# Patient Record
Sex: Female | Born: 1945 | Race: White | Hispanic: No | Marital: Married | Smoking: Never smoker
Health system: Southern US, Community
[De-identification: ages and names within clinical notes are randomized; demographics above are authoritative.]

## PROBLEM LIST (undated history)

## (undated) DIAGNOSIS — M1990 Unspecified osteoarthritis, unspecified site: Secondary | ICD-10-CM

## (undated) DIAGNOSIS — Z923 Personal history of irradiation: Secondary | ICD-10-CM

## (undated) DIAGNOSIS — R0600 Dyspnea, unspecified: Secondary | ICD-10-CM

## (undated) DIAGNOSIS — K529 Noninfective gastroenteritis and colitis, unspecified: Secondary | ICD-10-CM

## (undated) DIAGNOSIS — C50919 Malignant neoplasm of unspecified site of unspecified female breast: Secondary | ICD-10-CM

## (undated) DIAGNOSIS — K219 Gastro-esophageal reflux disease without esophagitis: Secondary | ICD-10-CM

## (undated) DIAGNOSIS — M8580 Other specified disorders of bone density and structure, unspecified site: Secondary | ICD-10-CM

## (undated) DIAGNOSIS — K5190 Ulcerative colitis, unspecified, without complications: Secondary | ICD-10-CM

## (undated) DIAGNOSIS — R1310 Dysphagia, unspecified: Secondary | ICD-10-CM

## (undated) DIAGNOSIS — L719 Rosacea, unspecified: Secondary | ICD-10-CM

## (undated) DIAGNOSIS — K222 Esophageal obstruction: Secondary | ICD-10-CM

## (undated) DIAGNOSIS — J189 Pneumonia, unspecified organism: Secondary | ICD-10-CM

## (undated) DIAGNOSIS — Z8742 Personal history of other diseases of the female genital tract: Secondary | ICD-10-CM

## (undated) HISTORY — PX: ESOPHAGEAL DILATION: SHX303

## (undated) HISTORY — DX: Personal history of other diseases of the female genital tract: Z87.42

## (undated) HISTORY — PX: JOINT REPLACEMENT: SHX530

## (undated) HISTORY — DX: Malignant neoplasm of unspecified site of unspecified female breast: C50.919

## (undated) HISTORY — PX: BREAST SURGERY: SHX581

## (undated) HISTORY — PX: EYE SURGERY: SHX253

---

## 1985-10-29 HISTORY — PX: TUBAL LIGATION: SHX77

## 1998-03-21 ENCOUNTER — Other Ambulatory Visit: Admission: RE | Admit: 1998-03-21 | Discharge: 1998-03-21 | Payer: Self-pay | Admitting: Obstetrics and Gynecology

## 1999-06-23 ENCOUNTER — Other Ambulatory Visit: Admission: RE | Admit: 1999-06-23 | Discharge: 1999-06-23 | Payer: Self-pay | Admitting: Obstetrics and Gynecology

## 2000-07-22 ENCOUNTER — Other Ambulatory Visit: Admission: RE | Admit: 2000-07-22 | Discharge: 2000-07-22 | Payer: Self-pay | Admitting: Obstetrics and Gynecology

## 2001-07-24 ENCOUNTER — Other Ambulatory Visit: Admission: RE | Admit: 2001-07-24 | Discharge: 2001-07-24 | Payer: Self-pay | Admitting: Obstetrics and Gynecology

## 2001-09-17 ENCOUNTER — Ambulatory Visit: Admission: RE | Admit: 2001-09-17 | Discharge: 2001-09-17 | Payer: Self-pay | Admitting: Internal Medicine

## 2002-09-07 ENCOUNTER — Other Ambulatory Visit: Admission: RE | Admit: 2002-09-07 | Discharge: 2002-09-07 | Payer: Self-pay | Admitting: Obstetrics and Gynecology

## 2003-03-16 ENCOUNTER — Ambulatory Visit (HOSPITAL_BASED_OUTPATIENT_CLINIC_OR_DEPARTMENT_OTHER): Admission: RE | Admit: 2003-03-16 | Discharge: 2003-03-16 | Payer: Self-pay | Admitting: Otolaryngology

## 2003-09-13 ENCOUNTER — Other Ambulatory Visit: Admission: RE | Admit: 2003-09-13 | Discharge: 2003-09-13 | Payer: Self-pay | Admitting: Obstetrics and Gynecology

## 2005-02-26 ENCOUNTER — Ambulatory Visit: Payer: Self-pay | Admitting: Internal Medicine

## 2005-04-19 ENCOUNTER — Ambulatory Visit: Payer: Self-pay | Admitting: Internal Medicine

## 2012-01-01 ENCOUNTER — Other Ambulatory Visit: Payer: Self-pay | Admitting: Allergy and Immunology

## 2012-01-01 DIAGNOSIS — R1310 Dysphagia, unspecified: Secondary | ICD-10-CM

## 2012-01-03 ENCOUNTER — Other Ambulatory Visit (HOSPITAL_COMMUNITY): Payer: Self-pay

## 2012-01-04 ENCOUNTER — Ambulatory Visit
Admission: RE | Admit: 2012-01-04 | Discharge: 2012-01-04 | Disposition: A | Discharge status: Home / Self Care | Payer: 59 | Source: Ambulatory Visit | Attending: Allergy and Immunology | Admitting: Allergy and Immunology

## 2012-01-07 ENCOUNTER — Other Ambulatory Visit: Payer: Self-pay | Admitting: Allergy and Immunology

## 2012-01-07 DIAGNOSIS — R531 Weakness: Secondary | ICD-10-CM

## 2012-01-08 ENCOUNTER — Other Ambulatory Visit (HOSPITAL_COMMUNITY): Payer: Self-pay | Admitting: Allergy and Immunology

## 2012-01-08 DIAGNOSIS — J189 Pneumonia, unspecified organism: Secondary | ICD-10-CM

## 2012-01-09 ENCOUNTER — Ambulatory Visit (HOSPITAL_COMMUNITY)
Admission: RE | Admit: 2012-01-09 | Discharge: 2012-01-09 | Disposition: A | Discharge status: Home / Self Care | Payer: 59 | Source: Ambulatory Visit | Attending: Allergy and Immunology | Admitting: Allergy and Immunology

## 2012-01-09 DIAGNOSIS — Z79899 Other long term (current) drug therapy: Secondary | ICD-10-CM | POA: Insufficient documentation

## 2012-01-09 DIAGNOSIS — J189 Pneumonia, unspecified organism: Secondary | ICD-10-CM

## 2012-01-09 DIAGNOSIS — R1313 Dysphagia, pharyngeal phase: Secondary | ICD-10-CM | POA: Insufficient documentation

## 2012-01-09 NOTE — Procedures (Signed)
Modified Barium Swallow Procedure Note Patient Details  Name: Nashea Chumney MRN: [id number] Date of Birth: 06-Mar-1946  Today's Date: 01/09/2012 Time:0950  - 1110    Past Medical History: No past medical history on file. Past Surgical History: No past surgical history on file. HPI:  66 yo female referred by Dr Neldon Mc for MBS due to pt aspirating on esophagram and pt complaint of dysphagia.  Pt reports problems swallowing with insidious progression since Dec 2012.  Pt complains of po intake coming back up and reported that she has been told she has persistent pna per CXR last week.  Weight loss of 11 pounds reported since January 2013.  PMH + for asthma (2nd hand smoke exposure and dust from work in [employer] as a child) , colitis, osteopenia, recent pna s/p ABX tx .  Pt also reports being seen by Dr Redmond Baseman with no significant findings from visualization with mirror.  Pt acknowledges taking Omeprazole  (changed from Nexium d/t cost).  Pt complains of excessive mucus production and acknowledges liquids will come back up with mucus when trying to drink.  Pt reportedly tolerates thicker drinks (ie sweet tea) and foods better than thin liquids, ie water.  Pt take small bites/avoids particulate foods.  She has required the heimlich manuever twice in the last two years.  Pt also complained of voice changes, increase hoarseness as day progresses.     Pt denies neurological history nor unilateral weakness.   Pt ambulated fine and had no focal cranial nerve deficits from oral motor exam.      Recommendation/Prognosis  Clinical Impression Dysphagia Diagnosis: Severe pharyngeal phase dysphagia;Mild cervical esophageal phase dysphagia Clinical impression: Pt presents with severe pharyngeal phase dysphagia primarily sensory based (delay) with OVERT SILENT aspiration of all liquids tested (mixed with secretions).  Various postures (chin tuck, head extended) and liquid consistencies tested did not consistently  prevent aspiration or penetration, but chin tuck appeared to decr amount.  Pt was not able to fully clear aspirates due to depth of aspiration.  Pt's pharyngeal swalllow actually appears strong without significant residuals.  Pt did not aspirate or penetrate pudding or cracker, and this appears c/w her report of better tolerance of food.  Her dysphagia appears to be consistent with neurobased issues.   Recommend pt not consume liquids with meals and masticate food thoroughly due to severity of dysphagia.  Consuming several small meals throughout the day may be beneficial as well.  Rec pt consume thin water between meals (*without other drink or food) after oral care to stay hydrated, as lung clearance of plain water will be better than thickened drinks or other liquids.  Unfortuantely pt's silent aspiration makes it difficult to assess her tolerance of po intake.  Pt is also taking only 1/2 tsp size bites due to her dysphagia, which raises continued concern for her to maintain her weight.   Pt stated it takes her an hour to eat a meal.   Pt stated she is scheduled for an endo on Monday and brain MRI on Tuesday of next week.  Of note, as testing progressed pt appeared with increased vocal hoarseness, presumably from aspiration and cued coughing.  SLP advised pt that family should know Heimlich manuever for emergent use.  Depending on source of dysphagia, weight loss, recurrent pnas, SLP questions if pt may benefit from consideration for alternative means of nutrition with supplemental po.  Thanks for this referral.    Swallow Evaluation Recommendations General Recommendation: Free water protocol after oral  care Solid Consistency: Dysphagia 3 (Mechanical soft) (no liquids with meals) Supervision: Patient able to self feed Compensations: Small sips/bites;Slow rate Postural Changes and/or Swallow Maneuvers: Upright 30-60 min after meal;Chin tuck Oral Care Recommendations: Oral care QID (pt aspirating secretions  currently)   Individuals Consulted Consulted and Agree with Results and Recommendations: Patient Report Sent to : Referring physician;Other (comment) (pt requested Dr Marigene Ehlers Lyndel Safe receive a copy)   General:  Date of Onset: 10/11/11 HPI: 66 yo female referred by Dr Neldon Mc for MBS due to pt aspirating on esophagram and pt complaint of dysphagia.  Pt reports problems swallowing with insidious progression since Dec 2012.  Pt complains of po intake coming back up and reported that she has been told she has persistent pna per CXR last week.  Weight loss of 11 pounds reported since January 2013.  PMH + for asthma (2nd hand smoke exposure and dust from work in [employer] as a child) , colitis, osteopenia, recent pna s/p ABX tx .  Pt also reports being seen by Dr Redmond Baseman with no significant findings from visualization with mirror.  Pt acknowledges taking Omeprazole  (changed from Nexium d/t cost).  Pt complains of excessive mucus production and acknowledges liquids will come back up with mucus when trying to drink.  Pt reportedly tolerates thicker drinks (ie sweet tea) and foods better than thin liquids, ie water.  Pt take small bites/avoids particulate foods.  She has required the heimlich manuever twice in the last two years.  Pt also complained of voice changes, increase hoarseness as day progresses.     Type of Study: Initial MBS Diet Prior to this Study: Other (Comment);Regular (avoiding particulate foods) Respiratory Status: Room air Behavior/Cognition: Alert;Cooperative Oral Cavity - Dentition: Adequate natural dentition Oral Motor / Sensory Function: Within functional limits Vision: Functional for self-feeding Baseline Vocal Quality: Hoarse Volitional Cough: Strong Volitional Swallow: Unable to elicit (xerostomic)  Reason for Referral:  Aspiration on barium swallow  Oral Phase Oral Preparation/Oral Phase Oral Phase: WFL Pharyngeal Phase  Pharyngeal Phase Pharyngeal Phase:  Impaired Pharyngeal - Honey Pharyngeal - Honey Cup: Delayed swallow initiation;Penetration/Aspiration during swallow;Penetration/Aspiration before swallow Penetration/Aspiration details (honey cup): Material enters airway, passes BELOW cords without attempt by patient to eject out (silent aspiration) Pharyngeal - Nectar Pharyngeal - Nectar Cup: Delayed swallow initiation;Penetration/Aspiration during swallow;Penetration/Aspiration before swallow Penetration/Aspiration details (nectar cup): Material enters airway, passes BELOW cords without attempt by patient to eject out (silent aspiration) Pharyngeal - Thin Pharyngeal - Thin Teaspoon: Delayed swallow initiation;Penetration/Aspiration before swallow;Penetration/Aspiration during swallow Penetration/Aspiration details (thin teaspoon): Material enters airway, passes BELOW cords without attempt by patient to eject out (silent aspiration) Pharyngeal - Thin Cup: Delayed swallow initiation;Penetration/Aspiration before swallow;Penetration/Aspiration during swallow Penetration/Aspiration details (thin cup): Material enters airway, passes BELOW cords without attempt by patient to eject out (silent aspiration) Pharyngeal - Thin Straw: Delayed swallow initiation;Penetration/Aspiration before swallow;Penetration/Aspiration during swallow Penetration/Aspiration details (thin straw): Material enters airway, passes BELOW cords without attempt by patient to eject out (silent aspiration) Pharyngeal - Solids Pharyngeal - Puree: Delayed swallow initiation;Premature spillage to valleculae;Compensatory strategies attempted (Comment) (pt took 1/2 tsp bolus size only) Pharyngeal - Regular: Delayed swallow initiation;Premature spillage to valleculae;Compensatory strategies attempted (Comment) (hesitation initiation with bolus spilling over epiglottis) Pharyngeal Phase - Comment Pharyngeal Comment: chin tuck posture did not consistently prevent aspiration or penetration,  inconsistently appeared to decr amount of aspiration, head elevation allowed posterior tracheal aspiration,  supraglottic swallow not attempted d/t pt's asthma Cervical Esophageal Phase  Cervical Esophageal Phase Cervical  Esophageal Phase: Impaired Cervical Esophageal Phase - Honey Honey Teaspoon: Prominent cricopharyngeal segment Cervical Esophageal Phase - Nectar Nectar Cup: Prominent cricopharyngeal segment Cervical Esophageal Phase - Thin Thin Teaspoon: Prominent cricopharyngeal segment Thin Cup: Prominent cricopharyngeal segment Thin Straw: Prominent cricopharyngeal segment Cervical Esophageal Phase - Solids Puree: Prominent cricopharyngeal segment Regular: Prominent cricopharyngeal segment Cervical Esophageal Phase - Comment Cervical Esophageal Comment: appearance of prominent cricophayrngeus with minimal amount of barium below UES (with pudding/cracker) WITHOUT pt awareness, liquid bolus consumption appeared to aid clearance but with overt aspiration of liquids   Luanna Salk, [facility] SLP [phone]

## 2012-01-15 ENCOUNTER — Ambulatory Visit
Admission: RE | Admit: 2012-01-15 | Discharge: 2012-01-15 | Disposition: A | Discharge status: Home / Self Care | Payer: 59 | Source: Ambulatory Visit | Attending: Allergy and Immunology | Admitting: Allergy and Immunology

## 2012-01-15 DIAGNOSIS — R531 Weakness: Secondary | ICD-10-CM

## 2012-01-15 MED ORDER — GADOBENATE DIMEGLUMINE 529 MG/ML IV SOLN
10.0000 mL | Freq: Once | INTRAVENOUS | Status: AC | PRN
  Administered 2012-01-15: 10 mL via INTRAVENOUS

## 2012-01-24 ENCOUNTER — Other Ambulatory Visit: Payer: Self-pay | Admitting: Neurosurgery

## 2012-01-31 ENCOUNTER — Encounter (HOSPITAL_COMMUNITY): Payer: Self-pay | Admitting: Pharmacy Technician

## 2012-02-04 MED ORDER — DEXAMETHASONE SODIUM PHOSPHATE 10 MG/ML IJ SOLN
INTRAMUSCULAR | Status: AC
  Filled 2012-02-04: qty 1

## 2012-02-06 ENCOUNTER — Encounter (HOSPITAL_COMMUNITY)
Admission: RE | Admit: 2012-02-06 | Discharge: 2012-02-06 | Disposition: A | Discharge status: Home / Self Care | Payer: 59 | Source: Ambulatory Visit | Attending: Neurosurgery | Admitting: Neurosurgery

## 2012-02-06 ENCOUNTER — Encounter (HOSPITAL_COMMUNITY)
Admission: RE | Admit: 2012-02-06 | Discharge: 2012-02-06 | Disposition: A | Discharge status: Home / Self Care | Payer: 59 | Source: Ambulatory Visit | Attending: Anesthesiology | Admitting: Anesthesiology

## 2012-02-06 ENCOUNTER — Encounter (HOSPITAL_COMMUNITY): Payer: Self-pay

## 2012-02-06 HISTORY — DX: Esophageal obstruction: K22.2

## 2012-02-06 HISTORY — DX: Pneumonia, unspecified organism: J18.9

## 2012-02-06 HISTORY — DX: Gastro-esophageal reflux disease without esophagitis: K21.9

## 2012-02-06 HISTORY — DX: Other specified disorders of bone density and structure, unspecified site: M85.80

## 2012-02-06 HISTORY — DX: Noninfective gastroenteritis and colitis, unspecified: K52.9

## 2012-02-06 LAB — CBC
Platelets: 256 10*3/uL (ref 150–400)
RBC: 4.73 MIL/uL (ref 3.87–5.11)
RDW: 13.8 % (ref 11.5–15.5)
WBC: 7.2 10*3/uL (ref 4.0–10.5)

## 2012-02-06 LAB — BASIC METABOLIC PANEL
Calcium: 9.4 mg/dL (ref 8.4–10.5)
Creatinine, Ser: 0.66 mg/dL (ref 0.50–1.10)
GFR calc Af Amer: >90 mL/min (ref >90)
GFR calc non Af Amer: >90 mL/min (ref >90)
Sodium: 137 mEq/L (ref 135–145)

## 2012-02-06 LAB — TYPE AND SCREEN
ABO/RH(D): O POS
Antibody Screen: NEGATIVE

## 2012-02-06 LAB — SURGICAL PCR SCREEN: MRSA, PCR: NEGATIVE

## 2012-02-06 LAB — ABO/RH: ABO/RH(D): O POS

## 2012-02-06 NOTE — Pre-Procedure Instructions (Signed)
[place]  02/06/2012   Your procedure is scheduled on:  April 17  Report to [place] at 0830 AM.  Call this number if you have problems the morning of surgery: [phone]   Remember:   Do not eat food:After Midnight.  May have clear liquids: up to 4 Hours before arrival.  Clear liquids include soda, tea, black coffee, apple or grape juice, broth.  Take these medicines the morning of surgery with A SIP OF WATER: Xopenex (if needed), omeprazole, Dulera   Stop Calcium with Vitamin D, Vitamin D, Multiple Vitamins, and Vitamin E after today  Do not wear jewelry, make-up or nail polish.  Do not wear lotions, powders, or perfumes. You may wear deodorant.  Do not shave 48 hours prior to surgery.  Do not bring valuables to the hospital.  Contacts, dentures or bridgework may not be worn into surgery.  Leave suitcase in the car. After surgery it may be brought to your room.  For patients admitted to the hospital, checkout time is 11:00 AM the day of discharge.   Special Instructions: CHG Shower Use Special Wash: 1/2 bottle night before surgery and 1/2 bottle morning of surgery.   Please read over the following fact sheets that you were given: Pain Booklet, Coughing and Deep Breathing, Blood Transfusion Information and Surgical Site Infection Prevention

## 2012-02-12 MED ORDER — VANCOMYCIN HCL IN DEXTROSE 1-5 GM/200ML-% IV SOLN
1000.0000 mg | INTRAVENOUS | Status: AC
  Administered 2012-02-13: 1000 mg via INTRAVENOUS
  Filled 2012-02-12: qty 200

## 2012-02-12 NOTE — Progress Notes (Signed)
Spoke with Dr. Bruna Potter office regarding obtaining records from office visit on 02/12/12 they report that it will not be back in the office for two weeks

## 2012-02-12 NOTE — Progress Notes (Addendum)
1628pm..Marland KitchenTuesday..  Saw note on front of chart to call Dr. Bruna Potter office for office note.  His transcription will not be ready for 3 days... He will send a written note to 'clearance'  To  [phone].... PLEASE CHECK THE ABOVE FAX..  I TRIED THIS AFTERNOON 1640 AND NOTHING HAS COME YET

## 2012-02-13 ENCOUNTER — Encounter (HOSPITAL_COMMUNITY): Payer: Self-pay | Admitting: Certified Registered Nurse Anesthetist

## 2012-02-13 ENCOUNTER — Encounter (HOSPITAL_COMMUNITY): Payer: Self-pay | Admitting: Anesthesiology

## 2012-02-13 ENCOUNTER — Encounter (HOSPITAL_COMMUNITY)
Admission: RE | Disposition: A | Discharge status: Home / Self Care | Payer: Self-pay | Source: Ambulatory Visit | Attending: Neurosurgery

## 2012-02-13 ENCOUNTER — Ambulatory Visit (HOSPITAL_COMMUNITY): Payer: 59 | Admitting: Anesthesiology

## 2012-02-13 ENCOUNTER — Inpatient Hospital Stay (HOSPITAL_COMMUNITY)
Admission: RE | Admit: 2012-02-13 | Discharge: 2012-02-16 | DRG: 030 | Disposition: A | Discharge status: Home / Self Care | Payer: 59 | Source: Ambulatory Visit | Attending: Neurosurgery | Admitting: Neurosurgery

## 2012-02-13 ENCOUNTER — Encounter (HOSPITAL_COMMUNITY): Payer: Self-pay | Admitting: *Deleted

## 2012-02-13 DIAGNOSIS — M899 Disorder of bone, unspecified: Secondary | ICD-10-CM | POA: Diagnosis present

## 2012-02-13 DIAGNOSIS — Z01812 Encounter for preprocedural laboratory examination: Secondary | ICD-10-CM

## 2012-02-13 DIAGNOSIS — J45909 Unspecified asthma, uncomplicated: Secondary | ICD-10-CM | POA: Diagnosis present

## 2012-02-13 DIAGNOSIS — K219 Gastro-esophageal reflux disease without esophagitis: Secondary | ICD-10-CM | POA: Diagnosis present

## 2012-02-13 DIAGNOSIS — G935 Compression of brain: Principal | ICD-10-CM | POA: Diagnosis present

## 2012-02-13 DIAGNOSIS — R1310 Dysphagia, unspecified: Secondary | ICD-10-CM | POA: Diagnosis present

## 2012-02-13 DIAGNOSIS — Z01818 Encounter for other preprocedural examination: Secondary | ICD-10-CM

## 2012-02-13 HISTORY — PX: SUBOCCIPITAL CRANIECTOMY CERVICAL LAMINECTOMY: SHX5404

## 2012-02-13 SURGERY — SUBOCCIPITAL CRANIECTOMY CERVICAL LAMINECTOMY/DURAPLASTY
Anesthesia: General | Wound class: Clean

## 2012-02-13 MED ORDER — MORPHINE SULFATE 2 MG/ML IJ SOLN: 0.0500 mg/kg | INTRAMUSCULAR | Status: DC | PRN

## 2012-02-13 MED ORDER — PANTOPRAZOLE SODIUM 40 MG IV SOLR
40.0000 mg | Freq: Every day | INTRAVENOUS | Status: DC
  Filled 2012-02-13 (×2): qty 40

## 2012-02-13 MED ORDER — MORPHINE SULFATE (PF) 1 MG/ML IV SOLN
INTRAVENOUS | Status: DC
  Administered 2012-02-13: 25 mg via INTRAVENOUS
  Administered 2012-02-13: 5 mg via INTRAVENOUS
  Administered 2012-02-13: 3 mg via INTRAVENOUS
  Administered 2012-02-14: 25 mg via INTRAVENOUS
  Administered 2012-02-14: 4.5 mg via INTRAVENOUS

## 2012-02-13 MED ORDER — PANTOPRAZOLE SODIUM 40 MG PO TBEC: 80.0000 mg | DELAYED_RELEASE_TABLET | Freq: Every day | ORAL | Status: DC

## 2012-02-13 MED ORDER — LIDOCAINE HCL (CARDIAC) 20 MG/ML IV SOLN
INTRAVENOUS | Status: DC | PRN
  Administered 2012-02-13: 80 mg via INTRAVENOUS

## 2012-02-13 MED ORDER — MORPHINE SULFATE (PF) 1 MG/ML IV SOLN
INTRAVENOUS | Status: AC
  Administered 2012-02-13: 15:00:00
  Filled 2012-02-13: qty 25

## 2012-02-13 MED ORDER — 0.9 % SODIUM CHLORIDE (POUR BTL) OPTIME
TOPICAL | Status: DC | PRN
  Administered 2012-02-13: 1000 mL

## 2012-02-13 MED ORDER — PHENOL 1.4 % MT LIQD: 1.0000 | OROMUCOSAL | Status: DC | PRN

## 2012-02-13 MED ORDER — PROPOFOL 10 MG/ML IV EMUL
INTRAVENOUS | Status: DC | PRN
  Administered 2012-02-13: 170 mg via INTRAVENOUS
  Administered 2012-02-13: 30 mg via INTRAVENOUS

## 2012-02-13 MED ORDER — FAMOTIDINE 20 MG PO TABS: 20.0000 mg | ORAL_TABLET | Freq: Every day | ORAL | Status: DC

## 2012-02-13 MED ORDER — DEXAMETHASONE SODIUM PHOSPHATE 10 MG/ML IJ SOLN
INTRAMUSCULAR | Status: AC
  Administered 2012-02-13: 10 mg via INTRAVENOUS
  Filled 2012-02-13: qty 1

## 2012-02-13 MED ORDER — ACETAMINOPHEN 325 MG PO TABS: 650.0000 mg | ORAL_TABLET | ORAL | Status: DC | PRN

## 2012-02-13 MED ORDER — ONDANSETRON HCL 4 MG/2ML IJ SOLN
4.0000 mg | Freq: Once | INTRAMUSCULAR | Status: DC | PRN
Start: 2012-02-13 — End: 2012-02-13

## 2012-02-13 MED ORDER — ONDANSETRON HCL 4 MG/2ML IJ SOLN: 4.0000 mg | Freq: Four times a day (QID) | INTRAMUSCULAR | Status: DC | PRN

## 2012-02-13 MED ORDER — MOMETASONE FURO-FORMOTEROL FUM 100-5 MCG/ACT IN AERO
2.0000 | INHALATION_SPRAY | Freq: Two times a day (BID) | RESPIRATORY_TRACT | Status: DC
  Administered 2012-02-13 – 2012-02-15 (×4): 2 via RESPIRATORY_TRACT
  Filled 2012-02-13: qty 13

## 2012-02-13 MED ORDER — ONDANSETRON HCL 4 MG/2ML IJ SOLN
4.0000 mg | INTRAMUSCULAR | Status: DC | PRN
  Administered 2012-02-13 – 2012-02-15 (×3): 4 mg via INTRAVENOUS
  Filled 2012-02-13 (×3): qty 2

## 2012-02-13 MED ORDER — DOCUSATE SODIUM 100 MG PO CAPS
100.0000 mg | ORAL_CAPSULE | Freq: Two times a day (BID) | ORAL | Status: DC
  Administered 2012-02-13 – 2012-02-16 (×7): 100 mg via ORAL
  Filled 2012-02-13 (×7): qty 1

## 2012-02-13 MED ORDER — BUPIVACAINE-EPINEPHRINE 0.5% -1:200000 IJ SOLN
INTRAMUSCULAR | Status: DC | PRN
  Administered 2012-02-13: 10 mL

## 2012-02-13 MED ORDER — MORPHINE SULFATE (PF) 1 MG/ML IV SOLN
INTRAVENOUS | Status: AC
  Administered 2012-02-13: 25 mg via INTRAVENOUS
  Filled 2012-02-13: qty 25

## 2012-02-13 MED ORDER — TAB-A-VITE/IRON PO TABS
1.0000 | ORAL_TABLET | Freq: Every day | ORAL | Status: DC
  Administered 2012-02-13 – 2012-02-16 (×4): 1 via ORAL
  Filled 2012-02-13 (×4): qty 1

## 2012-02-13 MED ORDER — OXYCODONE-ACETAMINOPHEN 5-325 MG PO TABS
1.0000 | ORAL_TABLET | ORAL | Status: DC | PRN
  Administered 2012-02-16: 2 via ORAL
  Filled 2012-02-13: qty 2

## 2012-02-13 MED ORDER — NEOSTIGMINE METHYLSULFATE 1 MG/ML IJ SOLN
INTRAMUSCULAR | Status: DC | PRN
  Administered 2012-02-13: 3 mg via INTRAVENOUS

## 2012-02-13 MED ORDER — VANCOMYCIN HCL IN DEXTROSE 1-5 GM/200ML-% IV SOLN
1000.0000 mg | Freq: Once | INTRAVENOUS | Status: AC
  Administered 2012-02-14: 1000 mg via INTRAVENOUS
  Filled 2012-02-13: qty 200

## 2012-02-13 MED ORDER — NALOXONE HCL 0.4 MG/ML IJ SOLN: 0.4000 mg | INTRAMUSCULAR | Status: DC | PRN

## 2012-02-13 MED ORDER — ROCURONIUM BROMIDE 100 MG/10ML IV SOLN
INTRAVENOUS | Status: DC | PRN
  Administered 2012-02-13: 20 mg via INTRAVENOUS
  Administered 2012-02-13: 50 mg via INTRAVENOUS

## 2012-02-13 MED ORDER — SODIUM CHLORIDE 0.9 % IV SOLN
INTRAVENOUS | Status: AC
  Filled 2012-02-13: qty 500

## 2012-02-13 MED ORDER — MONTELUKAST SODIUM 10 MG PO TABS
10.0000 mg | ORAL_TABLET | Freq: Every day | ORAL | Status: DC
  Administered 2012-02-13 – 2012-02-15 (×3): 10 mg via ORAL
  Filled 2012-02-13 (×4): qty 1

## 2012-02-13 MED ORDER — HYDROMORPHONE HCL PF 1 MG/ML IJ SOLN: 0.2500 mg | INTRAMUSCULAR | Status: DC | PRN

## 2012-02-13 MED ORDER — HYDROCODONE-ACETAMINOPHEN 5-325 MG PO TABS
1.0000 | ORAL_TABLET | ORAL | Status: DC | PRN
  Administered 2012-02-14 – 2012-02-15 (×3): 1 via ORAL
  Administered 2012-02-15: 2 via ORAL
  Administered 2012-02-15 (×2): 1 via ORAL
  Administered 2012-02-16: 2 via ORAL
  Filled 2012-02-13 (×3): qty 1
  Filled 2012-02-13: qty 2
  Filled 2012-02-13 (×2): qty 1
  Filled 2012-02-13: qty 2

## 2012-02-13 MED ORDER — BACITRACIN ZINC 500 UNIT/GM EX OINT
TOPICAL_OINTMENT | CUTANEOUS | Status: DC | PRN
  Administered 2012-02-13: 1 via TOPICAL

## 2012-02-13 MED ORDER — LACTATED RINGERS IV SOLN
INTRAVENOUS | Status: DC
  Administered 2012-02-14: 04:00:00 via INTRAVENOUS

## 2012-02-13 MED ORDER — FENTANYL CITRATE 0.05 MG/ML IJ SOLN
INTRAMUSCULAR | Status: DC | PRN
  Administered 2012-02-13: 100 ug via INTRAVENOUS
  Administered 2012-02-13 (×2): 50 ug via INTRAVENOUS

## 2012-02-13 MED ORDER — CEFAZOLIN SODIUM-DEXTROSE 2-3 GM-% IV SOLR
2.0000 g | Freq: Three times a day (TID) | INTRAVENOUS | Status: DC
  Filled 2012-02-13 (×2): qty 50

## 2012-02-13 MED ORDER — PHENYLEPHRINE HCL 10 MG/ML IJ SOLN
INTRAMUSCULAR | Status: DC | PRN
  Administered 2012-02-13 (×2): 80 ug via INTRAVENOUS

## 2012-02-13 MED ORDER — PHENYLEPHRINE HCL 10 MG/ML IJ SOLN
10.0000 mg | INTRAVENOUS | Status: DC | PRN
  Administered 2012-02-13: 25 ug/min via INTRAVENOUS

## 2012-02-13 MED ORDER — DIPHENHYDRAMINE HCL 12.5 MG/5ML PO ELIX
12.5000 mg | ORAL_SOLUTION | Freq: Four times a day (QID) | ORAL | Status: DC | PRN
  Filled 2012-02-13: qty 5

## 2012-02-13 MED ORDER — DIPHENHYDRAMINE HCL 50 MG/ML IJ SOLN: 12.5000 mg | Freq: Four times a day (QID) | INTRAMUSCULAR | Status: DC | PRN

## 2012-02-13 MED ORDER — SODIUM CHLORIDE 0.9 % IR SOLN
Status: DC | PRN
  Administered 2012-02-13: 12:00:00

## 2012-02-13 MED ORDER — THROMBIN 20000 UNITS EX KIT
PACK | CUTANEOUS | Status: DC | PRN
  Administered 2012-02-13: 12:00:00 via TOPICAL

## 2012-02-13 MED ORDER — BACITRACIN 50000 UNITS IM SOLR
INTRAMUSCULAR | Status: AC
  Filled 2012-02-13: qty 1

## 2012-02-13 MED ORDER — SODIUM CHLORIDE 0.9 % IJ SOLN: 9.0000 mL | INTRAMUSCULAR | Status: DC | PRN

## 2012-02-13 MED ORDER — SODIUM CHLORIDE 0.9 % IV SOLN
INTRAVENOUS | Status: DC | PRN
  Administered 2012-02-13 (×2): via INTRAVENOUS

## 2012-02-13 MED ORDER — ACETAMINOPHEN 650 MG RE SUPP: 650.0000 mg | RECTAL | Status: DC | PRN

## 2012-02-13 MED ORDER — ALUM & MAG HYDROXIDE-SIMETH 200-200-20 MG/5ML PO SUSP: 30.0000 mL | Freq: Four times a day (QID) | ORAL | Status: DC | PRN

## 2012-02-13 MED ORDER — ONDANSETRON HCL 4 MG/2ML IJ SOLN
INTRAMUSCULAR | Status: DC | PRN
  Administered 2012-02-13: 4 mg via INTRAVENOUS

## 2012-02-13 MED ORDER — SODIUM CHLORIDE 0.9 % IV SOLN
INTRAVENOUS | Status: DC | PRN
  Administered 2012-02-13 (×2): via INTRAVENOUS

## 2012-02-13 MED ORDER — ZOLPIDEM TARTRATE 10 MG PO TABS
10.0000 mg | ORAL_TABLET | Freq: Every evening | ORAL | Status: DC | PRN
  Administered 2012-02-13: 10 mg via ORAL
  Filled 2012-02-13 (×2): qty 2

## 2012-02-13 MED ORDER — MENTHOL 3 MG MT LOZG: 1.0000 | LOZENGE | OROMUCOSAL | Status: DC | PRN

## 2012-02-13 MED ORDER — LEVALBUTEROL TARTRATE 45 MCG/ACT IN AERO
1.0000 | INHALATION_SPRAY | RESPIRATORY_TRACT | Status: DC | PRN
  Filled 2012-02-13: qty 15

## 2012-02-13 MED ORDER — MESALAMINE 1.2 G PO TBEC
1200.0000 mg | DELAYED_RELEASE_TABLET | Freq: Every day | ORAL | Status: DC
  Filled 2012-02-13 (×2): qty 1

## 2012-02-13 MED ORDER — GLYCOPYRROLATE 0.2 MG/ML IJ SOLN
INTRAMUSCULAR | Status: DC | PRN
  Administered 2012-02-13: 0.4 mg via INTRAVENOUS

## 2012-02-13 SURGICAL SUPPLY — 80 items
APL SKNCLS STERI-STRIP NONHPOA (GAUZE/BANDAGES/DRESSINGS) ×2
BAG DECANTER FOR FLEXI CONT (MISCELLANEOUS) ×2 IMPLANT
BENZOIN TINCTURE PRP APPL 2/3 (GAUZE/BANDAGES/DRESSINGS) ×4 IMPLANT
BIT DRILL NEURO 2X3.1 SFT TUCH (MISCELLANEOUS) IMPLANT
BLADE CLIPPER SURG NEURO (BLADE) ×3 IMPLANT
BLADE SURG 15 STRL LF DISP TIS (BLADE) ×1 IMPLANT
BLADE SURG 15 STRL SS (BLADE) ×2
BLADE ULTRA TIP 2M (BLADE) IMPLANT
BRUSH SCRUB EZ 1% IODOPHOR (MISCELLANEOUS) ×1 IMPLANT
BRUSH SCRUB EZ PLAIN DRY (MISCELLANEOUS) ×2 IMPLANT
BUR ACORN 6.0 PRECISION (BURR) ×2 IMPLANT
CANISTER SUCTION 2500CC (MISCELLANEOUS) ×2 IMPLANT
CLIP TI MEDIUM 6 (CLIP) IMPLANT
CLOTH BEACON ORANGE TIMEOUT ST (SAFETY) ×2 IMPLANT
CONT SPEC 4OZ CLIKSEAL STRL BL (MISCELLANEOUS) ×2 IMPLANT
CORDS BIPOLAR (ELECTRODE) ×2 IMPLANT
COVER MAYO STAND STRL (DRAPES) ×2 IMPLANT
DRAIN SNY WOU 7FLT (WOUND CARE) IMPLANT
DRAPE LAPAROTOMY 100X72 PEDS (DRAPES) ×2 IMPLANT
DRAPE MICROSCOPE ZEISS OPMI (DRAPES) IMPLANT
DRAPE SURG 17X23 STRL (DRAPES) ×4 IMPLANT
DRAPE WARM FLUID 44X44 (DRAPE) ×1 IMPLANT
DRESSING TELFA 8X3 (GAUZE/BANDAGES/DRESSINGS) ×1 IMPLANT
DRILL NEURO 2X3.1 SOFT TOUCH (MISCELLANEOUS) ×2
DURAGUARD 04CMX04CM ×1 IMPLANT
DURASEAL SPINE SEALANT 3ML (MISCELLANEOUS) ×1 IMPLANT
ELECT CAUTERY BLADE 6.4 (BLADE) ×3 IMPLANT
ELECT REM PT RETURN 9FT ADLT (ELECTROSURGICAL) ×2
ELECTRODE REM PT RTRN 9FT ADLT (ELECTROSURGICAL) ×1 IMPLANT
EVACUATOR 1/8 PVC DRAIN (DRAIN) IMPLANT
EVACUATOR SILICONE 100CC (DRAIN) IMPLANT
GAUZE SPONGE 4X4 16PLY XRAY LF (GAUZE/BANDAGES/DRESSINGS) IMPLANT
GLOVE BIO SURGEON STRL SZ8.5 (GLOVE) ×2 IMPLANT
GLOVE BIOGEL PI IND STRL 7.5 (GLOVE) IMPLANT
GLOVE BIOGEL PI IND STRL 8 (GLOVE) IMPLANT
GLOVE BIOGEL PI IND STRL 8.5 (GLOVE) IMPLANT
GLOVE BIOGEL PI INDICATOR 7.5 (GLOVE) ×1
GLOVE BIOGEL PI INDICATOR 8 (GLOVE) ×1
GLOVE BIOGEL PI INDICATOR 8.5 (GLOVE) ×2
GLOVE ECLIPSE 7.5 STRL STRAW (GLOVE) ×4 IMPLANT
GLOVE EXAM NITRILE LRG STRL (GLOVE) IMPLANT
GLOVE EXAM NITRILE MD LF STRL (GLOVE) ×4 IMPLANT
GLOVE EXAM NITRILE XL STR (GLOVE) IMPLANT
GLOVE EXAM NITRILE XS STR PU (GLOVE) IMPLANT
GLOVE SS BIOGEL STRL SZ 8 (GLOVE) ×1 IMPLANT
GLOVE SUPERSENSE BIOGEL SZ 8 (GLOVE) ×1
GLOVE SURG SS PI 8.0 STRL IVOR (GLOVE) ×3 IMPLANT
GOWN BRE IMP SLV AUR LG STRL (GOWN DISPOSABLE) IMPLANT
GOWN BRE IMP SLV AUR XL STRL (GOWN DISPOSABLE) ×2 IMPLANT
GOWN STRL REIN 2XL LVL4 (GOWN DISPOSABLE) ×4 IMPLANT
KIT BASIN OR (CUSTOM PROCEDURE TRAY) ×2 IMPLANT
KIT ROOM TURNOVER OR (KITS) ×2 IMPLANT
MARKER SKIN DUAL TIP RULER LAB (MISCELLANEOUS) ×2 IMPLANT
NEEDLE HYPO 22GX1.5 SAFETY (NEEDLE) ×2 IMPLANT
NS IRRIG 1000ML POUR BTL (IV SOLUTION) ×2 IMPLANT
PACK CRANIOTOMY (CUSTOM PROCEDURE TRAY) ×2 IMPLANT
PAD ARMBOARD 7.5X6 YLW CONV (MISCELLANEOUS) ×6 IMPLANT
PATTIES SURGICAL 1/4 X 3 (GAUZE/BANDAGES/DRESSINGS) IMPLANT
PENCIL BUTTON HOLSTER BLD 10FT (ELECTRODE) ×1 IMPLANT
RUBBERBAND STERILE (MISCELLANEOUS) IMPLANT
SPONGE GAUZE 4X4 12PLY (GAUZE/BANDAGES/DRESSINGS) ×2 IMPLANT
SPONGE LAP 4X18 X RAY DECT (DISPOSABLE) ×1 IMPLANT
STAPLER SKIN PROX WIDE 3.9 (STAPLE) ×2 IMPLANT
STRIP CLOSURE SKIN 1/2X4 (GAUZE/BANDAGES/DRESSINGS) ×2 IMPLANT
SUT ETHILON 2 0 FS 18 (SUTURE) IMPLANT
SUT ETHILON 3 0 FSL (SUTURE) IMPLANT
SUT NURALON 4 0 TR CR/8 (SUTURE) ×4 IMPLANT
SUT PROLENE 6 0 BV (SUTURE) ×7 IMPLANT
SUT VIC AB 1 CT1 18XBRD ANBCTR (SUTURE) ×1 IMPLANT
SUT VIC AB 1 CT1 8-18 (SUTURE) ×2
SUT VIC AB 2-0 CP2 18 (SUTURE) ×3 IMPLANT
SUT VIC AB 3-0 SH 8-18 (SUTURE) ×2 IMPLANT
SYR 20ML ECCENTRIC (SYRINGE) ×2 IMPLANT
SYR CONTROL 10ML LL (SYRINGE) ×2 IMPLANT
TAPE CLOTH SURG 4X10 WHT LF (GAUZE/BANDAGES/DRESSINGS) ×1 IMPLANT
TOWEL OR 17X24 6PK STRL BLUE (TOWEL DISPOSABLE) ×2 IMPLANT
TOWEL OR 17X26 10 PK STRL BLUE (TOWEL DISPOSABLE) ×2 IMPLANT
TRAY FOLEY CATH 14FRSI W/METER (CATHETERS) ×1 IMPLANT
UNDERPAD 30X30 INCONTINENT (UNDERPADS AND DIAPERS) IMPLANT
WATER STERILE IRR 1000ML POUR (IV SOLUTION) ×2 IMPLANT

## 2012-02-13 NOTE — Anesthesia Postprocedure Evaluation (Signed)
  Anesthesia Post-op Note  Patient: Cindy Arias  Procedure(s) Performed: Procedure(s) (LRB): SUBOCCIPITAL CRANIECTOMY CERVICAL LAMINECTOMY/DURAPLASTY (N/A)  Patient Location: PACU  Anesthesia Type: General  Level of Consciousness: awake, alert  and oriented  Airway and Oxygen Therapy: Patient Spontanous Breathing and Patient connected to nasal cannula oxygen  Post-op Pain: mild  Post-op Assessment: Post-op Vital signs reviewed, Patient's Cardiovascular Status Stable, Respiratory Function Stable, Patent Airway, No signs of Nausea or vomiting and Pain level controlled  Post-op Vital Signs: Reviewed and stable  Complications: No apparent anesthesia complications

## 2012-02-13 NOTE — Op Note (Signed)
Brief history: The patient is a 66 year old white female who presents with a history of dysphagia. 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 I discussed the various treatment options with the patient and her family including surgery. The patient has weighed the risks, benefits, and alternatives surgery decided proceed with the Chiari decompression.  Preop diagnosis: Chiari 1 malformation, dysphagia  Postop diagnosis: The same  Procedure: Suboccipital craniectomy with C1 and C2 laminectomy and duraplasty  Surgeon: Dr. Earle Gell  Assistant: Dr. Jovita Gamma  Anesthesia: Gen. endotracheal  Estimated blood loss: 75 cc  Specimens: None  Drains: None  Description of procedure: The patient was brought to the operating room by the anesthesia team. General endotracheal anesthesia was induced. The [place] 3 point headrest was applied to the patient's calvarium. The patient was then turned to the prone position on the chest rolls. Her suboccipital region was then shaved with clippers. Her suboccipital posterior cervical and upper thoracic region was then prepared with Betadine scrub and Betadine solution. Sterile drapes were applied. I then injected the area to be incised with Marcaine with epinephrine solution. I used a scalpel to make a linear midline incision at the occipital cervical junction. I used the cautery perform a bilateral subperiosteal dissection exposing the suboccipital region the C1 and C2 lamina. We inserted the cerebellar retractors for exposure. I used Leksell nodule were to remove the C2 spinous process. I then used a high-speed drill to perform a suboccipital craniectomy and drill away the lamina of C1 and C2. I completed the C1 and C2 laminectomy in the suboccipital craniectomy using the Kerrison punches exposing the underlying dura. We did encounter some fibrous tissue at the foramen magnum. We removed  this using the Kerrison punches. I then used a 15 blade and the [facility] to create a Y-shaped durotomy. We tacked epidural edges with 4-0 Nurolon suture. Then obtained a 4 x 4 piece of dura guard. We cut to the appropriate size. We then performed a duraplasty using interrupted and running 6-0 Prolene sutures. We had anesthesia then bowel sounds the patient. We had a good closure. He then covered the durotomy with DuraSeal. We then removed the retractors and obtained hemostasis using bipolar cautery. We then reapproximated patient's cervical thoracic fascia with interrupted #1 Vicryl suture, we reapproximated the subcutaneous tissues with interrupted 2-0 Vicryl suture. We reapproximated the skin with Steri-Strip and benzoin. The wound was then coated with bacitracin ointment and a sterile dressing was applied. The drapes were removed. The patient was subsequently returned to the supine position and extubated by the anesthesia team and transported to the post anesthesia care unit in stable condition. All sponge instrument and needle counts were correct at the end this case.

## 2012-02-13 NOTE — Progress Notes (Signed)
Patient ID: Cindy Arias, female   DOB: 1946-02-14, 66 y.o.   MRN: [id number] Subjective:  The patient is somnolent but easily arousable. She is in no apparent distress.  Objective: Vital signs in last 24 hours: Temp:  [97.9 F (36.6 C)] 97.9 F (36.6 C) (04/17 0846) Pulse Rate:  [95] 95  (04/17 0846) Resp:  [18] 18  (04/17 0846) BP: (120)/(85) 120/85 mmHg (04/17 0846) SpO2:  [97 %] 97 % (04/17 0846)  Intake/Output from previous day:   Intake/Output this shift: Total I/O In: 3000 [I.V.:3000] Out: 200 [Urine:150; Blood:50]  Physical exam the patient is somnolent but easily arousable. She will follow commands. She is moving all 4 extremities well. Her pupils are equal round reactive light. Her dressing is clean and dry.  Lab Results: No results found for this basename: WBC:2,HGB:2,HCT:2,PLT:2 in the last 72 hours BMET No results found for this basename: NA:2,K:2,CL:2,CO2:2,GLUCOSE:2,BUN:2,CREATININE:2,CALCIUM:2 in the last 72 hours  Studies/Results: No results found.  Assessment/Plan: The patient is doing well.  LOS: 0 days     Kyrollos Cordell D 02/13/2012, 2:11 PM

## 2012-02-13 NOTE — Anesthesia Preprocedure Evaluation (Addendum)
Anesthesia Evaluation  Patient identified by MRN, date of birth, ID band Patient awake    Reviewed: Allergy & Precautions, H&P , NPO status , Patient's Chart, lab work & pertinent test results  History of Anesthesia Complications Negative for: history of anesthetic complications  Airway Mallampati: II TM Distance: >3 FB Neck ROM: Full    Dental  (+) Teeth Intact and Dental Advisory Given   Pulmonary shortness of breath, with exertion and lying, asthma , pneumonia  (resolved aspiration PNA), former smoker Aspiration PNA started in Jan 2013, wasn't dx and tx till March 2013. Pulmonary MD cleared for sx 02/12/2012         Cardiovascular negative cardio ROS  Rhythm:Regular Rate:Normal     Neuro/Psych  Headaches, Chiari I malformation, with dysphagia    GI/Hepatic Neg liver ROS, GERD-  Medicated and Controlled,Esophageal stricture--diliatation March 2013 Colitis   Endo/Other  negative endocrine ROS  Renal/GU negative Renal ROS     Musculoskeletal negative musculoskeletal ROS (+)   Abdominal   Peds  Hematology negative hematology ROS (+)   Anesthesia Other Findings   Reproductive/Obstetrics negative OB ROS                         Anesthesia Physical Anesthesia Plan  ASA: II  Anesthesia Plan: General   Post-op Pain Management:    Induction: Intravenous  Airway Management Planned: Oral ETT  Additional Equipment: Arterial line  Intra-op Plan:   Post-operative Plan: Extubation in OR  Informed Consent: I have reviewed the patients History and Physical, chart, labs and discussed the procedure including the risks, benefits and alternatives for the proposed anesthesia with the patient or authorized representative who has indicated his/her understanding and acceptance.   Dental advisory given  Plan Discussed with: CRNA, Anesthesiologist and Surgeon  Anesthesia Plan Comments:          Anesthesia Quick Evaluation

## 2012-02-13 NOTE — Preoperative (Signed)
Beta Blockers   Reason not to administer Beta Blockers:Not Applicable 

## 2012-02-13 NOTE — Transfer of Care (Signed)
Immediate Anesthesia Transfer of Care Note  Patient: Cindy Arias  Procedure(s) Performed: Procedure(s) (LRB): SUBOCCIPITAL CRANIECTOMY CERVICAL LAMINECTOMY/DURAPLASTY (N/A)  Patient Location: PACU  Anesthesia Type: General  Level of Consciousness: awake, alert  and oriented  Airway & Oxygen Therapy: Patient Spontanous Breathing and Patient connected to face mask oxygen  Post-op Assessment: Report given to PACU RN, Post -op Vital signs reviewed and stable and Patient moving all extremities X 4  Post vital signs: Reviewed and stable  Complications: No apparent anesthesia complications

## 2012-02-13 NOTE — Plan of Care (Signed)
Problem: Consults Goal: Diagnosis - Craniotomy Outcome: Completed/Met Date Met:  02/13/12 Cindy Arias

## 2012-02-13 NOTE — H&P (Signed)
Subjective: The patient is a 66 year old white female who began having dysphagia approximately Christmas of 2012. She failed medical management and was worked up with a brain MRI which demonstrated she had a Chiari malformation. I discussed the various treatment options with the patient and her husband including surgery. The patient has weighed the risks, benefits and alternatives surgery, like proceed with a Chiari decompression and duraplasty.  Past Medical History  Diagnosis Date  . Pneumonia   . Asthma   . GERD (gastroesophageal reflux disease)   . Colitis   . Osteopenia   . Esophageal stricture     Past Surgical History  Procedure Date  . Tubal ligation 1980's  . Esophageal dilation     Allergies  Allergen Reactions  . Sulfa Antibiotics Hives  . Albuterol Other (See Comments)    States she cannot tolerate albuterol without side effects  . Penicillins Other (See Comments)    Had reaction as a child; doesn't remember what the reaction was.  jkl    History  Substance Use Topics  . Smoking status: Former Research scientist (life sciences)  . Smokeless tobacco: Not on file  . Alcohol Use:      1 glass of wine socially once or twice a month    Family History  Problem Relation Age of Onset  . Anesthesia problems Neg Hx    Prior to Admission medications   Medication Sig Start Date End Date Taking? Authorizing Provider  CALCIUM CITRATE-VITAMIN D PO Take 500 mg by mouth daily.   Yes Historical Provider, MD  cholecalciferol (VITAMIN D) 1000 UNITS tablet Take 1,000 Units by mouth daily.   Yes Historical Provider, MD  levalbuterol [facility] HFA) 45 MCG/ACT inhaler Inhale 1-2 puffs into the lungs every 4 (four) hours as needed. For shortness of breath   Yes Historical Provider, MD  mesalamine (LIALDA) 1.2 G EC tablet Take 1,200 mg by mouth daily with breakfast.   Yes Historical Provider, MD  mometasone-formoterol (DULERA) 100-5 MCG/ACT AERO Inhale 2 puffs into the lungs 2 (two) times daily.   Yes Historical  Provider, MD  montelukast (SINGULAIR) 10 MG tablet Take 10 mg by mouth at bedtime.   Yes Historical Provider, MD  Multiple Vitamins-Iron (MULTIVITAMINS WITH IRON) TABS Take 1 tablet by mouth daily.   Yes Historical Provider, MD  omeprazole (PRILOSEC) 40 MG capsule Take 40 mg by mouth 2 (two) times daily.   Yes Historical Provider, MD  ranitidine (ZANTAC) 300 MG tablet Take 300 mg by mouth at bedtime.   Yes Historical Provider, MD  vitamin E 400 UNIT capsule Take 400 Units by mouth daily.   Yes Historical Provider, MD  OVER THE COUNTER MEDICATION Take 1 tablet by mouth daily. Patriciaann Clan - for hotflashes    Historical Provider, MD  sodium chloride (OCEAN) 0.65 % nasal spray Place 1 spray into the nose at bedtime as needed. For congestion    Historical Provider, MD     Review of Systems  Positive ROS: As above  All other systems have been reviewed and were otherwise negative with the exception of those mentioned in the HPI and as above.  Objective: Vital signs in last 24 hours: Temp:  [97.9 F (36.6 C)] 97.9 F (36.6 C) (04/17 0846) Pulse Rate:  [95] 95  (04/17 0846) Resp:  [18] 18  (04/17 0846) BP: (120)/(85) 120/85 mmHg (04/17 0846) SpO2:  [97 %] 97 % (04/17 0846)  General Appearance: Alert, cooperative, no distress, appears stated age Head: Normocephalic, without obvious abnormality, atraumatic Eyes:  PERRL, conjunctiva/corneas clear, EOM's intact, fundi benign, both eyes      Ears: Normal TM's and external ear canals, both ears Throat: Lips, mucosa, and tongue normal; teeth and gums normal Neck: Supple, symmetrical, trachea midline, no adenopathy; thyroid: No enlargement/tenderness/nodules; no carotid bruit or JVD Back: Symmetric, no curvature, ROM normal, no CVA tenderness Lungs: Clear to auscultation bilaterally, respirations unlabored Heart: Regular rate and rhythm, S1 and S2 normal, no murmur, rub or gallop Abdomen: Soft, non-tender, bowel sounds active all four quadrants, no  masses, no organomegaly Extremities: Extremities normal, atraumatic, no cyanosis or edema Pulses: 2+ and symmetric all extremities Skin: Skin color, texture, turgor normal, no rashes or lesions  NEUROLOGIC:   Mental status: alert and oriented, no aphasia, good attention span, Fund of knowledge/ memory ok Motor Exam - grossly normal Sensory Exam - grossly normal Reflexes: 2+ over 4 in her bilateral quadriceps and gastrocnemius. Coordination - grossly normal Gait - grossly normal Balance - grossly normal Cranial Nerves: I: smell Not tested  II: visual acuity  OS: Normal    OD: Normal   II: visual fields Full to confrontation  II: pupils Equal, round, reactive to light  III,VII: ptosis None  III,IV,VI: extraocular muscles  Full ROM  V: mastication Normal  V: facial light touch sensation  Normal  V,VII: corneal reflex  Present  VII: facial muscle function - upper  Normal  VII: facial muscle function - lower Normal  VIII: hearing Not tested  IX: soft palate elevation  Normal  IX,X: gag reflex Present  XI: trapezius strength  5/5  XI: sternocleidomastoid strength 5/5  XI: neck flexion strength  5/5  XII: tongue strength  Normal    Data Review Lab Results  Component Value Date   WBC 7.2 02/06/2012   HGB 13.4 02/06/2012   HCT 40.7 02/06/2012   MCV 86.0 02/06/2012   PLT 256 02/06/2012   Lab Results  Component Value Date   NA 137 02/06/2012   K 4.1 02/06/2012   CL 102 02/06/2012   CO2 23 02/06/2012   BUN 19 02/06/2012   CREATININE 0.66 02/06/2012   GLUCOSE 91 02/06/2012   No results found for this basename: INR, PROTIME    Assessment/Plan: Chiari 1 malformation, dysphagia: I discussed the situation with the patient and her husband. I reviewed the MR scan with them and pointed out the abnormalities. We have discussed the various treatment options including surgery. I have described the surgical option of a suboccipital craniectomy with a cervical laminectomy and duraplasty. I've  shown her surgical models. We have discussed the risks, benefits, alternatives and likelihood of achieving our goals with surgery. I have answered all the patient's questions. She has decided proceed with surgery.   Shanay Woolman D 02/13/2012 10:27 AM

## 2012-02-14 MED ORDER — MESALAMINE 1.2 G PO TBEC: 2.4000 g | DELAYED_RELEASE_TABLET | Freq: Every day | ORAL | Status: DC

## 2012-02-14 MED ORDER — MESALAMINE 1.2 G PO TBEC
2.4000 g | DELAYED_RELEASE_TABLET | Freq: Every day | ORAL | Status: DC
  Filled 2012-02-14: qty 2

## 2012-02-14 MED ORDER — MORPHINE SULFATE 4 MG/ML IJ SOLN: 4.0000 mg | INTRAMUSCULAR | Status: DC | PRN

## 2012-02-14 MED ORDER — OMEPRAZOLE 20 MG PO CPDR: 40.0000 mg | DELAYED_RELEASE_CAPSULE | Freq: Two times a day (BID) | ORAL | Status: DC

## 2012-02-14 MED ORDER — MESALAMINE 1.2 G PO TBEC
2.4000 g | DELAYED_RELEASE_TABLET | Freq: Every day | ORAL | Status: DC
  Administered 2012-02-15 – 2012-02-16 (×2): 1.2 g via ORAL

## 2012-02-14 MED ORDER — OMEPRAZOLE 20 MG PO CPDR
40.0000 mg | DELAYED_RELEASE_CAPSULE | Freq: Two times a day (BID) | ORAL | Status: DC
  Administered 2012-02-15 – 2012-02-16 (×3): 40 mg via ORAL
  Filled 2012-02-14 (×6): qty 2

## 2012-02-14 MED ORDER — NON FORMULARY: 40.0000 mg | Freq: Two times a day (BID) | Status: DC

## 2012-02-14 NOTE — Evaluation (Signed)
Physical Therapy Evaluation Patient Details Name: Cindy Arias MRN: [id number] DOB: 1946-10-26 Today's Date: 02/14/2012  Problem List: There is no problem list on file for this patient.   Past Medical History:  Past Medical History  Diagnosis Date  . Pneumonia   . Asthma   . GERD (gastroesophageal reflux disease)   . Colitis   . Osteopenia   . Esophageal stricture    Past Surgical History:  Past Surgical History  Procedure Date  . Tubal ligation 1980's  . Esophageal dilation     PT Assessment/Plan/Recommendation PT Assessment Clinical Impression Statement: Pt presents s/p craniectomy with decreased balance during functional activity. Pt will benefit from skilled PT in the acute care setting in order to maximize functional strength and balance for a safe d/c home PT Recommendation/Assessment: Patient will need skilled PT in the acute care venue PT Problem List: Decreased strength;Decreased activity tolerance;Decreased balance;Decreased mobility;Decreased safety awareness PT Therapy Diagnosis : Difficulty walking PT Plan PT Frequency: Min 4X/week PT Treatment/Interventions: DME instruction;Gait training;Stair training;Therapeutic activities;Functional mobility training;Balance training;Neuromuscular re-education;Patient/family education PT Recommendation Follow Up Recommendations: Home health PT;Supervision/Assistance - 24 hour Equipment Recommended: Other (comment) (TBD) PT Goals  Acute Rehab PT Goals PT Goal Formulation: With patient Time For Goal Achievement: 2 weeks Pt will go Sit to Stand: with modified independence PT Goal: Sit to Stand - Progress: Goal set today Pt will go Stand to Sit: with modified independence PT Goal: Stand to Sit - Progress: Goal set today Pt will Transfer Bed to Chair/Chair to Bed: with modified independence PT Transfer Goal: Bed to Chair/Chair to Bed - Progress: Goal set today Pt will Ambulate: >150 feet;with modified independence;with  least restrictive assistive device PT Goal: Ambulate - Progress: Goal set today Pt will Go Up / Down Stairs: Flight;with rail(s);with supervision;1-2 stairs;with min assist (1 stair with min assist; flight with rails with supervision) PT Goal: Up/Down Stairs - Progress: Goal set today  PT Evaluation Precautions/Restrictions  Restrictions Weight Bearing Restrictions: No Prior Functioning  Home Living Lives With: Spouse Available Help at Discharge: Family Type of Home: House Home Access: Stairs to enter [employer] of Steps: 1 (curb) Entrance Stairs-Rails: None Home Layout: Two level;Full bath on main level;Able to live on main level with bedroom/bathroom Alternate Level Stairs-Number of Steps: 15 (chair rail if need be) Alternate Level Stairs-Rails: Right Bathroom Shower/Tub: Tub/shower unit;Curtain Bathroom Toilet: Standard Bathroom Accessibility: Yes How Accessible: Accessible via walker Home Adaptive Equipment: Walker - rolling Prior Function Level of Independence: Independent Able to Take Stairs?: Yes Driving: Yes Vocation: Retired Public librarian Overall Cognitive Status: Appears within functional limits for tasks assessed/performed Arousal/Alertness: Awake/alert Orientation Level: Oriented X4 / Intact Behavior During Session: WFL for tasks performed Sensation/Coordination Sensation Light Touch: Appears Intact Extremity Assessment RLE Assessment RLE Assessment: Exceptions to [facility] RLE Strength RLE Overall Strength: Deficits RLE Overall Strength Comments: Overall 4/5. Unable to tolerate increased resistance with any LE movements LLE Assessment LLE Assessment: Exceptions to [facility] LLE Strength LLE Overall Strength: Deficits LLE Overall Strength Comments: Overall 4/5. Unable to tolerate increased resistance with any LE movements Mobility (including Balance) Bed Mobility Bed Mobility: Yes Supine to Sit: 6: Modified independent (Device/Increase  time) Sitting - Scoot to Edge of Bed: 6: Modified independent (Device/Increase time) Transfers Transfers: Yes Sit to Stand: 4: Min assist;With upper extremity assist;From bed Sit to Stand Details (indicate cue type and reason): Min assist for stability upon standing. VC for anterior translation Stand to Sit: 4: Min assist Stand to Sit Details: Min  assist to control descent onto bed Ambulation/Gait Ambulation/Gait: Yes Ambulation/Gait Assistance: 4: Min assist Ambulation/Gait Assistance Details (indicate cue type and reason): Min assist for stability due to decreased balance with ambulation. Ambulation Distance (Feet): 150 Feet Assistive device: 1 person hand held assist Gait Pattern: Step-to pattern;Decreased stride length;Decreased hip/knee flexion - right;Decreased hip/knee flexion - left Gait velocity: decreased gait speed Stairs: No  Balance Balance Assessed: Yes Static Standing Balance Static Standing - Balance Support: Bilateral upper extremity supported;During functional activity (brushing teeth) Static Standing - Level of Assistance: 4: Min assist Static Standing - Comment/# of Minutes: Minguard to min assist standing at sink Exercise    End of Session PT - End of Session Equipment Utilized During Treatment: Gait belt Activity Tolerance: Patient tolerated treatment well Patient left: in chair;with call bell in reach;with family/visitor present Nurse Communication: Mobility status for transfers;Mobility status for ambulation General Behavior During Session: [facility] for tasks performed Cognition: [facility] for tasks performed  Ambrose Finland 02/14/2012, 2:31 PM  02/14/2012 Ambrose Finland DPT PAGER: [phone] OFFICE: [phone]

## 2012-02-14 NOTE — Progress Notes (Signed)
Subjective: Patient resting in bed fairly comfortably. Limited by mouth intake so far. Continues on IVF.  Objective: Vital signs in last 24 hours: Filed Vitals:   02/14/12 0300 02/14/12 0400 02/14/12 0500 02/14/12 0600  BP: 104/62 112/60 103/54 116/63  Pulse: 70 67 70 80  Temp:  98.5 F (36.9 C)    TempSrc:  Oral    Resp: 9 11 9 17   Height:      Weight:      SpO2: 98% 99% 98% 99%    Intake/Output from previous day: 04/17 0701 - 04/18 0700 In: 4595 [P.O.:120; I.V.:4475] Out: 1450 [Urine:1400; Blood:50] Intake/Output this shift:    Physical Exam:  Dressing is clean and dry. Patient is awake and alert, oriented. Following commands. Speech is fluent. Pupils are equal round and reactive to light. Extra ocular movements intact. Facial movements symmetrical. Hearing is present. Tongue midline. Moving all 4 extremity is well. No drift of upper extremities.   Assessment/Plan: We'll DC Foley, A-line, and PCA morphine. Have encouraged patient and staff to progress to out of bed to chair and ambulation.   Hosie Spangle, MD 02/14/2012, 7:43 AM

## 2012-02-14 NOTE — Progress Notes (Signed)
Occupational Therapy Evaluation Patient Details Name: Cindy Arias MRN: [id number] DOB: 09/25/1946 Today's Date: 02/14/2012  Problem List: There is no problem list on file for this patient.   Past Medical History:  Past Medical History  Diagnosis Date  . Pneumonia   . Asthma   . GERD (gastroesophageal reflux disease)   . Colitis   . Osteopenia   . Esophageal stricture    Past Surgical History:  Past Surgical History  Procedure Date  . Tubal ligation 1980's  . Esophageal dilation     OT Assessment/Plan/Recommendation OT Assessment Clinical Impression Statement: Pt s/p craniectomy with decreased balance during functional activity. Will benefit from acute OT to address below problem list in prep for d/c home. OT Recommendation/Assessment: Patient will need skilled OT in the acute care venue OT Problem List: Decreased activity tolerance;Impaired balance (sitting and/or standing);Pain;Decreased knowledge of precautions;Decreased knowledge of use of DME or AE OT Therapy Diagnosis : Acute pain;Generalized weakness OT Plan OT Frequency: Min 2X/week OT Treatment/Interventions: Self-care/ADL training;DME and/or AE instruction;Therapeutic activities;Patient/family education;Balance training OT Recommendation Follow Up Recommendations: Supervision/Assistance - 24 hour Equipment Recommended: Tub/shower seat Individuals Consulted Consulted and Agree with Results and Recommendations: Patient OT Goals Acute Rehab OT Goals OT Goal Formulation: With patient Time For Goal Achievement: 7 days ADL Goals Pt Will Perform Grooming: with set-up;Standing at sink ADL Goal: Grooming - Progress: Goal set today Pt Will Perform Lower Body Bathing: with set-up;Sit to stand from chair;Sit to stand from bed ADL Goal: Lower Body Bathing - Progress: Goal set today Pt Will Perform Lower Body Dressing: with set-up;Sit to stand from chair;Sit to stand from bed ADL Goal: Lower Body Dressing - Progress:  Goal set today Pt Will Transfer to Toilet: with supervision;Ambulation;Regular height toilet ADL Goal: Toilet Transfer - Progress: Goal set today Pt Will Perform Toileting - Clothing Manipulation: with supervision;Sitting on 3-in-1 or toilet;Standing ADL Goal: Toileting - Clothing Manipulation - Progress: Goal set today Pt Will Perform Tub/Shower Transfer: Tub transfer;Ambulation;Shower seat with back;Other (comment) (min guard assist) ADL Goal: Tub/Shower Transfer - Progress: Goal set today  OT Evaluation Precautions/Restrictions  Restrictions Weight Bearing Restrictions: No Prior Functioning Home Living Lives With: Spouse Available Help at Discharge: Family Type of Home: House Home Access: Stairs to enter [employer] of Steps: 1 (curb) Entrance Stairs-Rails: None Home Layout: Two level;Full bath on main level;Able to live on main level with bedroom/bathroom Alternate Level Stairs-Number of Steps: 15 (chair rail if need be) Alternate Level Stairs-Rails: Right Bathroom Shower/Tub: Tub/shower unit;Curtain Bathroom Toilet: Standard Bathroom Accessibility: Yes How Accessible: Accessible via walker Home Adaptive Equipment: Walker - rolling Prior Function Level of Independence: Independent Able to Take Stairs?: Yes Driving: Yes Vocation: Retired  ADL ADL Grooming: Performed;Wash/dry face;Teeth care;Minimal assistance Grooming Details (indicate cue type and reason): Min assist for steadying and balance when reaching for items. Where Assessed - Grooming: Standing at sink Upper Body Bathing: Simulated;Set up Upper Body Bathing Details (indicate cue type and reason): Setup to gather bathing items Where Assessed - Upper Body Bathing: Sitting, bed Lower Body Bathing: Simulated;Minimal assistance Lower Body Bathing Details (indicate cue type and reason): min assist for steadying and balance when standing Where Assessed - Lower Body Bathing: Sit to stand from bed Upper Body  Dressing: Simulated;Set up Upper Body Dressing Details (indicate cue type and reason): setup to gather dressing items Where Assessed - Upper Body Dressing: Sitting, bed Lower Body Dressing: Minimal assistance;Performed Lower Body Dressing Details (indicate cue type and reason): Pt donned bil. socks  when crossing ankles over knees. Min assist for steadying and balance when standing. Where Assessed - Lower Body Dressing: Sit to stand from bed Toilet Transfer: Simulated;Minimal assistance Toilet Transfer Details (indicate cue type and reason): Min assist for balance Toilet Transfer Method: Ambulating Toilet Transfer Equipment: Other (comment) (recliner) Ambulation Related to ADLs: Pt requires min assist for ambulation for balance ADL Comments: Increased time to perform tasks due to fatigue and balance deficits. Vision/Perception  Vision - History Baseline Vision: Wears glasses all the time Patient Visual Report: No change from baseline Cognition Cognition Overall Cognitive Status: Appears within functional limits for tasks assessed/performed Arousal/Alertness: Awake/alert Orientation Level: Oriented X4 / Intact Behavior During Session: WFL for tasks performed Sensation/Coordination Sensation Light Touch: Appears Intact Coordination Gross Motor Movements are Fluid and Coordinated: Yes Fine Motor Movements are Fluid and Coordinated: Yes Extremity Assessment RUE Assessment RUE Assessment: Within Functional Limits LUE Assessment LUE Assessment: Within Functional Limits Mobility  Bed Mobility Bed Mobility: Yes Supine to Sit: 6: Modified independent (Device/Increase time) Sitting - Scoot to Edge of Bed: 6: Modified independent (Device/Increase time) Transfers Sit to Stand: 4: Min assist;With upper extremity assist;From bed Sit to Stand Details (indicate cue type and reason): Min assist for stability upon standing. VC for anterior translation Stand to Sit: 4: Min assist Stand to Sit  Details: Min assist to control descent onto bed Exercises   End of Session OT - End of Session Equipment Utilized During Treatment: Gait belt Activity Tolerance: Patient limited by fatigue Patient left: in chair;with call bell in reach;with family/visitor present Nurse Communication: Mobility status for transfers;Mobility status for ambulation General Behavior During Session: [facility] for tasks performed Cognition: [facility] for tasks performed  2:54 PM  02/14/2012 Darrol Jump OTR/L Pager [phone] Office [phone]

## 2012-02-14 NOTE — Progress Notes (Signed)
UR COMPLETED  

## 2012-02-15 ENCOUNTER — Encounter (HOSPITAL_COMMUNITY): Payer: Self-pay | Admitting: Neurosurgery

## 2012-02-15 NOTE — Progress Notes (Signed)
Physical Therapy Treatment Patient Details Name: Cindy Arias MRN: [id number] DOB: June 04, 1946 Today's Date: 02/15/2012 Time: 6384-5364 PT Time Calculation (min): 19 min  PT Assessment / Plan / Recommendation Comments on Treatment Session  Pt progressing well although still slightly off balance during quick movements or turns. Educated pt and family on importance of safety upon d/c. Recommend OPPT initially for safety and high level balance activities.     Follow Up Recommendations  Outpatient PT;Supervision/Assistance - 24 hour    Equipment Recommendations  Tub/shower seat;Other (comment) (RW TBD)    Frequency Min 4X/week   Plan Discharge plan remains appropriate;Frequency remains appropriate    Precautions / Restrictions Restrictions Weight Bearing Restrictions: No       Mobility  Bed Mobility Supine to Sit: 6: Modified independent (Device/Increase time) Sitting - Scoot to Edge of Bed: 6: Modified independent (Device/Increase time) Transfers Transfers: Sit to Stand;Stand to Sit Sit to Stand: 6: Modified independent (Device/Increase time) Stand to Sit: 6: Modified independent (Device/Increase time) Ambulation/Gait Ambulation/Gait Assistance: 4: Min assist Ambulation Distance (Feet): 300 Feet Assistive device: None Ambulation/Gait Assistance Details: Min assist for stability without hand held assist this session. Pt with slight loss of balance on turns or sudden movements Gait Pattern: Within Functional Limits Gait velocity: decreased gait speed Stairs: Yes Stairs Assistance: 5: Supervision Stairs Assistance Details (indicate cue type and reason): VC for sequencing. Min assist for 1 step without rails. Supervision for rails Stair Management Technique: One rail Right;Step to pattern;Forwards Number of Stairs: 10     Exercises     PT Goals Acute Rehab PT Goals PT Goal Formulation: With patient Time For Goal Achievement: 02/28/12 Potential to Achieve Goals:  Good PT Goal: Sit to Stand - Progress: Met PT Goal: Stand to Sit - Progress: Met PT Transfer Goal: Bed to Chair/Chair to Bed - Progress: Progressing toward goal PT Goal: Ambulate - Progress: Progressing toward goal PT Goal: Up/Down Stairs - Progress: Met  Visit Information  Last PT Received On: 02/15/12 Assistance Needed: +1    Subjective Data      Cognition  Overall Cognitive Status: Appears within functional limits for tasks assessed/performed Arousal/Alertness: Awake/alert Orientation Level: Oriented X4 / Intact Behavior During Session: [facility] for tasks performed    Balance  Dynamic Standing Balance Dynamic Standing - Balance Support: No upper extremity supported;During functional activity Dynamic Standing - Level of Assistance: 4: Min assist Dynamic Standing - Balance Activities: Other (comment) (quick stop and turns) Dynamic Standing - Comments: Min assist for stability with sudden movements  End of Session PT - End of Session Equipment Utilized During Treatment: Gait belt Activity Tolerance: Patient tolerated treatment well Patient left: in chair;with call bell/phone within reach;with family/visitor present Nurse Communication: Mobility status    Ambrose Finland 02/15/2012, 5:26 PM  02/15/2012 Ambrose Finland DPT PAGER: [phone] OFFICE: [phone]

## 2012-02-15 NOTE — Progress Notes (Signed)
Doing well. C/o appropriate incisional soreness/ HA.  No Numbness, tingling, weakness, mobalizing No Nausea /vomiting, dysphagia stable   Temp:  [98 F (36.7 C)-98.4 F (36.9 C)] 98.3 F (36.8 C) (04/19 0400) Pulse Rate:  [65-99] 65  (04/19 0800) Resp:  [5-19] 14  (04/19 0800) BP: (80-133)/(45-82) 130/64 mmHg (04/19 0400) SpO2:  [92 %-99 %] 97 % (04/19 0800) Good strength and sensation Incision CDI CN's intact  Plan: Increase activity - SpeechTx  re-eval Transfer to floor

## 2012-02-15 NOTE — Progress Notes (Signed)
02/15/2012 Jacqualyn Posey PTA [phone] pager [phone] office

## 2012-02-15 NOTE — Progress Notes (Signed)
PT Cancellation Note  Treatment cancelled today due to patient's refusal to participate. Pt politely refused treatment so she could finish her bath/clean-up. Will attempt later if time allows.    Cathlean Cower 02/15/2012, 1:56 PM

## 2012-02-16 MED ORDER — PANTOPRAZOLE SODIUM 40 MG PO TBEC: 80.0000 mg | DELAYED_RELEASE_TABLET | Freq: Every day | ORAL | Status: DC

## 2012-02-16 MED ORDER — MESALAMINE 1.2 G PO TBEC
2400.0000 mg | DELAYED_RELEASE_TABLET | Freq: Every day | ORAL | Status: DC
  Filled 2012-02-16: qty 2

## 2012-02-16 NOTE — Discharge Summary (Signed)
Physician Discharge Summary  Patient ID: TASFIA VASSEUR MRN: [id number] DOB/AGE: [id number] 64 y.o.  Admit date: 02/13/2012 Discharge date: 02/16/2012  Admission Diagnoses:chiari malformation  Discharge Diagnoses: same   Discharged Condition: incisional pain. Continue with chronic dysphagia  Hospital Course: surgery  Consults: none  Significant Diagnostic Studies: mri  Treatments: surgery  Discharge Exam: Blood pressure 146/65, pulse 65, temperature 97.9 F (36.6 C), temperature source Oral, resp. rate 18, height 4' 11"  (1.499 m), weight 55.2 kg (121 lb 11.1 oz), last menstrual period 02/13/2012, SpO2 95.00%. No weakness. ambulating  Disposition: home   Medication List  As of 02/16/2012  9:01 AM   ASK your doctor about these medications         CALCIUM CITRATE-VITAMIN D PO   Take 500 mg by mouth daily.      cholecalciferol 1000 UNITS tablet   Commonly known as: VITAMIN D   Take 1,000 Units by mouth daily.      levalbuterol 45 MCG/ACT inhaler   Commonly known as: XOPENEX HFA   Inhale 1-2 puffs into the lungs every 4 (four) hours as needed. For shortness of breath      mesalamine 1.2 G EC tablet   Commonly known as: LIALDA   Take 2,400 mg by mouth at bedtime.      mometasone-formoterol 100-5 MCG/ACT Aero   Commonly known as: DULERA   Inhale 2 puffs into the lungs 2 (two) times daily.      montelukast 10 MG tablet   Commonly known as: SINGULAIR   Take 10 mg by mouth at bedtime.      multivitamins with iron Tabs   Take 1 tablet by mouth daily.      omeprazole 40 MG capsule   Commonly known as: PRILOSEC   Take 40 mg by mouth daily. 30 minutes before morning meal      OVER THE COUNTER MEDICATION   Take 1 tablet by mouth daily. Estrovera - for hotflashes      ranitidine 300 MG tablet   Commonly known as: ZANTAC   Take 300 mg by mouth at bedtime.      sodium chloride 0.65 % nasal spray   Commonly known as: OCEAN   Place 1 spray into the nose at bedtime  as needed. For congestion      vitamin E 400 UNIT capsule   Take 400 Units by mouth daily.             Signed: Floyce Stakes 02/16/2012, 9:01 AM

## 2013-02-05 HISTORY — PX: OTHER SURGICAL HISTORY: SHX169

## 2013-12-23 ENCOUNTER — Encounter: Payer: Self-pay | Admitting: Obstetrics and Gynecology

## 2013-12-28 ENCOUNTER — Ambulatory Visit: Payer: Self-pay | Admitting: Obstetrics and Gynecology

## 2013-12-28 ENCOUNTER — Ambulatory Visit (INDEPENDENT_AMBULATORY_CARE_PROVIDER_SITE_OTHER): Payer: Medicare Other | Admitting: Gynecology

## 2013-12-28 ENCOUNTER — Encounter: Payer: Self-pay | Admitting: Gynecology

## 2013-12-28 VITALS — BP 114/67 | HR 71 | Resp 18 | Ht 59.0 in | Wt 119.0 lb

## 2013-12-28 DIAGNOSIS — Z01419 Encounter for gynecological examination (general) (routine) without abnormal findings: Secondary | ICD-10-CM

## 2013-12-28 NOTE — Patient Instructions (Signed)

## 2013-12-28 NOTE — Progress Notes (Signed)
68 y.o. Married Caucasian female   G2P1011 here for annual exam. Pt reports menses are absent due to Menopause regular.  She occassionally report hot flashes, does not have night sweats, periodically have vaginal dryness.  She is using lubricants.  She does not report post-menopasual bleeding.  Pt has repair of arnold chiari malformation in 2013, issues with swallowing.  Pt is doing well since.    Patient's last menstrual period was 02/13/2012.          Sexually active: yes  The current method of family planning is post menopausal status.    Exercising: no  The patient does not participate in regular exercise at present. Last pap: 2011 Negative  Abnormal PAP: no  Mammogram: 08/03/2012 Bi-Rads 1  BSE: yes  Colonoscopy: Sept  2012 DEXA: 10/08/13 Normal   Alcohol: 2 glasses of wine/wk socially  Tobacco: no  Health Maintenance  Topic Date Due  . Tetanus/tdap  12/19/1966  . Mammogram  12/19/1997  . Colonoscopy  12/19/1997  . Zostavax  12/20/2007  . Pneumococcal Polysaccharide Vaccine Age 72 And Over  12/19/2012  . Influenza Vaccine  05/29/2013    Family History  Problem Relation Age of Onset  . Anesthesia problems Neg Hx   . Diabetes Maternal Grandfather     Type 2   . Cancer Mother     Liver- 2003 Lymphoma  . Hypertension Maternal Grandmother   . Heart disease Maternal Grandmother   . Heart disease Maternal Grandfather   . Osteoporosis Maternal Grandmother   . Rheum arthritis Mother     There are no active problems to display for this patient.   Past Medical History  Diagnosis Date  . Pneumonia   . Asthma   . GERD (gastroesophageal reflux disease)   . Colitis   . Osteopenia   . Esophageal stricture   . History of infertility     Past Surgical History  Procedure Laterality Date  . Tubal ligation  1987  . Esophageal dilation    . Suboccipital craniectomy cervical laminectomy  02/13/2012    Procedure: SUBOCCIPITAL CRANIECTOMY CERVICAL LAMINECTOMY/DURAPLASTY;   Surgeon: Ophelia Charter, MD;  Location: [facility];  Service: Neurosurgery;  Laterality: N/A;  Suboccipital craniectomy with upper cervical laminectomy and duraplasty  . Repair of chiari malformation  02/05/13    Allergies: Sulfa antibiotics; Albuterol; and Penicillins  Current Outpatient Prescriptions  Medication Sig Dispense Refill  . aspirin 81 MG tablet Take 81 mg by mouth daily.      Marland Kitchen CALCIUM CITRATE-VITAMIN D PO Take 500 mg by mouth daily.      . Calcium-Magnesium-Vitamin D (CALCIUM 500 PO) Take by mouth.      . cetirizine (ZYRTEC) 10 MG tablet Take 10 mg by mouth as needed for allergies.      . cholecalciferol (VITAMIN D) 1000 UNITS tablet Take 1,000 Units by mouth daily.      . Fluticasone-Salmeterol (ADVAIR DISKUS) 250-50 MCG/DOSE AEPB Inhale 1 puff into the lungs 2 (two) times daily.      . hydrocortisone (ANUSOL-HC) 25 MG suppository Place 25 mg rectally 2 (two) times daily.      . hydrocortisone cream 1 % Apply 1 application topically 2 (two) times daily.      Marland Kitchen levalbuterol (XOPENEX HFA) 45 MCG/ACT inhaler Inhale 1-2 puffs into the lungs every 4 (four) hours as needed. For shortness of breath      . mesalamine (LIALDA) 1.2 G EC tablet Take 2,400 mg by mouth at bedtime.       Marland Kitchen  mometasone-formoterol (DULERA) 100-5 MCG/ACT AERO Inhale 2 puffs into the lungs 2 (two) times daily.      . montelukast (SINGULAIR) 10 MG tablet Take 10 mg by mouth at bedtime.      . Multiple Vitamins-Iron (MULTIVITAMINS WITH IRON) TABS Take 1 tablet by mouth daily.      Marland Kitchen omeprazole (PRILOSEC) 40 MG capsule Take 40 mg by mouth daily. 30 minutes before morning meal      . OVER THE COUNTER MEDICATION Take 1 tablet by mouth daily. Estrovera - for hotflashes      . sodium chloride (OCEAN) 0.65 % nasal spray Place 1 spray into the nose at bedtime as needed. For congestion      . triamcinolone cream (KENALOG) 0.1 % Apply 1 application topically as needed.      . vitamin E 400 UNIT capsule Take 400 Units  by mouth daily.       No current facility-administered medications for this visit.    ROS: Pertinent items are noted in HPI.  Exam:    BP 114/67  Pulse 71  Resp 18  Ht 4' 11"  (1.499 m)  Wt 119 lb (53.978 kg)  BMI 24.02 kg/m2  LMP 02/13/2012 Weight change: @WEIGHTCHANGE @ Last 3 height recordings:  Ht Readings from Last 3 Encounters:  12/28/13 4' 11"  (1.499 m)  02/13/12 4' 11"  (1.499 m)  02/13/12 4' 11"  (1.499 m)   General appearance: alert, cooperative and appears stated age Head: Normocephalic, without obvious abnormality, atraumatic Neck: no adenopathy, no carotid bruit, no JVD, supple, symmetrical, trachea midline and thyroid not enlarged, symmetric, no tenderness/mass/nodules Lungs: clear to auscultation bilaterally Breasts: normal appearance, no masses or tenderness Heart: regular rate and rhythm, S1, S2 normal, no murmur, click, rub or gallop Abdomen: soft, non-tender; bowel sounds normal; no masses,  no organomegaly Extremities: extremities normal, atraumatic, no cyanosis or edema Skin: Skin color, texture, turgor normal. No rashes or lesions Lymph nodes: Cervical, supraclavicular, and axillary nodes normal. no inguinal nodes palpated Neurologic: Grossly normal   Pelvic: External genitalia:  no lesions              Urethra: normal appearing urethra with no masses, tenderness or lesions              Bartholins and Skenes: normal                 Vagina: normal appearing vagina with normal color and discharge, no lesions              Cervix: normal appearance              Pap taken: no        Bimanual Exam:  Uterus:  uterus is normal size, shape, consistency and nontender                                      Adnexa:    normal adnexa in size, nontender and no masses                                      Rectovaginal: Confirms                                      Anus:  normal  sphincter tone, no lesions  A: well woman     P: mammogram annually pap smear guidelines  reviewed counseled on breast self exam, mammography screening, osteoporosis, adequate intake of calcium and vitamin D, diet and exercise return annually or prn Discussed PAP guideline changes, importance of weight bearing exercises, calcium, vit D and balanced diet.  An After Visit Summary was printed and given to the patient.

## 2014-07-29 HISTORY — PX: BUNIONECTOMY WITH HAMMERTOE RECONSTRUCTION: SHX5600

## 2014-08-30 ENCOUNTER — Encounter: Payer: Self-pay | Admitting: Gynecology

## 2015-01-03 ENCOUNTER — Ambulatory Visit: Payer: Medicare Other | Admitting: Gynecology

## 2015-01-17 ENCOUNTER — Ambulatory Visit (INDEPENDENT_AMBULATORY_CARE_PROVIDER_SITE_OTHER): Payer: Medicare Other | Admitting: Nurse Practitioner

## 2015-01-17 ENCOUNTER — Encounter: Payer: Self-pay | Admitting: Nurse Practitioner

## 2015-01-17 VITALS — BP 130/76 | HR 64 | Ht 58.75 in | Wt 117.0 lb

## 2015-01-17 DIAGNOSIS — N952 Postmenopausal atrophic vaginitis: Secondary | ICD-10-CM

## 2015-01-17 DIAGNOSIS — Z780 Asymptomatic menopausal state: Secondary | ICD-10-CM

## 2015-01-17 DIAGNOSIS — Z01419 Encounter for gynecological examination (general) (routine) without abnormal findings: Secondary | ICD-10-CM

## 2015-01-17 NOTE — Progress Notes (Signed)
Patient ID: Cindy Arias, female   DOB: 1946-06-02, 69 y.o.   MRN: [id number] 69 y.o. G93P1011 Married  Caucasian Fe here for annual exam.  No current health problems.  Patient's last menstrual period was 02/13/2012.          Sexually active: No.  The current method of family planning is post menopausal status.    Exercising: Yes.    walking with season and weather permits Smoker:  no  Health Maintenance: Pap:  2011, negative per patient MMG:  09/27/14, Bi-Rads 1:  Negative Colonoscopy:  06/2011, normal, repeat in 10 years BMD:   10/08/13, normal TDaP:  09/12/12 Prevnar 13:  09/27/14 Shingles vaccine: 01/20/09 Labs:  Dr. Cari Caraway   reports that she has quit smoking. She has never used smokeless tobacco. She reports that she drinks about 0.6 oz of alcohol per week. She reports that she does not use illicit drugs.  Past Medical History  Diagnosis Date  . Pneumonia   . Asthma   . GERD (gastroesophageal reflux disease)   . Colitis   . Osteopenia   . Esophageal stricture   . History of infertility     Past Surgical History  Procedure Laterality Date  . Tubal ligation  1987  . Esophageal dilation    . Suboccipital craniectomy cervical laminectomy  02/13/2012    Procedure: SUBOCCIPITAL CRANIECTOMY CERVICAL LAMINECTOMY/DURAPLASTY;  Surgeon: Ophelia Charter, MD;  Location: [facility];  Service: Neurosurgery;  Laterality: N/A;  Suboccipital craniectomy with upper cervical laminectomy and duraplasty  . Repair of chiari malformation  02/05/13  . Bunionectomy with hammertoe reconstruction Left 07/2014    Current Outpatient Prescriptions  Medication Sig Dispense Refill  . aspirin 81 MG tablet Take 81 mg by mouth daily.    Marland Kitchen CALCIUM CITRATE-VITAMIN D PO Take 500 mg by mouth daily.    . cetirizine (ZYRTEC) 10 MG tablet Take 10 mg by mouth as needed for allergies.    . cholecalciferol (VITAMIN D) 1000 UNITS tablet Take 1,000 Units by mouth daily.    . hydrocortisone (ANUSOL-HC)  25 MG suppository Place 25 mg rectally 2 (two) times daily.    . hydrocortisone cream 1 % Apply 1 application topically 2 (two) times daily.    Marland Kitchen levalbuterol (XOPENEX HFA) 45 MCG/ACT inhaler Inhale 1-2 puffs into the lungs every 4 (four) hours as needed. For shortness of breath    . mesalamine (LIALDA) 1.2 G EC tablet Take 1.2 g by mouth daily with breakfast.     . mometasone-formoterol (DULERA) 100-5 MCG/ACT AERO Inhale 2 puffs into the lungs daily.     . Multiple Vitamins-Iron (MULTIVITAMINS WITH IRON) TABS Take 1 tablet by mouth daily.    Marland Kitchen omeprazole (PRILOSEC) 40 MG capsule Take 40 mg by mouth daily. 30 minutes before morning meal    . OVER THE COUNTER MEDICATION Take 1 tablet by mouth daily. Estrovera - for hotflashes    . sodium chloride (OCEAN) 0.65 % nasal spray Place 1 spray into the nose at bedtime as needed. For congestion    . triamcinolone cream (KENALOG) 0.1 % Apply 1 application topically as needed.    . vitamin E 400 UNIT capsule Take 400 Units by mouth daily.     No current facility-administered medications for this visit.    Family History  Problem Relation Age of Onset  . Anesthesia problems Neg Hx   . Diabetes Maternal Grandfather     Type 2   . Cancer Mother  Liver- 2003 Lymphoma  . Hypertension Maternal Grandmother   . Heart disease Maternal Grandmother   . Heart disease Maternal Grandfather   . Osteoporosis Maternal Grandmother   . Rheum arthritis Mother     ROS:  Pertinent items are noted in HPI.  Otherwise, a comprehensive ROS was negative.  Exam:   BP 130/76 mmHg  Pulse 64  Ht 4' 10.75" (1.492 m)  Wt 117 lb (53.071 kg)  BMI 23.84 kg/m2  LMP 02/13/2012 Height: 4' 10.75" (149.2 cm) Ht Readings from Last 3 Encounters:  01/17/15 4' 10.75" (1.492 m)  12/28/13 4' 11"  (1.499 m)  02/13/12 4' 11"  (1.499 m)    General appearance: alert, cooperative and appears stated age Head: Normocephalic, without obvious abnormality, atraumatic Neck: no  adenopathy, supple, symmetrical, trachea midline and thyroid normal to inspection and palpation Lungs: clear to auscultation bilaterally Breasts: normal appearance, no masses or tenderness Heart: regular rate and rhythm Abdomen: soft, non-tender; no masses,  no organomegaly Extremities: extremities normal, atraumatic, no cyanosis or edema Skin: Skin color, texture, turgor normal. No rashes or lesions Lymph nodes: Cervical, supraclavicular, and axillary nodes normal. No abnormal inguinal nodes palpated Neurologic: Grossly normal   Pelvic: External genitalia:  no lesions, skin tag at left perianal area              Urethra:  normal appearing urethra with no masses, tenderness or lesions              Bartholin's and Skene's: normal                 Vagina: normal appearing vagina with normal color and discharge, no lesions              Cervix: anteverted              Pap taken: No. Bimanual Exam:  Uterus:  normal size, contour, position, consistency, mobility, non-tender              Adnexa: no mass, fullness, tenderness               Rectovaginal: Confirms               Anus:  normal sphincter tone, no lesions  Chaperone present:  no  A:  Well Woman with normal exam  Postmenopausal no HRT  Atrophic vaginitis - no treatment wanted  History of asthma   P:   Reviewed health and wellness pertinent to exam  Pap smear not taken today  Mammogram is due 11/16  Counseled on breast self exam, mammography screening, adequate intake of calcium and vitamin D, diet and exercise, Kegel's exercises return annually or prn  An After Visit Summary was printed and given to the patient.

## 2015-01-17 NOTE — Patient Instructions (Signed)

## 2015-01-20 NOTE — Progress Notes (Signed)
Encounter reviewed by Dr. Brook Silva.  

## 2015-10-30 HISTORY — PX: CATARACT EXTRACTION: SUR2

## 2015-11-30 ENCOUNTER — Telehealth: Payer: Self-pay | Admitting: Allergy and Immunology

## 2015-11-30 NOTE — Telephone Encounter (Signed)
Please advise 

## 2015-11-30 NOTE — Telephone Encounter (Signed)
Discussed with Patient. T= 99. Will use nasal saline, ibuprofen, antihistamine and contact use if worse.

## 2015-11-30 NOTE — Telephone Encounter (Signed)
Pt called in with symptoms and wanted to tell [place] .  She has Fever, yellow septum, aches and chills. i suggested she call her PCP. She said she likes to run things by [employer] first. pls advise [employer]

## 2015-12-19 NOTE — Telephone Encounter (Signed)
Spoke with patient who has tried all of the following suggestions from Dr Neldon Mc. Dr Neldon Mc please advise.

## 2015-12-19 NOTE — Telephone Encounter (Signed)
Please see if patient can come in tomorrow. Can use Mucinex DM.

## 2015-12-19 NOTE — Telephone Encounter (Signed)
Advised patient to use the Mucinex and patient will make apt with Dr Neldon Mc for Wednesday.

## 2015-12-19 NOTE — Telephone Encounter (Signed)
Patient is having a bad dry cough, septum is clear now with no fevers. She is coughing so much its giving her a bad headache. It seems to get worse towards the evening and night time.   Please [place]

## 2015-12-21 ENCOUNTER — Ambulatory Visit: Payer: Medicare Other | Admitting: Allergy and Immunology

## 2016-05-08 ENCOUNTER — Encounter: Payer: Self-pay | Admitting: Allergy and Immunology

## 2016-05-08 ENCOUNTER — Ambulatory Visit (INDEPENDENT_AMBULATORY_CARE_PROVIDER_SITE_OTHER): Payer: Medicare Other | Admitting: Allergy and Immunology

## 2016-05-08 VITALS — BP 128/80 | HR 72 | Resp 16

## 2016-05-08 DIAGNOSIS — H1010 Acute atopic conjunctivitis, unspecified eye: Secondary | ICD-10-CM | POA: Diagnosis not present

## 2016-05-08 DIAGNOSIS — J387 Other diseases of larynx: Secondary | ICD-10-CM

## 2016-05-08 DIAGNOSIS — J309 Allergic rhinitis, unspecified: Secondary | ICD-10-CM

## 2016-05-08 DIAGNOSIS — J4540 Moderate persistent asthma, uncomplicated: Secondary | ICD-10-CM | POA: Diagnosis not present

## 2016-05-08 DIAGNOSIS — K219 Gastro-esophageal reflux disease without esophagitis: Secondary | ICD-10-CM

## 2016-05-08 MED ORDER — LEVALBUTEROL TARTRATE 45 MCG/ACT IN AERO: 1.0000 | INHALATION_SPRAY | RESPIRATORY_TRACT | Status: DC | PRN

## 2016-05-08 MED ORDER — MONTELUKAST SODIUM 10 MG PO TABS: 10.0000 mg | ORAL_TABLET | Freq: Every day | ORAL | Status: DC

## 2016-05-08 MED ORDER — BUDESONIDE-FORMOTEROL FUMARATE 160-4.5 MCG/ACT IN AERO: 2.0000 | INHALATION_SPRAY | Freq: Two times a day (BID) | RESPIRATORY_TRACT | Status: DC

## 2016-05-08 NOTE — Progress Notes (Signed)
Follow-up Note  Referring Provider: Cari Caraway, MD Primary Provider: Cari Caraway, MD Date of Office Visit: 05/08/2016  Subjective:   Cindy Arias (DOB: May 18, 1946) is a 70 y.o. female who returns to the [facility] on 05/08/2016 in re-evaluation of the following:  HPI: Cindy Arias returns to this clinic in reevaluation of her asthma and allergic rhinoconjunctivitis and LPR. I've not seen her in his clinic in 1 year.  During the interval she has had no flareups of her asthma requiring a systemic steroid while consistently using her Dulera 100 2 inhalations one time per day. Rarely does she use A bronchodilator and rarely does she have any problem with exercise.  Her nose has been doing quite well while consistently using montelukast and occasionally some nasal saline. She'll take Zyrtec on occasion. She has not required any antibiotics treat an episode of sinusitis.  Her reflux and her throat is been doing very well on omeprazole 40 mg twice a day.    Medication List           aspirin 81 MG tablet  Take 81 mg by mouth daily.     budesonide-formoterol 160-4.5 MCG/ACT inhaler  Commonly known as:  SYMBICORT  Inhale 2 puffs into the lungs daily.     CALCIUM CITRATE-VITAMIN D PO  Take 500 mg by mouth daily.     cetirizine 10 MG tablet  Commonly known as:  ZYRTEC  Take 10 mg by mouth as needed for allergies.     cholecalciferol 1000 units tablet  Commonly known as:  VITAMIN D  Take 1,000 Units by mouth daily.     hydrocortisone 25 MG suppository  Commonly known as:  ANUSOL-HC  Place 25 mg rectally 2 (two) times daily.     hydrocortisone cream 1 %  Apply 1 application topically 2 (two) times daily.     levalbuterol 45 MCG/ACT inhaler  Commonly known as:  XOPENEX HFA  Inhale 1-2 puffs into the lungs every 4 (four) hours as needed. For shortness of breath     mesalamine 1.2 g EC tablet  Commonly known as:  LIALDA  Take 1.2 g by mouth daily with  breakfast.     montelukast 10 MG tablet  Commonly known as:  SINGULAIR  Take 10 mg by mouth at bedtime.     multivitamins with iron Tabs tablet  Take 1 tablet by mouth daily.     omeprazole 40 MG capsule  Commonly known as:  PRILOSEC  Take 40 mg by mouth daily. 30 minutes before morning meal     OVER THE COUNTER MEDICATION  Take 1 tablet by mouth daily. Estrovera - for hotflashes     sodium chloride 0.65 % nasal spray  Commonly known as:  OCEAN  Place 1 spray into the nose at bedtime as needed. For congestion     triamcinolone cream 0.1 %  Commonly known as:  KENALOG  Apply 1 application topically as needed.     vitamin E 400 UNIT capsule  Take 400 Units by mouth daily.        Past Medical History  Diagnosis Date  . Pneumonia   . Asthma   . GERD (gastroesophageal reflux disease)   . Colitis   . Osteopenia   . Esophageal stricture   . History of infertility     Past Surgical History  Procedure Laterality Date  . Tubal ligation  1987  . Esophageal dilation    . Suboccipital craniectomy cervical laminectomy  02/13/2012    Procedure: SUBOCCIPITAL CRANIECTOMY CERVICAL LAMINECTOMY/DURAPLASTY;  Surgeon: Ophelia Charter, MD;  Location: [facility];  Service: Neurosurgery;  Laterality: N/A;  Suboccipital craniectomy with upper cervical laminectomy and duraplasty  . Repair of chiari malformation  02/05/13  . Bunionectomy with hammertoe reconstruction Left 07/2014    Allergies  Allergen Reactions  . Sulfa Antibiotics Hives  . Albuterol Other (See Comments)    States she cannot tolerate albuterol without side effects  . Penicillins Other (See Comments)    Had reaction as a child; doesn't remember what the reaction was.  jkl    Review of systems negative except as noted in HPI / PMHx or noted below:  Review of Systems  Constitutional: Negative.   HENT: Negative.   Eyes: Negative.   Respiratory: Negative.   Cardiovascular: Negative.   Gastrointestinal: Negative.    Genitourinary: Negative.   Musculoskeletal: Negative.   Skin: Negative.   Neurological: Negative.   Endo/Heme/Allergies: Negative.   Psychiatric/Behavioral: Negative.      Objective:   Filed Vitals:   05/08/16 1118  BP: 128/80  Pulse: 72  Resp: 16          Physical Exam  Constitutional: She is well-developed, well-nourished, and in no distress.  HENT:  Head: Normocephalic.  Right Ear: Tympanic membrane, external ear and ear canal normal.  Left Ear: Tympanic membrane, external ear and ear canal normal.  Nose: Nose normal. No mucosal edema or rhinorrhea.  Mouth/Throat: Uvula is midline, oropharynx is clear and moist and mucous membranes are normal. No oropharyngeal exudate.  Eyes: Conjunctivae are normal.  Neck: Trachea normal. No tracheal tenderness present. No tracheal deviation present. No thyromegaly present.  Cardiovascular: Normal rate, regular rhythm, S1 normal, S2 normal and normal heart sounds.   No murmur heard. Pulmonary/Chest: Breath sounds normal. No stridor. No respiratory distress. She has no wheezes. She has no rales.  Musculoskeletal: She exhibits no edema.  Lymphadenopathy:       Head (right side): No tonsillar adenopathy present.       Head (left side): No tonsillar adenopathy present.    She has no cervical adenopathy.  Neurological: She is alert. Gait normal.  Skin: No rash noted. She is not diaphoretic. No erythema. Nails show no clubbing.  Psychiatric: Mood and affect normal.    Diagnostics:    Spirometry was performed and demonstrated an FEV1 of 1.87 at 97 % of predicted.  The patient had an Asthma Control Test with the following results: ACT Total Score: 24.    Assessment and Plan:   1. Asthma, moderate persistent, well-controlled   2. Allergic rhinoconjunctivitis   3. LPRD (laryngopharyngeal reflux disease)     1. Continue Symbicort 160-2 inhalations inhalations one-2 times a day  2. Continue Xopenex HFA 2 puffs every 4-6 hours if  needed  3. Continue montelukast 10 mg daily  4. Continue omeprazole 40 mg twice a day  5. Continue antihistamine if needed  6. Obtain fall flu vaccine  7. Return to clinic in 1 year or earlier if problem   Overall Cindy Arias has really been doing quite well on her current plan and I see no need for altering this plan at this point in time. She will return to this clinic in approximately one year or earlier if there is a problem.She is certainly welcome to contact me should there be a significant issue as we move forward.  Allena Katz, MD [place]

## 2016-05-08 NOTE — Patient Instructions (Addendum)
  1. Continue Symbicort 160-2 inhalations inhalations one-2 times a day  2. Continue Xopenex HFA 2 puffs every 4-6 hours if needed  3. Continue montelukast 10 mg daily  4. Continue omeprazole 40 mg twice a day  5. Continue antihistamine if needed  6. Obtain fall flu vaccine  7. Return to clinic in 1 year or earlier if problem

## 2016-05-09 ENCOUNTER — Other Ambulatory Visit: Payer: Self-pay | Admitting: Orthopaedic Surgery

## 2016-05-09 DIAGNOSIS — M79671 Pain in right foot: Secondary | ICD-10-CM

## 2016-05-15 ENCOUNTER — Ambulatory Visit
Admission: RE | Admit: 2016-05-15 | Discharge: 2016-05-15 | Disposition: A | Discharge status: Home / Self Care | Payer: Medicare Other | Source: Ambulatory Visit | Attending: Orthopaedic Surgery | Admitting: Orthopaedic Surgery

## 2016-05-15 DIAGNOSIS — M79671 Pain in right foot: Secondary | ICD-10-CM

## 2016-07-05 ENCOUNTER — Other Ambulatory Visit: Payer: Self-pay | Admitting: *Deleted

## 2016-07-05 MED ORDER — OMEPRAZOLE 40 MG PO CPDR: DELAYED_RELEASE_CAPSULE | ORAL | 5 refills | Status: DC

## 2016-10-17 ENCOUNTER — Other Ambulatory Visit: Payer: Self-pay | Admitting: Radiology

## 2016-11-01 ENCOUNTER — Ambulatory Visit: Payer: Self-pay | Admitting: General Surgery

## 2016-11-01 DIAGNOSIS — C50911 Malignant neoplasm of unspecified site of right female breast: Secondary | ICD-10-CM

## 2016-11-05 ENCOUNTER — Telehealth: Payer: Self-pay | Admitting: Oncology

## 2016-11-05 ENCOUNTER — Encounter: Payer: Self-pay | Admitting: Oncology

## 2016-11-05 NOTE — Telephone Encounter (Signed)
Pt cld to schedule an appt w/Dr. Jana Hakim on 1/17 at 4pm. Pt aware to arrive 30 minutes early. Demographics verified. Letter mailed.

## 2016-11-06 ENCOUNTER — Ambulatory Visit: Payer: Medicare Other | Attending: General Surgery | Admitting: Physical Therapy

## 2016-11-06 DIAGNOSIS — R293 Abnormal posture: Secondary | ICD-10-CM | POA: Diagnosis present

## 2016-11-06 NOTE — Patient Instructions (Signed)
First of all, check with your insurance company to see if provider is in [place]                                            [address]  [place], [place] [address] [phone]    Does not file for insurance--- call for appointment with [facility]   (for wigs and compression sleeves and gloves)  [place], [place] [address] [phone]  Will file some insurances --- call for appointment   Second to [facility] (for mastectomy prosthetics and garments) [place], [place] [address] [phone] Will file some insurances --- call for appointment  [employer]  [address]  [place], [place] [address] [phone] Lower extremity garments  [place], owner [phone])   [phone] phone)    [phone] (fax) Will do home visit to measure for custom day and nighttime garments, can get pumps Will file insurance  [place] Sales rep:  [employer] [place]:  [phone] [email or web address] Biocompression pumps   [employer]  Sales rep: Donneta Romberg:  [phone] [email or web address] [place] Pulley and Flexitouch pumps    Other Resources: National Lymphedema Network:  www.lymphnet.org [email or web address] for patient articles and purchase a self manual lymph drainage DVD [email or web address] has informative articles.

## 2016-11-07 ENCOUNTER — Telehealth (INDEPENDENT_AMBULATORY_CARE_PROVIDER_SITE_OTHER): Payer: Self-pay | Admitting: Orthopaedic Surgery

## 2016-11-07 ENCOUNTER — Encounter: Payer: Self-pay | Admitting: Radiation Oncology

## 2016-11-07 ENCOUNTER — Ambulatory Visit: Payer: Medicare Other | Admitting: Physical Therapy

## 2016-11-07 NOTE — Therapy (Signed)
[place], [place], [address] Phone: [phone]   Fax:  [phone]  Physical Therapy Evaluation  Patient Details  Name: Cindy Arias MRN: [id number] Date of Birth: July 21, 1946 Referring Provider: Dr. Excell Seltzer   Encounter Date: 11/06/2016      PT End of Session - 11/07/16 1307    Visit Number 1   Number of Visits 1   PT Start Time 2353   PT Stop Time 1600   PT Time Calculation (min) 45 min   Activity Tolerance Patient tolerated treatment well   Behavior During Therapy [facility] for tasks assessed/performed      Past Medical History:  Diagnosis Date  . Asthma   . Colitis   . Esophageal stricture   . GERD (gastroesophageal reflux disease)   . History of infertility   . Osteopenia   . Pneumonia     Past Surgical History:  Procedure Laterality Date  . BUNIONECTOMY WITH HAMMERTOE RECONSTRUCTION Left 07/2014  . ESOPHAGEAL DILATION    . Repair of Chiari Malformation  02/05/13  . SUBOCCIPITAL CRANIECTOMY CERVICAL LAMINECTOMY  02/13/2012   Procedure: SUBOCCIPITAL CRANIECTOMY CERVICAL LAMINECTOMY/DURAPLASTY;  Surgeon: Ophelia Charter, MD;  Location: [place] [facility];  Service: Neurosurgery;  Laterality: N/A;  Suboccipital craniectomy with upper cervical laminectomy and duraplasty  . TUBAL LIGATION  1987    There were no vitals filed for this visit.       Subjective Assessment - 11/06/16 1527    Subjective My surgery is January 24    Pertinent History pt has not shoulder issues .     Patient Stated Goals to find out what she needs to know.                LYMPHEDEMA/ONCOLOGY QUESTIONNAIRE - 11/06/16 1555      Right Upper Extremity Lymphedema   10 cm Proximal to Olecranon Process 25 cm   Olecranon Process 23.5 cm   15 cm Proximal to Ulnar Styloid Process 24 cm   Just Proximal to Ulnar Styloid Process 15.5 cm   Across Hand at [employer] 18.5 cm   At [place] of 2nd Digit 6.1 cm     Left Upper Extremity Lymphedema   10 cm Proximal to Olecranon Process 25 cm   Olecranon Process 24 cm   15 cm Proximal to Ulnar Styloid Process 23 cm   Just Proximal to Ulnar Styloid Process 15.5 cm   Across Hand at [employer] 18.5 cm   At [place] of 2nd Digit 6 cm           Quick Dash - 11/07/16 0001    Open a tight or new jar Mild difficulty   Do heavy household chores (wash walls, wash floors) No difficulty   Carry a shopping bag or briefcase No difficulty   Wash your back No difficulty   Use a knife to cut food No difficulty   Recreational activities in which you take some force or impact through your arm, shoulder, or hand (golf, hammering, tennis) No difficulty   During the past week, to what extent has your arm, shoulder or hand problem interfered with your normal social activities with family, friends, neighbors, or groups? Not at all   During the past week, to what extent has your arm, shoulder or hand problem limited your work or other regular daily activities Not at all   Arm, shoulder, or hand pain. None   Tingling (pins and needles) in your  arm, shoulder, or hand None   Difficulty Sleeping No difficulty   DASH Score 2.27 %             [facility] Adult PT Treatment/Exercise - 11/07/16 0001      Self-Care   Self-Care Other Self-Care Comments   Other Self-Care Comments  pt educated about post op exercises, ABC class, where to get a compression sleeve when needed for flying later                 PT Education - 11/07/16 1303    Education provided Yes   Education Details post op exercise, ABC class for after surgery    Person(s) Educated Patient   Methods Explanation;Handout   Comprehension Verbalized understanding              Breast Clinic Goals - 11/07/16 1310      Patient will be able to verbalize understanding of pertinent lymphedema risk reduction practices relevant to her diagnosis specifically related to skin care.   Time 1   Period Days    Status Achieved     Patient will be able to return demonstrate and/or verbalize understanding of the post-op home exercise program related to regaining shoulder range of motion.   Time 1   Period Days   Status Achieved     Patient will be able to verbalize understanding of the importance of attending the postoperative After Breast Cancer Class for further lymphedema risk reduction education and therapeutic exercise.   Time 1   Period Days   Status Achieved              Plan - 11/07/16 1307    Clinical Impression Statement Pt is preparing for right breast lumpectomy and sentinel node biopsy on Nov 21, 2016.  She was instructed in post op exercises, ABC class, brief lymphedema risk reduction  possible need for compression sleeve for airplane flight after surgery.  All her questions were answered.  Because of lack of comorbidities, this is a low complexity eval    Rehab Potential Good   Clinical Impairments Affecting Rehab Potential none   PT Frequency One time visit   PT Treatment/Interventions ADLs/Self Care Home Management;Patient/family education   PT Next Visit Plan one time visit   Consulted and Agree with Plan of Care Patient      Patient will benefit from skilled therapeutic intervention in order to improve the following deficits and impairments:  Postural dysfunction, Decreased knowledge of precautions, Decreased knowledge of use of DME  Visit Diagnosis: Abnormal posture - Plan: PT plan of care cert/re-cert      G-Codes - 16/96/78 1311    Functional Assessment Tool Used quick DASH   Functional Limitation Carrying, moving and handling objects   Carrying, Moving and Handling Objects Current Status (L3810) At least 1 percent but less than 20 percent impaired, limited or restricted   Carrying, Moving and Handling Objects Goal Status (F7510) At least 1 percent but less than 20 percent impaired, limited or restricted   Carrying, Moving and Handling Objects Discharge  Status [id number]) At least 1 percent but less than 20 percent impaired, limited or restricted       Problem List There are no active problems to display for this patient.  Donato Heinz. Owens Shark PT  Norwood Levo 11/07/2016, 1:14 PM  [place], [place], [address] Phone: [phone]   Fax:  [phone]  Name: Cindy Arias MRN: [id number] Date  of Birth: 07-Nov-1945

## 2016-11-07 NOTE — Telephone Encounter (Signed)
Patient calling about [place] Rx for L knee.  She states [facility] has been trying to get a response for INS AUTHORIZATION .  Patient is leaving to go out of town tomorrow.... Needs TODAY.

## 2016-11-08 NOTE — Progress Notes (Signed)
Location of Breast Cancer: Right Breast  Histology per Pathology Report:  10/17/16 Diagnosis Breast, right, needle core biopsy - INVASIVE DUCTAL CARCINOMA.  Receptor Status: ER(100%), PR (95%), Her2-neu (NEG), Ki-(10%)  Did patient present with symptoms or was this found on screening mammography?: It was found on a screening mammogram.   Past/Anticipated interventions by surgeon, if any: Surgery scheduled 11/21/16 by Dr. Excell Seltzer  Past/Anticipated interventions by medical oncology, if any: Dr. Jana Hakim 11/14/16  Lymphedema issues, if any:  N/A  Pain issues, if any:  She denies.   SAFETY ISSUES:  Prior radiation? No   Pacemaker/ICD? No  Possible current pregnancy? No  Is the patient on methotrexate? No  Current Complaints / other details:    BP 137/74   Pulse 64   Temp 98.2 F (36.8 C)   Ht 4' 10.75" (1.492 m)   Wt 125 lb (56.7 kg)   LMP 02/13/2012   SpO2 100% Comment: room air  BMI 25.46 kg/m    Wt Readings from Last 3 Encounters:  11/13/16 125 lb (56.7 kg)  01/17/15 117 lb (53.1 kg)  12/28/13 119 lb (54 kg)      Meah Jiron, Stephani Police, RN 11/08/2016,11:52 AM

## 2016-11-09 ENCOUNTER — Other Ambulatory Visit (INDEPENDENT_AMBULATORY_CARE_PROVIDER_SITE_OTHER): Payer: Self-pay

## 2016-11-09 MED ORDER — DICLOFENAC SODIUM 1 % TD GEL: 2.0000 g | Freq: Four times a day (QID) | TRANSDERMAL | 1 refills | Status: DC

## 2016-11-09 NOTE — Telephone Encounter (Deleted)
Left VM for pt that her RX has been called in to [facility]

## 2016-11-09 NOTE — Telephone Encounter (Signed)
Patient needs a refill of this medication, please advise.

## 2016-11-09 NOTE — Telephone Encounter (Signed)
Called in per PW

## 2016-11-13 ENCOUNTER — Other Ambulatory Visit: Payer: Self-pay | Admitting: *Deleted

## 2016-11-13 ENCOUNTER — Ambulatory Visit
Admission: RE | Admit: 2016-11-13 | Discharge: 2016-11-13 | Disposition: A | Discharge status: Home / Self Care | Payer: Medicare Other | Source: Ambulatory Visit | Attending: Radiation Oncology | Admitting: Radiation Oncology

## 2016-11-13 ENCOUNTER — Telehealth (INDEPENDENT_AMBULATORY_CARE_PROVIDER_SITE_OTHER): Payer: Self-pay | Admitting: *Deleted

## 2016-11-13 ENCOUNTER — Encounter: Payer: Self-pay | Admitting: Radiation Oncology

## 2016-11-13 ENCOUNTER — Telehealth (INDEPENDENT_AMBULATORY_CARE_PROVIDER_SITE_OTHER): Payer: Self-pay | Admitting: Orthopaedic Surgery

## 2016-11-13 DIAGNOSIS — Z510 Encounter for antineoplastic radiation therapy: Secondary | ICD-10-CM | POA: Diagnosis not present

## 2016-11-13 DIAGNOSIS — Z170 Estrogen receptor positive status [ER+]: Secondary | ICD-10-CM | POA: Diagnosis not present

## 2016-11-13 DIAGNOSIS — C50111 Malignant neoplasm of central portion of right female breast: Secondary | ICD-10-CM | POA: Insufficient documentation

## 2016-11-13 DIAGNOSIS — Z79899 Other long term (current) drug therapy: Secondary | ICD-10-CM | POA: Diagnosis not present

## 2016-11-13 DIAGNOSIS — Z880 Allergy status to penicillin: Secondary | ICD-10-CM | POA: Insufficient documentation

## 2016-11-13 DIAGNOSIS — Z888 Allergy status to other drugs, medicaments and biological substances status: Secondary | ICD-10-CM | POA: Diagnosis not present

## 2016-11-13 DIAGNOSIS — K219 Gastro-esophageal reflux disease without esophagitis: Secondary | ICD-10-CM | POA: Insufficient documentation

## 2016-11-13 DIAGNOSIS — M8580 Other specified disorders of bone density and structure, unspecified site: Secondary | ICD-10-CM | POA: Insufficient documentation

## 2016-11-13 DIAGNOSIS — Z7951 Long term (current) use of inhaled steroids: Secondary | ICD-10-CM | POA: Insufficient documentation

## 2016-11-13 DIAGNOSIS — J45909 Unspecified asthma, uncomplicated: Secondary | ICD-10-CM | POA: Insufficient documentation

## 2016-11-13 DIAGNOSIS — Z7982 Long term (current) use of aspirin: Secondary | ICD-10-CM | POA: Insufficient documentation

## 2016-11-13 DIAGNOSIS — Z882 Allergy status to sulfonamides status: Secondary | ICD-10-CM | POA: Diagnosis not present

## 2016-11-13 DIAGNOSIS — Z87891 Personal history of nicotine dependence: Secondary | ICD-10-CM | POA: Insufficient documentation

## 2016-11-13 DIAGNOSIS — C50919 Malignant neoplasm of unspecified site of unspecified female breast: Secondary | ICD-10-CM

## 2016-11-13 DIAGNOSIS — Z823 Family history of stroke: Secondary | ICD-10-CM | POA: Diagnosis not present

## 2016-11-13 NOTE — Telephone Encounter (Signed)
[place] called and received prior auth for med at [facility].  Bend then left VM on answering machine.

## 2016-11-13 NOTE — Progress Notes (Signed)
Radiation Oncology         [phone] ________________________________  Initial outpatient Consultation  Name: Cindy Arias MRN: [id number]  Date: 11/13/2016  DOB: December 28, 1945  NW:GNFAOZH,YQMVH, MD  Excell Seltzer, MD   REFERRING PHYSICIAN: Excell Seltzer, MD  DIAGNOSIS:    ICD-9-CM ICD-10-CM   1. 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 female who presented with an abnormal right breast mammogram at [facility] in Dec 2017.  A central right breast mass was appreciated and on [place] this was 17m.  She did not feel anything in her breast.  Biopsy on 10-17-16 showed invasive ductal carcinoma with characteristics as described above in the diagnosis.  She has an appointment tomorrow with Dr. MJana Hakim    She has surgery (breast conserving) scheduled with Dr. HExcell Seltzer  She has concerns and disappointment regarding the wait period to see me in clinic today, as well as issues regarding scheduling and the coordination of care, since her diagnosis.  We spoke about this for a while, and she is open to talking further about this with the director of our department so that we may address her feedback in a most constructive way.  She denies pain, lymphedema, palpable masses. She is in her USOH.  PREVIOUS RADIATION THERAPY: No  PAST MEDICAL HISTORY:  has a past medical history of Asthma; Colitis; Esophageal stricture; GERD (gastroesophageal reflux disease); History of infertility; Osteopenia; and Pneumonia.    PAST SURGICAL HISTORY: Past Surgical History:  Procedure Laterality Date  . BUNIONECTOMY WITH HAMMERTOE RECONSTRUCTION Left 07/2014  . CATARACT EXTRACTION Right  10/2015  . ESOPHAGEAL DILATION    . Repair of Chiari Malformation  02/05/13  . SUBOCCIPITAL CRANIECTOMY CERVICAL LAMINECTOMY  02/13/2012   Procedure: SUBOCCIPITAL CRANIECTOMY CERVICAL LAMINECTOMY/DURAPLASTY;  Surgeon: JOphelia Charter MD;  [facility];  Service: Neurosurgery;  Laterality: N/A;  Suboccipital craniectomy with upper cervical laminectomy and duraplasty  . TUBAL LIGATION  1987    FAMILY HISTORY: family history includes Cancer in her mother; Diabetes in her maternal grandfather; Heart disease in her maternal grandfather and maternal grandmother; Hypertension in her maternal grandmother; Osteoporosis in her maternal grandmother; Rheum arthritis in her mother; Stroke in her father.  SOCIAL HISTORY:  reports that she has quit smoking. She has never used smokeless tobacco. She reports that she drinks about 0.6 oz of alcohol per week . She reports that she does not use drugs.  ALLERGIES: Sulfa antibiotics; Albuterol; and Penicillins  MEDICATIONS:  Current Outpatient Prescriptions  Medication Sig Dispense Refill  . aspirin 81 MG tablet Take 81 mg by mouth daily.    . budesonide-formoterol (SYMBICORT) 160-4.5 MCG/ACT inhaler Inhale 2 puffs into the lungs 2 (two) times daily. 1 Inhaler 5  . CALCIUM CITRATE-VITAMIN D PO Take 500 mg by mouth daily.    . cetirizine (ZYRTEC) 10 MG tablet Take 10 mg by mouth as needed for allergies.    . cholecalciferol (VITAMIN D) 1000 UNITS tablet Take 1,000 Units by mouth daily.    . hydrocortisone (ANUSOL-HC) 25 MG suppository Place 25 mg rectally 2 (two) times daily.    . hydrocortisone cream 1 % Apply 1 application topically 2 (two) times daily.    .Marland Kitchen  levalbuterol (XOPENEX HFA) 45 MCG/ACT inhaler Inhale 1-2 puffs into the lungs every 4 (four) hours as needed. For shortness of breath 1 Inhaler 5  . mesalamine (LIALDA) 1.2 G EC tablet Take 1.2 g by mouth daily with breakfast.     . montelukast (SINGULAIR) 10 MG tablet Take 1 tablet (10 mg total)  by mouth at bedtime. 30 tablet 5  . Multiple Vitamins-Iron (MULTIVITAMINS WITH IRON) TABS Take 1 tablet by mouth daily.    Marland Kitchen omeprazole (PRILOSEC) 40 MG capsule Take one capsule twice daily as directed 60 capsule 5  . OVER THE COUNTER MEDICATION Take 1 tablet by mouth daily. Estrovera - for hotflashes    . sodium chloride (OCEAN) 0.65 % nasal spray Place 1 spray into the nose at bedtime as needed. For congestion    . triamcinolone cream (KENALOG) 0.1 % Apply 1 application topically as needed.    . vitamin E 400 UNIT capsule Take 400 Units by mouth daily.    . diclofenac sodium (VOLTAREN) 1 % GEL Apply 2 g topically 4 (four) times daily. (Patient not taking: Reported on 11/13/2016) 2 Tube 1   No current facility-administered medications for this encounter.     REVIEW OF SYSTEMS: As above   PHYSICAL EXAM:  height is 4' 10.75" (1.492 m) and weight is 125 lb (56.7 kg). Her temperature is 98.2 F (36.8 C). Her blood pressure is 137/74 and her pulse is 64. Her oxygen saturation is 100%.   General: Alert and oriented, in no acute distress HEENT: Head is normocephalic. Extraocular movements are intact. Oropharynx is clear. Neck: Neck is supple, no palpable cervical or supraclavicular lymphadenopathy. Heart: Regular in rate and rhythm with no murmurs, rubs, or gallops. Chest: Clear to auscultation bilaterally, with no rhonchi, wheezes, or rales. Abdomen: Soft, nontender, nondistended, with no rigidity or guarding. Extremities: No cyanosis or edema. Lymphatics: see Neck Exam Skin: No concerning lesions. Musculoskeletal: symmetric strength and muscle tone throughout. Neurologic: Cranial nerves II through XII are grossly intact. No obvious focalities. Speech is fluent. Coordination is intact. Psychiatric: Judgment and insight are intact. Affect is appropriate. Breasts: no palpable masses appreciated in the breasts or axillae bilaterally .  ECOG = 0  0 - Asymptomatic (Fully active, able to carry on  all predisease activities without restriction)  1 - Symptomatic but completely ambulatory (Restricted in physically strenuous activity but ambulatory and able to carry out work of a light or sedentary nature. For example, light housework, office work)  2 - Symptomatic, <50% in bed during the day (Ambulatory and capable of all self care but unable to carry out any work activities. Up and about more than 50% of waking hours)  3 - Symptomatic, >50% in bed, but not bedbound (Capable of only limited self-care, confined to bed or chair 50% or more of waking hours)  4 - Bedbound (Completely disabled. Cannot carry on any self-care. Totally confined to bed or chair)  5 - Death   Eustace Pen MM, Creech RH, Tormey DC, et al. [phone]). "Toxicity and response criteria of the [facility]". [place] Oncol. 5 (6): 649-55   LABORATORY DATA:  Lab Results  Component Value Date   WBC 7.2 02/06/2012   HGB 13.4 02/06/2012   HCT 40.7 02/06/2012   MCV 86.0 02/06/2012   PLT 256 02/06/2012   CMP     Component Value Date/Time   NA 137 02/06/2012 1042   K 4.1 02/06/2012 1042   CL 102 02/06/2012 1042  CO2 23 02/06/2012 1042   GLUCOSE 91 02/06/2012 1042   BUN 19 02/06/2012 1042   CREATININE 0.66 02/06/2012 1042   CALCIUM 9.4 02/06/2012 1042   GFRNONAA >90 02/06/2012 1042   GFRAA >90 02/06/2012 1042         RADIOGRAPHY: No results found.    IMPRESSION/PLAN: Right breast cancer, early stage.   It was a pleasure meeting the patient today. We discussed the risks, benefits, and side effects of radiotherapy. I recommend radiotherapy to the right breast to reduce her risk of locoregional recurrence by 2/3.  We discussed that radiation would take approximately 4-6 weeks to complete and that I would give the patient a few weeks to heal following surgery before starting treatment planning.  If chemotherapy were to be given, this would precede radiotherapy. We spoke about acute effects  including skin irritation and fatigue as well as much less common late effects including internal organ injury or irritation. We spoke about the latest technology that is used to minimize the risk of late effects for patients undergoing radiotherapy to the breast or chest wall. No guarantees of treatment were given. The patient is enthusiastic about proceeding with treatment. Consent signed today. I will await her referral back to me for postoperative follow-up and eventual CT simulation/treatment planning.  As above, she had concerns and disappointment regarding the wait period to see me in clinic today, as well as issues regarding scheduling and the coordination of care, since her diagnosis.  We spoke about this for a while, and she is open to talking further about this with the director of our department so that we may use her feedback in a constructive way.  She was grateful and appreciative of the time and attention that she received today, despite frustrations leading up to our face-to-face encounter.  I look forward to participating in her care.  __________________________________________   Eppie Gibson, MD

## 2016-11-13 NOTE — Telephone Encounter (Signed)
Patient is unable to get Pen[place]aid and is very upset that she has been waiting so long for this medication. [place] told patient the pre auth needed to be done and patient is questioning why this hasn't been done already. Patient is wanting something to be done today and is requesting a call back from the office about this.

## 2016-11-13 NOTE — Telephone Encounter (Signed)
PA for diclofenac approved, called [facility] w/PA #, lmom for patient diclofenac approved.

## 2016-11-14 ENCOUNTER — Other Ambulatory Visit: Payer: Medicare Other

## 2016-11-14 ENCOUNTER — Ambulatory Visit: Payer: Medicare Other | Admitting: Oncology

## 2016-11-14 ENCOUNTER — Telehealth: Payer: Self-pay | Admitting: Oncology

## 2016-11-14 NOTE — Telephone Encounter (Signed)
Appt has been rescheduled w/Dr. Jana Hakim to 1/22 @515pm . Pt agreed to the appt date and time.

## 2016-11-14 NOTE — Telephone Encounter (Signed)
Patient called to say that she is snowed in and will not be able to make her appointment today but she needs to see Dr Jana Hakim before next wedensday.  She is having surgery 1/24 at noon and wants to see him before then her call back number is [phone]

## 2016-11-15 ENCOUNTER — Encounter (HOSPITAL_BASED_OUTPATIENT_CLINIC_OR_DEPARTMENT_OTHER): Payer: Self-pay | Admitting: *Deleted

## 2016-11-19 ENCOUNTER — Ambulatory Visit (HOSPITAL_BASED_OUTPATIENT_CLINIC_OR_DEPARTMENT_OTHER): Payer: Medicare Other | Admitting: Oncology

## 2016-11-19 VITALS — BP 121/62 | HR 86 | Temp 98.3°F | Ht 58.75 in | Wt 121.6 lb

## 2016-11-19 DIAGNOSIS — C50811 Malignant neoplasm of overlapping sites of right female breast: Secondary | ICD-10-CM

## 2016-11-19 DIAGNOSIS — Z170 Estrogen receptor positive status [ER+]: Secondary | ICD-10-CM | POA: Insufficient documentation

## 2016-11-19 DIAGNOSIS — C50111 Malignant neoplasm of central portion of right female breast: Secondary | ICD-10-CM

## 2016-11-19 NOTE — Progress Notes (Addendum)
Cindy Arias  Telephone:([phone]) [phone] Fax:(336) [phone]     ID: Cindy Arias DOB: 02/28/1946  MR#: [id number]  ZJQ#:[id number]  Patient Care Team: Cindy Caraway, MD as PCP - General (Family Medicine) Cindy Cruel, MD as Consulting Physician (Oncology) Cindy Seltzer, MD as Consulting Physician (General Surgery) Cindy Gibson, MD as Attending Physician (Radiation Oncology) Cindy Mutter, MD as Consulting Physician (Ophthalmology) Cindy Prows, MD as Consulting Physician (Allergy and Immunology) Cindy Silence, MD as Consulting Physician (Gastroenterology) Cindy Cruel, MD OTHER MD:  CHIEF COMPLAINT: Estrogen receptor positive breast cancer  CURRENT TREATMENT: Awaiting definitive surgery   BREAST CANCER HISTORY: Cindy Arias had bilateral screening mammography with tomography at [facility] 10/04/2016 showing a possible new mass in the right breast central to the nipple. The breast density was category B. On 10/11/2016 she underwent ultrasonography of the right breast and axilla. This confirmed a 0.9 cm irregular mass in the central breast correlating with the mammography findings. The right axilla was sonographically benign.   On 10/17/2016 she underwent biopsy of the right breast mass in question, showing ([id number]) invasive ductal carcinoma, grade 2, estrogen receptor 100% positive, progesterone receptor 95% positive, both with strong staining intensity, with an MIB-1 of 10%, and no HER-2 amplification, the signals ratio 1.19 and the number per cell 1.90.  The patient's subsequent history is as detailed below  INTERVAL HISTORY: Cindy Arias was evaluated in the breast cancer clinic 11/19/2016, accompanied by her husband Cindy Arias. Her case was also presented in the multidisciplinary breast cancer conference 10/24/2016. At that time a preliminary plan was proposed: Lumpectomy with sentinel lymph node sampling, Oncotype, radiation and anti-estrogens  REVIEW OF  SYSTEMS: There were no specific symptoms leading to the original mammogram, which was routinely scheduled. The patient denies unusual headaches, visual changes, nausea, vomiting, stiff neck, dizziness, or gait imbalance. There has been no cough, phlegm production, or pleurisy, no chest pain or pressure, and no change in bowel or bladder habits. The patient denies fever, rash, bleeding, unexplained fatigue or unexplained weight loss. She has a history of colitis which is currently inactive. A detailed review of systems was otherwise entirely negative.   PAST MEDICAL HISTORY: Past Medical History:  Diagnosis Date  . Arthritis    "bone on bone'  left knee  . Asthma   . Colitis   . Dysphagia   . Esophageal stricture   . GERD (gastroesophageal reflux disease)   . History of infertility   . Osteopenia   . Pneumonia   . Rosacea   . Ulcerative colitis ([place])     PAST SURGICAL HISTORY: Past Surgical History:  Procedure Laterality Date  . BUNIONECTOMY WITH HAMMERTOE RECONSTRUCTION Left 07/2014  . CATARACT EXTRACTION Right 10/2015  . ESOPHAGEAL DILATION    . Repair of Chiari Malformation  02/05/13  . SUBOCCIPITAL CRANIECTOMY CERVICAL LAMINECTOMY  02/13/2012   Procedure: SUBOCCIPITAL CRANIECTOMY CERVICAL LAMINECTOMY/DURAPLASTY;  Surgeon: Ophelia Charter, MD;  Location: [place] [facility];  Service: Neurosurgery;  Laterality: N/A;  Suboccipital craniectomy with upper cervical laminectomy and duraplasty  . TUBAL LIGATION  1987    FAMILY HISTORY Family History  Problem Relation Age of Onset  . Cancer Mother     Liver- 2003 Lymphoma  . Rheum arthritis Mother   . Stroke Father   . Diabetes Maternal Grandfather     Type 2   . Heart disease Maternal Grandfather   . Hypertension Maternal Grandmother   . Heart disease Maternal Grandmother   . Osteoporosis Maternal Grandmother   .  Anesthesia problems Neg Hx   The patient's father died at the age of 60 FOLLOWING his stroke. The patient's mother died  of non-Hodgkin's lymphoma at the age of 31. The patient had no brothers, 1 sister. There is no history of breast or ovarian cancer in the family  GYNECOLOGIC HISTORY:  Patient's last menstrual period was 02/13/2012. Menarche age 20, first live birth age 54. The patient is GX P1. She went through the change of life in her 90s. She did not take hormone replacement.  SOCIAL HISTORY:  Cindy Arias work for [employer], for the [employer] agency in for [employer]. She is now retired and does a lot of volunteering. Her husband Cindy Arias is a retired Optometrist. They have been married 61 years as of January 2018. Their daughter Cindy Arias teaches special education in [place]. The patient has one grandchild. She attends [employer]: Social History  Substance Use Topics  . Smoking status: Never Smoker  . Smokeless tobacco: Never Used     Comment: she smoked for a few months in college  . Alcohol use 0.6 oz/week    1 Glasses of wine per week     Comment: 1 glass of wine socially once or twice a month     Colonoscopy: Due 2019  PAP: Up-to-date  Bone density: mild osteopenia   Allergies  Allergen Reactions  . Sulfa Antibiotics Hives  . Albuterol Other (See Comments)    States she cannot tolerate albuterol without side effects  . Penicillins Hives and Other (See Comments)    Had reaction as a child and as an adult. Had additional reaction but not clear on the specifics    Current Outpatient Prescriptions  Medication Sig Dispense Refill  . aspirin 81 MG tablet Take 81 mg by mouth daily.    . budesonide-formoterol (SYMBICORT) 160-4.5 MCG/ACT inhaler Inhale 2 puffs into the lungs 2 (two) times daily. 1 Inhaler 5  . CALCIUM CITRATE-VITAMIN D PO Take 500 mg by mouth daily.    . cetirizine (ZYRTEC) 10 MG tablet Take 10 mg by mouth as needed for allergies.    . cholecalciferol (VITAMIN D) 1000 UNITS tablet Take 1,000  Units by mouth daily.    . hydrocortisone cream 1 % Apply 1 application topically 2 (two) times daily.    Marland Kitchen levalbuterol (XOPENEX HFA) 45 MCG/ACT inhaler Inhale 1-2 puffs into the lungs every 4 (four) hours as needed. For shortness of breath 1 Inhaler 5  . mesalamine (LIALDA) 1.2 G EC tablet Take 1.2 g by mouth daily with breakfast.     . montelukast (SINGULAIR) 10 MG tablet Take 1 tablet (10 mg total) by mouth at bedtime. 30 tablet 5  . Multiple Vitamins-Iron (MULTIVITAMINS WITH IRON) TABS Take 1 tablet by mouth daily.    . Nutritional Supplements (ESTROVEN PO) Take by mouth.    . triamcinolone cream (KENALOG) 0.1 % Apply 1 application topically as needed.    . vitamin E 400 UNIT capsule Take 400 Units by mouth daily.    . diclofenac sodium (VOLTAREN) 1 % GEL Apply 2 g topically 4 (four) times daily. (Patient not taking: Reported on 11/19/2016) 2 Tube 1  . hydrocortisone (ANUSOL-HC) 25 MG suppository Place 25 mg rectally 2 (two) times daily.    Marland Kitchen omeprazole (PRILOSEC) 40 MG capsule Take one capsule twice daily as directed 60 capsule 5  . sodium chloride (OCEAN) 0.65 % nasal spray Place  1 spray into the nose at bedtime as needed. For congestion     No current facility-administered medications for this visit.     OBJECTIVE: Middle-aged white woman who appears stated age 44:   11/19/16 1705  BP: 121/62  Pulse: 86  Temp: 98.3 F (36.8 C)     Body mass index is 24.77 kg/m.    ECOG FS:0 - Asymptomatic  Ocular: Sclerae unicteric, pupils equal, round and reactive to light Ear-nose-throat: Oropharynx clear and moist Lymphatic: No cervical or supraclavicular adenopathy Lungs no rales or rhonchi, good excursion bilaterally Heart regular rate and rhythm, no murmur appreciated Abd soft, nontender, positive bowel sounds MSK no focal spinal tenderness, no joint edema Neuro: non-focal, well-oriented, appropriate affect Breasts: The right breast is status post recent seed implant and has the  marker in place. The right axilla is benign. The left breast is unremarkable.   LAB RESULTS:  CMP     Component Value Date/Time   NA 137 02/06/2012 1042   K 4.1 02/06/2012 1042   CL 102 02/06/2012 1042   CO2 23 02/06/2012 1042   GLUCOSE 91 02/06/2012 1042   BUN 19 02/06/2012 1042   CREATININE 0.66 02/06/2012 1042   CALCIUM 9.4 02/06/2012 1042   GFRNONAA >90 02/06/2012 1042   GFRAA >90 02/06/2012 1042    INo results found for: SPEP, UPEP  Lab Results  Component Value Date   WBC 7.2 02/06/2012   HGB 13.4 02/06/2012   HCT 40.7 02/06/2012   MCV 86.0 02/06/2012   PLT 256 02/06/2012      Chemistry      Component Value Date/Time   NA 137 02/06/2012 1042   K 4.1 02/06/2012 1042   CL 102 02/06/2012 1042   CO2 23 02/06/2012 1042   BUN 19 02/06/2012 1042   CREATININE 0.66 02/06/2012 1042      Component Value Date/Time   CALCIUM 9.4 02/06/2012 1042       No results found for: LABCA2  No components found for: LABCA125  No results for input(s): INR in the last 168 hours.  Urinalysis No results found for: COLORURINE, APPEARANCEUR, LABSPEC, PHURINE, GLUCOSEU, HGBUR, BILIRUBINUR, KETONESUR, PROTEINUR, UROBILINOGEN, NITRITE, LEUKOCYTESUR   STUDIES: Outside studies reviewed  ELIGIBLE FOR AVAILABLE RESEARCH PROTOCOL: no  ASSESSMENT: 71 y.o. [place] woman status post right breast overlapping sites biopsy 10/17/2016 for a clinical T1b N0, stage IA invasive ductal carcinoma, grade 2, estrogen and progesterone receptor positive, HER-2 nonamplified, with an MIB-1 of 10%  (1) breast conserving surgery with sentinel lymph node sampling planned  (2) consider Oncotype if the tumor proves to be greater than 1 cm or high-grade pathologically  (3) adjuvant radiation to follow  (4) anti-estrogens to follow at the completion of local treatment  PLAN: We spent the better part of today's hour-long appointment discussing the biology of breast cancer in general, and the  specifics of the patient's tumor in particular. We first reviewed the fact that cancer is not one disease but more than 100 different diseases and that it is important to keep them separate-- otherwise when friends and relatives discuss their own cancer experiences with [facility] confusion can result. Similarly we explained that if breast cancer spreads to the bone or liver, the patient would not have bone cancer or liver cancer, but breast cancer in the bone and breast cancer in the liver: one cancer in three places-- not 3 different cancers which otherwise would have to be treated in 3 different ways.  We discussed  the difference between local and systemic therapy. In terms of loco-regional treatment, lumpectomy plus radiation is equivalent to mastectomy as far as survival is concerned. For this reason, and because the cosmetic results are generally superior, we recommend breast conserving surgery.   We then discussed the rationale for systemic therapy. There is some risk that this cancer may have already spread to other parts of her body. Patients frequently ask at this point about bone scans, CAT scans and PET scans to find out if they have occult breast cancer somewhere else. The problem is that in early stage disease we are much more likely to find false positives then true cancers and this would expose the patient to unnecessary procedures as well as unnecessary radiation. Scans cannot answer the question the patient really would like to know, which is whether she has microscopic disease elsewhere in her body. For those reasons we do not recommend them.  Of course we would proceed to aggressive evaluation of any symptoms that might suggest metastatic disease, but that is not the case here.  Next we went over the options for systemic therapy which are anti-estrogens, anti-HER-2 immunotherapy, and chemotherapy. Brooke does not meet criteria for anti-HER-2 immunotherapy. She is a good candidate for  anti-estrogens.  The question of chemotherapy is more complicated. Chemotherapy is most effective in rapidly growing, aggressive tumors. It is much less effective in not high-grade, slow growing cancers, like Keleigh 's. For that reason an Oncotype from the definitive surgical sample, as suggested by NCCN guidelines, can be considered. However, in our experience, tumors less than a centimeter without high-grade features are almost invariably low grade.  Accordingly I am not planning to request an Oncotype unless there is a surprise in the pathology from Crystalann's upcoming surgery: If the tumor is greater than a centimeter or if it proves to be high-grade for example. My expectation is that she most likely will not require chemotherapy.  Accordingly the plan is for her to proceed to surgery this week. Barring surprises she will then proceed to radiation under Dr. Isidore Moos. Accordingly I am planning to see her in approximately 2 months, at which time we should be ready to start anti-estrogens.  Jadelyn has a good understanding of the overall plan. She agrees with it. She knows the goal of treatment in her case is cure. She will call with any problems that may develop before her next visit here.  Cindy Cruel, MD   11/19/2016 5:54 PM Medical Oncology and Hematology [facility] [address] [place], [place] [address] Tel. [phone]    Fax. [phone]

## 2016-11-19 NOTE — Progress Notes (Signed)
Boost drink given to patient with instructions to complete by [facility], pt verbalized understanding.

## 2016-11-20 ENCOUNTER — Telehealth: Payer: Self-pay | Admitting: *Deleted

## 2016-11-20 ENCOUNTER — Encounter: Payer: Self-pay | Admitting: *Deleted

## 2016-11-20 NOTE — H&P (Signed)
History of Present Illness Marland Kitchen T. Tanielle Emigh MD; 11/01/2016 10:31 AM) The patient is a 71 year old female who presents with breast cancer. She is a very pleasant post menopausal female referred by Dr. Isaiah Blakes for evaluation of recently diagnosed carcinoma of the right breast. She recently presented for a screening mamogram revealing a possible right breast mass. Subsequent imaging included ultrasound showing a 9 mm irregular mass in the central right breast middle depth. An ultrasound guided breast biopsy was performed on October 17, 2016 with pathology revealing invasive ductal carcinoma of the breast. She is seen now in the office for initial treatment planning. She has experienced no breast symptoms, specifically lump or nipple discharge, pain or skin changes. She does not have a personal history of any previous breast problems.  Findings at that time were the following: Tumor size: 0.9 cm Tumor grade: 2 Estrogen Receptor: 100% positive Progesterone Receptor: 95% positive Her-2 neu: Negative Lymph node status: Negative    Past Surgical History Malachy Moan, RMA; 11/01/2016 10:09 AM) Breast Biopsy  Right. Cataract Surgery  Right. Foot Surgery  Left. Oral Surgery   Diagnostic Studies History Malachy Moan, [place]; 11/01/2016 10:09 AM) Colonoscopy  5-10 years ago Mammogram  within last year Pap Smear  1-5 years ago  Allergies Malachy Moan, RMA; 11/01/2016 10:11 AM) Sulfa Antibiotics  Hives. Albuterol *ANTIASTHMATIC AND BRONCHODILATOR AGENTS*  Penicillins   Medication History Malachy Moan, RMA; 11/01/2016 10:16 AM) Omeprazole (40MG Capsule DR, Oral two times daily) Active. Symbicort (160-4.5MCG/ACT Aerosol, Inhalation two times daily) Active. Montelukast Sodium (10MG Tablet, Oral daily) Active. Lialda (1.2GM Tablet DR, Oral daily) Active. Pennsaid (2% Solution, Transdermal daily) Active. Saline (Nasal daily) Active. Xopenex HFA (45MCG/ACT Aerosol,  Inhalation as needed) Active. Hydrocortisone (Topical) (1% Cream, External as needed) Active. Vitamin D (1000UNIT Capsule, Oral daily) Active. Pennsaid (1.5% Solution, Transdermal two times daily) Active. Medications Reconciled  Social History Malachy Moan, [place]; 11/01/2016 10:09 AM) Alcohol use  Occasional alcohol use. Caffeine use  Coffee, Tea. No drug use  Tobacco use  Never smoker.  Family History Malachy Moan, [place]; 11/01/2016 10:09 AM) Alcohol Abuse  Father. Arthritis  Mother. Cancer  Mother. Cerebrovascular Accident  Father. Heart Disease  Father. Heart disease in female family member before age 63  Heart disease in female family member before age 30  Hypertension  Father. Melanoma  Daughter.  Pregnancy / Birth History Malachy Moan, [place]; 11/01/2016 10:09 AM) Age at menarche  68 years. Age of menopause  >62 Contraceptive History  Oral contraceptives. Gravida  2 Maternal age  31-30 Para  1  Other Problems Malachy Moan, [place]; 11/01/2016 10:09 AM) Asthma  Breast Cancer  Gastroesophageal Reflux Disease  Lump In Breast     Review of Systems Malachy Moan RMA; 11/01/2016 10:09 AM) Skin Not Present- Change in Wart/Mole, Dryness, Hives, Jaundice, New Lesions, Non-Healing Wounds, Rash and Ulcer. HEENT Not Present- Earache, Hearing Loss, Hoarseness, Nose Bleed, Oral Ulcers, Ringing in the Ears, Seasonal Allergies, Sinus Pain, Sore Throat, Visual Disturbances, Wears glasses/contact lenses and Yellow Eyes. Respiratory Not Present- Bloody sputum, Chronic Cough, Difficulty Breathing, Snoring and Wheezing. Breast Present- Breast Mass. Not Present- Breast Pain, Nipple Discharge and Skin Changes. Cardiovascular Not Present- Chest Pain, Difficulty Breathing Lying Down, Leg Cramps, Palpitations, Rapid Heart Rate, Shortness of Breath and Swelling of Extremities. Gastrointestinal Not Present- Abdominal Pain, Bloating, Bloody Stool, Change in Bowel  Habits, Chronic diarrhea, Constipation, Difficulty Swallowing, Excessive gas, Gets full quickly at meals, Hemorrhoids, Indigestion, Nausea, Rectal Pain and Vomiting. Female Genitourinary  Not Present- Frequency, Nocturia, Painful Urination, Pelvic Pain and Urgency. Musculoskeletal Not Present- Back Pain, Joint Pain, Joint Stiffness, Muscle Pain, Muscle Weakness and Swelling of Extremities. Neurological Not Present- Decreased Memory, Fainting, Headaches, Numbness, Seizures, Tingling, Tremor, Trouble walking and Weakness. Psychiatric Not Present- Anxiety, Bipolar, Change in Sleep Pattern, Depression, Fearful and Frequent crying. Endocrine Not Present- Cold Intolerance, Excessive Hunger, Hair Changes, Heat Intolerance, Hot flashes and New Diabetes. Hematology Not Present- Blood Thinners, Easy Bruising, Excessive bleeding, Gland problems, HIV and Persistent Infections.  Vitals Malachy Moan RMA; 11/01/2016 10:16 AM) 11/01/2016 10:16 AM Weight: 119.6 lb Temp.: 97.27F  Pulse: 69 (Regular)  BP: 134/80 (Sitting, Left Arm, Standard)       Physical Exam Marland Kitchen T. Dennys Traughber MD; 11/01/2016 10:44 AM) The physical exam findings are as follows: Note:General: Alert, well-developed and well nourished Caucasian female, in no distress Skin: Warm and dry without rash or infection. HEENT: No palpable masses or thyromegaly. Sclera nonicteric. Pupils equal round and reactive. Lymph nodes: No cervical, supraclavicular, nodes palpable. Breasts: No palpable masses in either breast. No skin changes or nipple inversion or crusting. No palpable axillary adenopathy. Lungs: Breath sounds clear and equal. No wheezing or increased work of breathing. Cardiovascular: Regular rate and rhythm without murmer. No JVD or edema. Extremities: No edema or joint swelling or deformity. No chronic venous stasis changes. Neurologic: Alert and fully oriented. Gait normal. No focal weakness. Psychiatric: Normal mood and affect.  Thought content appropriate with normal judgement and insight    Assessment & Plan Marland Kitchen T. Reeve Mallo MD; 11/01/2016 10:48 AM) MALIGNANT NEOPLASM OF RIGHT BREAST, STAGE 1, ESTROGEN RECEPTOR POSITIVE (C50.911) Impression: 71 year old female with a new diagnosis of cancer of the right breast, central quadrant. Clinical stage IA, ER pos, PR pos, HER-2 neg. I discussed with the patient and family members present today initial surgical treatment options. We discussed options of breast conservation with lumpectomy or total mastectomy and sentinal lymph node biopsy/dissection. Options for reconstruction were discussed. After discussion they have elected to proceed with breast conservation with lumpectomy and sentinel lymph node biopsy. We discussed the indications and nature of the procedure, and expected recovery, in detail. Surgical risks including anesthetic complications, cardiorespiratory complications, bleeding, infection, wound healing complications, blood clots, lymphedema, local and distant recurrence and possible need for further surgery based on the final pathology was discussed and understood. Chemotherapy, hormonal therapy and radiation therapy have been discussed. They have been provided with literature regarding the treatment of breast cancer. All questions were answered. They understand and agree to proceed and we will go ahead with scheduling. Current Plans Referred to Oncology, for evaluation and follow up (Oncology). Routine. Referred to Radiation Oncology, for evaluation and follow up (Radiation Oncology). Routine. Referred to Physical Therapy, for evaluation and follow up (Physical Therapy). Routine. Radioactive seed localized right breast lumpectomy and right axillary sentinel lymph node biopsy under general anesthesia as an outpatient

## 2016-11-20 NOTE — Telephone Encounter (Signed)
Pt return call, denies questions or concerns regarding dx or treatment care plan. Discussed next steps and plan for after sx. Encourage pt to call with need. Received verbal understanding. Contact information provided.

## 2016-11-21 ENCOUNTER — Ambulatory Visit (HOSPITAL_BASED_OUTPATIENT_CLINIC_OR_DEPARTMENT_OTHER)
Admission: RE | Admit: 2016-11-21 | Discharge: 2016-11-21 | Disposition: A | Discharge status: Home / Self Care | Payer: Medicare Other | Source: Ambulatory Visit | Attending: General Surgery | Admitting: General Surgery

## 2016-11-21 ENCOUNTER — Ambulatory Visit (HOSPITAL_BASED_OUTPATIENT_CLINIC_OR_DEPARTMENT_OTHER): Payer: Medicare Other | Admitting: Certified Registered"

## 2016-11-21 ENCOUNTER — Encounter (HOSPITAL_BASED_OUTPATIENT_CLINIC_OR_DEPARTMENT_OTHER): Payer: Self-pay | Admitting: Certified Registered"

## 2016-11-21 ENCOUNTER — Encounter (HOSPITAL_COMMUNITY)
Admission: RE | Admit: 2016-11-21 | Discharge: 2016-11-21 | Disposition: A | Discharge status: Home / Self Care | Payer: Medicare Other | Source: Ambulatory Visit | Attending: General Surgery | Admitting: General Surgery

## 2016-11-21 ENCOUNTER — Encounter (HOSPITAL_BASED_OUTPATIENT_CLINIC_OR_DEPARTMENT_OTHER)
Admission: RE | Disposition: A | Discharge status: Home / Self Care | Payer: Self-pay | Source: Ambulatory Visit | Attending: General Surgery

## 2016-11-21 ENCOUNTER — Telehealth: Payer: Self-pay | Admitting: Oncology

## 2016-11-21 DIAGNOSIS — Z811 Family history of alcohol abuse and dependence: Secondary | ICD-10-CM | POA: Insufficient documentation

## 2016-11-21 DIAGNOSIS — Z882 Allergy status to sulfonamides status: Secondary | ICD-10-CM | POA: Diagnosis not present

## 2016-11-21 DIAGNOSIS — Z808 Family history of malignant neoplasm of other organs or systems: Secondary | ICD-10-CM | POA: Insufficient documentation

## 2016-11-21 DIAGNOSIS — Z8249 Family history of ischemic heart disease and other diseases of the circulatory system: Secondary | ICD-10-CM | POA: Diagnosis not present

## 2016-11-21 DIAGNOSIS — Z880 Allergy status to penicillin: Secondary | ICD-10-CM | POA: Diagnosis not present

## 2016-11-21 DIAGNOSIS — Z8261 Family history of arthritis: Secondary | ICD-10-CM | POA: Diagnosis not present

## 2016-11-21 DIAGNOSIS — Z823 Family history of stroke: Secondary | ICD-10-CM | POA: Diagnosis not present

## 2016-11-21 DIAGNOSIS — Z170 Estrogen receptor positive status [ER+]: Secondary | ICD-10-CM | POA: Diagnosis not present

## 2016-11-21 DIAGNOSIS — K219 Gastro-esophageal reflux disease without esophagitis: Secondary | ICD-10-CM | POA: Insufficient documentation

## 2016-11-21 DIAGNOSIS — J45909 Unspecified asthma, uncomplicated: Secondary | ICD-10-CM | POA: Insufficient documentation

## 2016-11-21 DIAGNOSIS — Z888 Allergy status to other drugs, medicaments and biological substances status: Secondary | ICD-10-CM | POA: Diagnosis not present

## 2016-11-21 DIAGNOSIS — C50911 Malignant neoplasm of unspecified site of right female breast: Secondary | ICD-10-CM | POA: Diagnosis present

## 2016-11-21 DIAGNOSIS — N6011 Diffuse cystic mastopathy of right breast: Secondary | ICD-10-CM | POA: Diagnosis not present

## 2016-11-21 DIAGNOSIS — Z9889 Other specified postprocedural states: Secondary | ICD-10-CM | POA: Insufficient documentation

## 2016-11-21 DIAGNOSIS — Z79899 Other long term (current) drug therapy: Secondary | ICD-10-CM | POA: Diagnosis not present

## 2016-11-21 DIAGNOSIS — C50811 Malignant neoplasm of overlapping sites of right female breast: Secondary | ICD-10-CM

## 2016-11-21 HISTORY — DX: Rosacea, unspecified: L71.9

## 2016-11-21 HISTORY — DX: Ulcerative colitis, unspecified, without complications: K51.90

## 2016-11-21 HISTORY — DX: Dysphagia, unspecified: R13.10

## 2016-11-21 HISTORY — DX: Unspecified osteoarthritis, unspecified site: M19.90

## 2016-11-21 HISTORY — PX: BREAST LUMPECTOMY WITH RADIOACTIVE SEED AND SENTINEL LYMPH NODE BIOPSY: SHX6550

## 2016-11-21 SURGERY — BREAST LUMPECTOMY WITH RADIOACTIVE SEED AND SENTINEL LYMPH NODE BIOPSY
Anesthesia: General | Site: Breast | Laterality: Right

## 2016-11-21 MED ORDER — EPHEDRINE SULFATE 50 MG/ML IJ SOLN
INTRAMUSCULAR | Status: DC | PRN
  Administered 2016-11-21 (×2): 10 mg via INTRAVENOUS

## 2016-11-21 MED ORDER — CIPROFLOXACIN IN D5W 400 MG/200ML IV SOLN
INTRAVENOUS | Status: AC
  Filled 2016-11-21: qty 200

## 2016-11-21 MED ORDER — FENTANYL CITRATE (PF) 100 MCG/2ML IJ SOLN
50.0000 ug | INTRAMUSCULAR | Status: DC | PRN
  Administered 2016-11-21: 50 ug via INTRAVENOUS
  Administered 2016-11-21: 100 ug via INTRAVENOUS

## 2016-11-21 MED ORDER — DEXAMETHASONE SODIUM PHOSPHATE 4 MG/ML IJ SOLN
INTRAMUSCULAR | Status: DC | PRN
  Administered 2016-11-21: 10 mg via INTRAVENOUS

## 2016-11-21 MED ORDER — LIDOCAINE HCL (CARDIAC) 20 MG/ML IV SOLN
INTRAVENOUS | Status: DC | PRN
  Administered 2016-11-21: 30 mg via INTRAVENOUS

## 2016-11-21 MED ORDER — EPHEDRINE 5 MG/ML INJ
INTRAVENOUS | Status: AC
  Filled 2016-11-21: qty 10

## 2016-11-21 MED ORDER — LACTATED RINGERS IV SOLN
INTRAVENOUS | Status: DC
  Administered 2016-11-21 (×2): via INTRAVENOUS

## 2016-11-21 MED ORDER — PROPOFOL 10 MG/ML IV BOLUS
INTRAVENOUS | Status: DC | PRN
  Administered 2016-11-21: 150 mg via INTRAVENOUS

## 2016-11-21 MED ORDER — FENTANYL CITRATE (PF) 100 MCG/2ML IJ SOLN
INTRAMUSCULAR | Status: AC
  Filled 2016-11-21: qty 2

## 2016-11-21 MED ORDER — ACETAMINOPHEN 500 MG PO TABS
1000.0000 mg | ORAL_TABLET | ORAL | Status: AC
  Administered 2016-11-21: 1000 mg via ORAL

## 2016-11-21 MED ORDER — HYDROCODONE-ACETAMINOPHEN 5-325 MG PO TABS: 1.0000 | ORAL_TABLET | ORAL | 0 refills | Status: DC | PRN

## 2016-11-21 MED ORDER — ACETAMINOPHEN 500 MG PO TABS
ORAL_TABLET | ORAL | Status: AC
  Filled 2016-11-21: qty 2

## 2016-11-21 MED ORDER — FENTANYL CITRATE (PF) 100 MCG/2ML IJ SOLN: 25.0000 ug | INTRAMUSCULAR | Status: DC | PRN

## 2016-11-21 MED ORDER — TECHNETIUM TC 99M SULFUR COLLOID FILTERED
1.0000 | Freq: Once | INTRAVENOUS | Status: AC | PRN
  Administered 2016-11-21: 1 via INTRADERMAL

## 2016-11-21 MED ORDER — MEPERIDINE HCL 25 MG/ML IJ SOLN: 6.2500 mg | INTRAMUSCULAR | Status: DC | PRN

## 2016-11-21 MED ORDER — METOCLOPRAMIDE HCL 5 MG/ML IJ SOLN: 10.0000 mg | Freq: Once | INTRAMUSCULAR | Status: DC | PRN

## 2016-11-21 MED ORDER — GABAPENTIN 300 MG PO CAPS
ORAL_CAPSULE | ORAL | Status: AC
  Filled 2016-11-21: qty 1

## 2016-11-21 MED ORDER — MIDAZOLAM HCL 2 MG/2ML IJ SOLN
INTRAMUSCULAR | Status: AC
  Filled 2016-11-21: qty 2

## 2016-11-21 MED ORDER — EPHEDRINE 5 MG/ML INJ
INTRAVENOUS | Status: AC
  Filled 2016-11-21: qty 20

## 2016-11-21 MED ORDER — MIDAZOLAM HCL 2 MG/2ML IJ SOLN
1.0000 mg | INTRAMUSCULAR | Status: DC | PRN
  Administered 2016-11-21: 1 mg via INTRAVENOUS

## 2016-11-21 MED ORDER — LACTATED RINGERS IV SOLN: INTRAVENOUS | Status: DC

## 2016-11-21 MED ORDER — CHLORHEXIDINE GLUCONATE CLOTH 2 % EX PADS: 6.0000 | MEDICATED_PAD | Freq: Once | CUTANEOUS | Status: DC

## 2016-11-21 MED ORDER — BUPIVACAINE-EPINEPHRINE (PF) 0.5% -1:200000 IJ SOLN
INTRAMUSCULAR | Status: DC | PRN
  Administered 2016-11-21: 10 mL

## 2016-11-21 MED ORDER — BUPIVACAINE-EPINEPHRINE (PF) 0.5% -1:200000 IJ SOLN
INTRAMUSCULAR | Status: DC | PRN
  Administered 2016-11-21: 30 mL via PERINEURAL

## 2016-11-21 MED ORDER — CIPROFLOXACIN IN D5W 400 MG/200ML IV SOLN
400.0000 mg | INTRAVENOUS | Status: AC
  Administered 2016-11-21: 400 mg via INTRAVENOUS

## 2016-11-21 MED ORDER — GABAPENTIN 300 MG PO CAPS
300.0000 mg | ORAL_CAPSULE | ORAL | Status: AC
  Administered 2016-11-21: 300 mg via ORAL

## 2016-11-21 MED ORDER — ONDANSETRON HCL 4 MG/2ML IJ SOLN
INTRAMUSCULAR | Status: DC | PRN
  Administered 2016-11-21: 4 mg via INTRAVENOUS

## 2016-11-21 SURGICAL SUPPLY — 52 items
ADH SKN CLS APL DERMABOND .7 (GAUZE/BANDAGES/DRESSINGS) ×1
BINDER BREAST LRG (GAUZE/BANDAGES/DRESSINGS) IMPLANT
BINDER BREAST MEDIUM (GAUZE/BANDAGES/DRESSINGS) IMPLANT
BINDER BREAST XLRG (GAUZE/BANDAGES/DRESSINGS) IMPLANT
BINDER BREAST XXLRG (GAUZE/BANDAGES/DRESSINGS) IMPLANT
BLADE SURG 15 STRL LF DISP TIS (BLADE) ×1 IMPLANT
BLADE SURG 15 STRL SS (BLADE) ×3
CANISTER SUC SOCK COL 7IN (MISCELLANEOUS) IMPLANT
CANISTER SUCT 1200ML W/VALVE (MISCELLANEOUS) IMPLANT
CHLORAPREP W/TINT 26ML (MISCELLANEOUS) ×3 IMPLANT
CLIP TI WIDE RED SMALL 6 (CLIP) ×2 IMPLANT
COVER BACK TABLE 60X90IN (DRAPES) ×3 IMPLANT
COVER MAYO STAND STRL (DRAPES) ×3 IMPLANT
COVER PROBE W GEL 5X96 (DRAPES) ×3 IMPLANT
DECANTER SPIKE VIAL GLASS SM (MISCELLANEOUS) IMPLANT
DERMABOND ADVANCED (GAUZE/BANDAGES/DRESSINGS) ×2
DERMABOND ADVANCED .7 DNX12 (GAUZE/BANDAGES/DRESSINGS) ×1 IMPLANT
DEVICE DUBIN W/COMP PLATE 8390 (MISCELLANEOUS) ×3 IMPLANT
DRAPE LAPAROSCOPIC ABDOMINAL (DRAPES) ×3 IMPLANT
DRAPE UTILITY XL STRL (DRAPES) ×3 IMPLANT
ELECT COATED BLADE 2.86 ST (ELECTRODE) ×3 IMPLANT
ELECT REM PT RETURN 9FT ADLT (ELECTROSURGICAL) ×3
ELECTRODE REM PT RTRN 9FT ADLT (ELECTROSURGICAL) ×1 IMPLANT
GLOVE BIO SURGEON STRL SZ 6.5 (GLOVE) ×1 IMPLANT
GLOVE BIO SURGEONS STRL SZ 6.5 (GLOVE) ×1
GLOVE BIOGEL PI IND STRL 7.0 (GLOVE) IMPLANT
GLOVE BIOGEL PI IND STRL 8 (GLOVE) ×1 IMPLANT
GLOVE BIOGEL PI INDICATOR 7.0 (GLOVE) ×2
GLOVE BIOGEL PI INDICATOR 8 (GLOVE) ×4
GLOVE ECLIPSE 7.5 STRL STRAW (GLOVE) ×3 IMPLANT
GOWN STRL REUS W/ TWL LRG LVL3 (GOWN DISPOSABLE) ×1 IMPLANT
GOWN STRL REUS W/ TWL XL LVL3 (GOWN DISPOSABLE) ×1 IMPLANT
GOWN STRL REUS W/TWL LRG LVL3 (GOWN DISPOSABLE) ×3
GOWN STRL REUS W/TWL XL LVL3 (GOWN DISPOSABLE) ×3
ILLUMINATOR WAVEGUIDE N/F (MISCELLANEOUS) IMPLANT
KIT MARKER MARGIN INK (KITS) ×3 IMPLANT
NDL SAFETY ECLIPSE 18X1.5 (NEEDLE) ×1 IMPLANT
NEEDLE HYPO 18GX1.5 SHARP (NEEDLE)
NEEDLE HYPO 25X1 1.5 SAFETY (NEEDLE) ×3 IMPLANT
NS IRRIG 1000ML POUR BTL (IV SOLUTION) ×2 IMPLANT
PACK BASIN DAY SURGERY FS (CUSTOM PROCEDURE TRAY) ×3 IMPLANT
PENCIL BUTTON HOLSTER BLD 10FT (ELECTRODE) ×3 IMPLANT
SLEEVE SCD COMPRESS KNEE MED (MISCELLANEOUS) ×3 IMPLANT
SPONGE LAP 4X18 X RAY DECT (DISPOSABLE) ×3 IMPLANT
SUT MON AB 5-0 PS2 18 (SUTURE) ×3 IMPLANT
SUT VICRYL 3-0 CR8 SH (SUTURE) ×3 IMPLANT
SYR CONTROL 10ML LL (SYRINGE) ×4 IMPLANT
TOWEL OR 17X24 6PK STRL BLUE (TOWEL DISPOSABLE) ×5 IMPLANT
TOWEL OR NON WOVEN STRL DISP B (DISPOSABLE) ×3 IMPLANT
TUBE CONNECTING 20'X1/4 (TUBING) ×1
TUBE CONNECTING 20X1/4 (TUBING) ×1 IMPLANT
YANKAUER SUCT BULB TIP NO VENT (SUCTIONS) ×2 IMPLANT

## 2016-11-21 NOTE — Op Note (Signed)
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 After preoperative workup and discussion detailed elsewhere we have elected to proceed with radioactive seed localized right breast lumpectomy and axillary sentinel lymph node biopsy is initial surgical treatment.   Procedure Detail:  Patient had previously undergone placement of a radioactive seed in the right breast. See placement was confirmed in the holding area. She underwent preoperative injection of 1 mCi of technetium sulfur colloid intradermally around the right nipple and also underwent placement of a pectoral block by anesthesia. She was taken to the operating room, placed in the supine position on the operating table, and laryngeal mask general anesthesia induced. She was carefully positioned with her arm extended on an arm board and the entire right chest and breast, axilla and upper arm were widely sterilely prepped and draped. She received preoperative IV antibiotics. PAS were in place. Patient timeout was performed and correct procedure verified. Post seed mammogram showed the seed somewhat anterior and lateral to the marking clip. The seed was localized and a circumareolar incision used. Dissection was carried down into the subcutaneous tissue toward the breast capsule. As the seed was approached a specimen of breast tissue was excised in all directions with cautery purposely going somewhat deeper to the seed based on the seed placement mammogram. The specimen was removed and inked for margins. Specimen x-ray showed  the seed fairly centrally located within the specimen but the marking clip was not present. Examination of this x-ray and the preop x-ray and palpation within the breast indicated the tumor was likely just medial to the tissue I had excised. This area was exposed and a further fairly generous specimen of tissue was excised medially and I did feel a discrete palpable mass within the specimen. This was removed and the new medial margin inked. The remainder of the margins would've been encompassed by the original specimen. Specimen x-ray showed a discrete mass within the specimen but again no marking clip. I suspect at this point the marking clip had become displaced during the procedure. I sent this specimen for immediate pathologic examination which did reveal a discrete mass in the specimen and the pathologist felt it was somewhat close to the new medial margin. I then excised a small additional medial margin which was inked and oriented and sent for permanent pathology. This also was x-rayed in the room and did not contain the clip. I was confident we had a good excision of the mass and that again likely the biopsy clip had been displaced. Attention was turned to the sentinel lymph node biopsy. A hot area in the right axilla was localized and a small transverse incision made. Dissection was carried down through the separate saphenous tissue and clavipectoral fascia incised. Using the neoprobe for guidance I bluntly dissected down onto a slightly enlarged node with very high counts which was completely excised with cautery. Ex vivo counts were about 1600. I could not feel any enlarged nodes. Using the neoprobe I did locate a second node with elevated counts which was very small and excised and had counts ex vivo about 180. Background in the axilla this point  was about 80 and not really discretely localized. No palpable adenopathy. Both wounds were irrigated and hemostasis assured. Skin his lipid status tissue was  infiltrated with Marcaine with epinephrine. The lumpectomy cavity was marked with clips. The deep breast and saphenous tissue was closed with interrupted 3-0 Vicryl. The axillary and subcutaneous tissue at the sentinel node site was closed with interrupted 3-0 Vicryl and the skin incisions closed with subcutaneous tic or Monocryl and Dermabond. Sponge needle and instrument counts were correct.    Findings: As above  Estimated Blood Loss:  Minimal         Drains: None  Blood Given: none          Specimens: #1 right breast lumpectomy margins inked   #2 further medial breast tissue containing mass with new medial margin inked    #3 additional medial margin final margin inked    #4 axillary sentinel lymph nodes  X 2        Complications:  * No complications entered in OR log *         Disposition: PACU - hemodynamically stable.         Condition: stable

## 2016-11-21 NOTE — Telephone Encounter (Signed)
lvm to inform pt of 01/08/17 appt at 3 pm per LOS

## 2016-11-21 NOTE — Discharge Instructions (Signed)
Post Anesthesia Home Care Instructions  Activity: Get plenty of rest for the remainder of the day. A responsible adult should stay with you for 24 hours following the procedure.  For the next 24 hours, DO NOT: -Drive a car -Paediatric nurse -Drink alcoholic beverages -Take any medication unless instructed by your physician -Make any legal decisions or sign important papers.  Meals: Start with liquid foods such as gelatin or soup. Progress to regular foods as tolerated. Avoid greasy, spicy, heavy foods. If nausea and/or vomiting occur, drink only clear liquids until the nausea and/or vomiting subsides. Call your physician if vomiting continues.  Special Instructions/Symptoms: Your throat may feel dry or sore from the anesthesia or the breathing tube placed in your throat during surgery. If this causes discomfort, gargle with warm salt water. The discomfort should disappear within 24 hours.  If you had a scopolamine patch placed behind your ear for the management of post- operative nausea and/or vomiting:  1. The medication in the patch is effective for 72 hours, after which it should be removed.  Wrap patch in a tissue and discard in the trash. Wash hands thoroughly with soap and water. 2. You may remove the patch earlier than 72 hours if you experience unpleasant side effects which may include dry mouth, dizziness or visual disturbances. 3. Avoid touching the patch. Wash your hands with soap and water after contact with the patch.     [place] Office Phone Number [phone]  BREAST BIOPSY/ PARTIAL MASTECTOMY: POST OP INSTRUCTIONS  Always review your discharge instruction sheet given to you by the facility where your surgery was performed.  IF YOU HAVE DISABILITY OR FAMILY LEAVE FORMS, YOU MUST BRING THEM TO THE OFFICE FOR PROCESSING.  DO NOT GIVE THEM TO YOUR DOCTOR.  1. A prescription for pain medication may be given to you upon discharge.  Take your pain  medication as prescribed, if needed.  If narcotic pain medicine is not needed, then you may take acetaminophen (Tylenol) or ibuprofen (Advil) as needed. 2. Take your usually prescribed medications unless otherwise directed 3. If you need a refill on your pain medication, please contact your pharmacy.  They will contact our office to request authorization.  Prescriptions will not be filled after 5pm or on week-ends. 4. You should eat very light the first 24 hours after surgery, such as soup, crackers, pudding, etc.  Resume your normal diet the day after surgery. 5. Most patients will experience some swelling and bruising in the breast.  Ice packs and a good support bra will help.  Swelling and bruising can take several days to resolve.  6. It is common to experience some constipation if taking pain medication after surgery.  Increasing fluid intake and taking a stool softener will usually help or prevent this problem from occurring.  A mild laxative (Milk of Magnesia or Miralax) should be taken according to package directions if there are no bowel movements after 48 hours. 7. Unless discharge instructions indicate otherwise, you may remove your bandages 24-48 hours after surgery, and you may shower at that time.  You may have steri-strips (small skin tapes) in place directly over the incision.  These strips should be left on the skin for 7-10 days.  If your surgeon used skin glue on the incision, you may shower in 24 hours.  The glue will flake off over the next 2-3 weeks.  Any sutures or staples will be removed at the office during your follow-up visit. 8.  ACTIVITIES:  You may resume regular daily activities (gradually increasing) beginning the next day.  Wearing a good support bra or sports bra minimizes pain and swelling.  You may have sexual intercourse when it is comfortable. a. You may drive when you no longer are taking prescription pain medication, you can comfortably wear a seatbelt, and you can  safely maneuver your car and apply brakes. b. RETURN TO WORK:  ______________________________________________________________________________________ 9. You should see your doctor in the office for a follow-up appointment approximately two weeks after your surgery.  Your doctors nurse will typically make your follow-up appointment when she calls you with your pathology report.  Expect your pathology report 2-3 business days after your surgery.  You may call to check if you do not hear from [place] after three days. 10. OTHER INSTRUCTIONS: _______________________________________________________________________________________________ _____________________________________________________________________________________________________________________________________ _____________________________________________________________________________________________________________________________________ _____________________________________________________________________________________________________________________________________  WHEN TO CALL YOUR DOCTOR: 1. Fever over 101.0 2. Nausea and/or vomiting. 3. Extreme swelling or bruising. 4. Continued bleeding from incision. 5. Increased pain, redness, or drainage from the incision.  The clinic staff is available to answer your questions during regular business hours.  Please dont hesitate to call and ask to speak to one of the nurses for clinical concerns.  If you have a medical emergency, go to the nearest emergency room or call 911.  A surgeon from [facility] is always on call at the hospital.  For further questions, please visit [email or web address]

## 2016-11-21 NOTE — Progress Notes (Signed)
Assisted Dr. Marcell Barlow with right, ultrasound guided, pectoralis block. Side rails up, monitors on throughout procedure. See vital signs in flow sheet. Tolerated Procedure well.

## 2016-11-21 NOTE — Anesthesia Procedure Notes (Signed)
Anesthesia Regional Block:  Pectoralis block  Pre-Anesthetic Checklist: ,, timeout performed, Correct Patient, Correct Site, Correct Laterality, Correct Procedure, Correct Position, site marked, Risks and benefits discussed,  Surgical consent,  Pre-op evaluation,  At surgeon's request and post-op pain management  Laterality: Right  Prep: Maximum Sterile Barrier Precautions used, chloraprep       Needles:  Injection technique: Single-shot  Needle Type: Echogenic Stimulator Needle     Needle Length: 10cm 10 cm Needle Gauge: 21 G    Additional Needles:  Procedures: ultrasound guided (picture in chart) Pectoralis block Narrative:  Start time: 11/21/2016 11:34 AM End time: 11/21/2016 11:44 AM Injection made incrementally with aspirations every 5 mL.  Performed by: Personally  Anesthesiologist: Montez Hageman  Additional Notes: Risks, benefits and alternative to block explained extensively.  Patient tolerated procedure well, without complications.

## 2016-11-21 NOTE — Anesthesia Postprocedure Evaluation (Addendum)
Anesthesia Post Note  Patient: Cindy Arias  Procedure(s) Performed: Procedure(s) (LRB): BREAST LUMPECTOMY WITH RADIOACTIVE SEED AND SENTINEL LYMPH NODE BIOPSY (Right)  Patient location during evaluation: PACU Anesthesia Type: General Level of consciousness: awake and alert Pain management: pain level controlled Vital Signs Assessment: post-procedure vital signs reviewed and stable Respiratory status: spontaneous breathing, nonlabored ventilation, respiratory function stable and patient connected to nasal cannula oxygen Cardiovascular status: blood pressure returned to baseline and stable Postop Assessment: no signs of nausea or vomiting Anesthetic complications: no       Last Vitals:  Vitals:   11/21/16 1415 11/21/16 1430  BP: 122/65 136/76  Pulse: 76 84  Resp: 10 11  Temp:      Last Pain:  Vitals:   11/21/16 1430  TempSrc:   PainSc: 0-No pain                 Montez Hageman

## 2016-11-21 NOTE — Interval H&P Note (Signed)
History and Physical Interval Note:  11/21/2016 12:13 PM  Cindy Arias  has presented today for surgery, with the diagnosis of RIGHT BREAST CANCER  The various methods of treatment have been discussed with the patient and family. After consideration of risks, benefits and other options for treatment, the patient has consented to  Procedure(s): BREAST LUMPECTOMY WITH RADIOACTIVE SEED AND SENTINEL LYMPH NODE BIOPSY (Right) as a surgical intervention .  The patient's history has been reviewed, patient examined, no change in status, stable for surgery.  I have reviewed the patient's chart and labs.  Questions were answered to the patient's satisfaction.     Mehki Klumpp T

## 2016-11-21 NOTE — Progress Notes (Signed)
Emotional support given during breast injections with nuclear medicine.

## 2016-11-21 NOTE — Anesthesia Procedure Notes (Signed)
Procedure Name: LMA Insertion Date/Time: 11/21/2016 12:22 PM Performed by: Kinzlie Harney D Pre-anesthesia Checklist: Patient identified, Emergency Drugs available, Suction available and Patient being monitored Patient Re-evaluated:Patient Re-evaluated prior to inductionOxygen Delivery Method: Circle system utilized Preoxygenation: Pre-oxygenation with 100% oxygen Intubation Type: IV induction Ventilation: Mask ventilation without difficulty LMA: LMA inserted LMA Size: 3.0 Number of attempts: 1 Airway Equipment and Method: Bite block Placement Confirmation: positive ETCO2 Tube secured with: Tape Dental Injury: Teeth and Oropharynx as per pre-operative assessment

## 2016-11-21 NOTE — Anesthesia Preprocedure Evaluation (Signed)
Anesthesia Evaluation  Patient identified by MRN, date of birth, ID band Patient awake    Reviewed: Allergy & Precautions, NPO status , Patient's Chart, lab work & pertinent test results  Airway Mallampati: II  TM Distance: >3 FB Neck ROM: Full    Dental no notable dental hx.    Pulmonary asthma ,    Pulmonary exam normal breath sounds clear to auscultation       Cardiovascular negative cardio ROS Normal cardiovascular exam Rhythm:Regular Rate:Normal     Neuro/Psych negative neurological ROS  negative psych ROS   GI/Hepatic Neg liver ROS, GERD  Medicated and Controlled,  Endo/Other  negative endocrine ROS  Renal/GU negative Renal ROS  negative genitourinary   Musculoskeletal negative musculoskeletal ROS (+)   Abdominal   Peds negative pediatric ROS (+)  Hematology negative hematology ROS (+)   Anesthesia Other Findings   Reproductive/Obstetrics negative OB ROS                             Anesthesia Physical Anesthesia Plan  ASA: II  Anesthesia Plan: General   Post-op Pain Management:  Regional for Post-op pain   Induction: Intravenous  Airway Management Planned: LMA  Additional Equipment:   Intra-op Plan:   Post-operative Plan: Extubation in OR  Informed Consent: I have reviewed the patients History and Physical, chart, labs and discussed the procedure including the risks, benefits and alternatives for the proposed anesthesia with the patient or authorized representative who has indicated his/her understanding and acceptance.   Dental advisory given  Plan Discussed with: CRNA  Anesthesia Plan Comments: (Pec block)        Anesthesia Quick Evaluation

## 2016-11-22 ENCOUNTER — Encounter (HOSPITAL_BASED_OUTPATIENT_CLINIC_OR_DEPARTMENT_OTHER): Payer: Self-pay | Admitting: General Surgery

## 2016-11-22 NOTE — Transfer of Care (Signed)
Immediate Anesthesia Transfer of Care Note  Patient: Cindy Arias  Procedure(s) Performed: Procedure(s): BREAST LUMPECTOMY WITH RADIOACTIVE SEED AND SENTINEL LYMPH NODE BIOPSY (Right)  Patient Location: PACU  Anesthesia Type:General  Level of Consciousness: awake, alert , oriented and patient cooperative  Airway & Oxygen Therapy: Patient Spontanous Breathing and Patient connected to face mask oxygen  Post-op Assessment: Report given to RN and Post -op Vital signs reviewed and stable  Post vital signs: Reviewed and stable  Last Vitals:  Vitals:   11/21/16 1430 11/21/16 1519  BP: 136/76 135/77  Pulse: 84 83  Resp: 11 18  Temp:  36.6 C    Last Pain:  Vitals:   11/21/16 1519  TempSrc: Oral  PainSc:          Complications: No apparent anesthesia complications

## 2016-11-26 ENCOUNTER — Telehealth: Payer: Self-pay | Admitting: *Deleted

## 2016-11-26 NOTE — Telephone Encounter (Signed)
Ordered oncotype per Dr. Jana Hakim.  Faxed requisition to pathology and confirmed receipt.

## 2016-12-07 ENCOUNTER — Encounter (HOSPITAL_COMMUNITY): Payer: Self-pay

## 2016-12-07 ENCOUNTER — Telehealth: Payer: Self-pay | Admitting: *Deleted

## 2016-12-07 NOTE — Telephone Encounter (Signed)
Received Oncotype score of 16/10%. Physician team notified. Called pt with results and informed she does NOT need chemotherapy. Relate her next step is radiation with Dr. Isidore Moos and that she will receive a call for a f/u appt. Received verbal understanding. Denies further needs at this time.

## 2016-12-10 ENCOUNTER — Encounter: Payer: Self-pay | Admitting: Radiation Oncology

## 2016-12-17 NOTE — Progress Notes (Signed)
Location of Breast Cancer: Right Breast  Histology per Pathology Report:  10/17/16 Diagnosis Breast, right, needle core biopsy - INVASIVE DUCTAL CARCINOMA.  Receptor Status: ER(100%), PR (95%), Her2-neu (NEG), Ki-(10%)  11/21/16 Diagnosis 1. Breast, lumpectomy, Right w/seed - FIBROCYSTIC CHANGE. - NO MALIGNANCY IDENTIFIED. 2. Breast, excision, Right - INVASIVE DUCTAL CARCINOMA, GRADE 2, SPANNING 1.9 CM. - CARCINOMA IS <0.1 CM OF THE MEDIAL MARGIN OF PART #2. FINAL MARGIN PART #5 IS NEGATIVE. - SEE ONCOLOGY TABLE. 3. Lymph node, sentinel, biopsy, Right - ONE OF ONE LYMPH NODES NEGATIVE FOR CARCINOMA (0/1). 4. Lymph node, sentinel, biopsy, Right axillary - ONE OF ONE LYMPH NODES NEGATIVE FOR CARCINOMA (0/1). 5. Breast, excision, Right new medial margin - FIBROCYSTIC CHANGE. - NO MALIGNANCY IDENTIFIED.  Did patient present with symptoms or was this found on screening mammography?: It was found on a screening mammogram.   Past/Anticipated interventions by surgeon, if any: Procedure: Procedure(s): BREAST LUMPECTOMY WITH RADIOACTIVE SEED AND SENTINEL LYMPH NODE BIOPSY Surgeon: Excell Seltzer T   She saw Dr. Excell Seltzer last week, and he felt like she was healing well. She will see him again in 6 months.   Past/Anticipated interventions by medical oncology, if any: Dr. Jana Hakim 11/19/16 (1) breast conserving surgery with sentinel lymph node sampling planned (done 11/21/16) (2) consider Oncotype if the tumor proves to be greater than 1 cm or high-grade pathologically (she will not receive chemotherapy) (3) adjuvant radiation to follow (4) anti-estrogens to follow at the completion of local treatment   Lymphedema issues, if any: She denies. She has good arm mobility  Pain issues, if any:   She denies.   SAFETY ISSUES:  Prior radiation? No   Pacemaker/ICD? No  Possible current pregnancy? No  Is the patient on methotrexate? No  Current Complaints / other details:  BP  134/65   Pulse 71   Temp 98.2 F (36.8 C)   Ht 4' 10.75" (1.492 m)   Wt 122 lb (55.3 kg)   LMP 02/13/2012   SpO2 98% Comment: room air  BMI 24.85 kg/m     Wt Readings from Last 3 Encounters:  12/21/16 122 lb (55.3 kg)  11/21/16 120 lb 9.6 oz (54.7 kg)  11/19/16 121 lb 9.6 oz (55.2 kg)        Jakaiya Netherland, Stephani Police, RN 12/17/2016,10:14 AM

## 2016-12-18 ENCOUNTER — Ambulatory Visit (INDEPENDENT_AMBULATORY_CARE_PROVIDER_SITE_OTHER): Payer: Medicare Other | Admitting: Allergy and Immunology

## 2016-12-18 ENCOUNTER — Encounter: Payer: Self-pay | Admitting: Allergy and Immunology

## 2016-12-18 ENCOUNTER — Encounter (INDEPENDENT_AMBULATORY_CARE_PROVIDER_SITE_OTHER): Payer: Self-pay

## 2016-12-18 VITALS — BP 128/72 | HR 80 | Resp 18

## 2016-12-18 DIAGNOSIS — J3089 Other allergic rhinitis: Secondary | ICD-10-CM

## 2016-12-18 DIAGNOSIS — K219 Gastro-esophageal reflux disease without esophagitis: Secondary | ICD-10-CM | POA: Diagnosis not present

## 2016-12-18 DIAGNOSIS — J4541 Moderate persistent asthma with (acute) exacerbation: Secondary | ICD-10-CM | POA: Diagnosis not present

## 2016-12-18 MED ORDER — METHYLPREDNISOLONE ACETATE 80 MG/ML IJ SUSP
80.0000 mg | Freq: Once | INTRAMUSCULAR | Status: AC
  Administered 2016-12-18: 80 mg via INTRAMUSCULAR

## 2016-12-18 MED ORDER — RANITIDINE HCL 300 MG PO TABS: ORAL_TABLET | ORAL | 5 refills | Status: DC

## 2016-12-18 MED ORDER — LEVALBUTEROL TARTRATE 45 MCG/ACT IN AERO: INHALATION_SPRAY | RESPIRATORY_TRACT | 1 refills | Status: DC

## 2016-12-18 NOTE — Progress Notes (Signed)
Follow-up Note  Referring Provider: Cari Caraway, MD Primary Provider: Cari Caraway, MD Date of Office Visit: 12/18/2016  Subjective:   Cindy Arias (DOB: 29-Oct-1946) is a 71 y.o. female who returns to the [facility] [place] on 12/18/2016 in re-evaluation of the following:  HPI: Cindy Arias returns to this clinic in reevaluation of her asthma and allergic rhinoconjunctivitis and LPR. I last saw her in this clinic July 2017.  She has done wonderful with her asthma. She has not required a systemic steroid to treat an exacerbation and she can exercise without any difficulty and she rarely uses a short acting bronchodilator while using Symbicort only one time per day as well as montelukast daily.  Likewise, her upper airways are really been doing quite well and she has not required an antibiotic to treat an episode of sinusitis. She does not use a nasal steroid because this gives rise to epistaxis but for the most part she's done well while using some antihistamine.  Unfortunately, 10 days ago she became fatigued and had postnasal drip that she thought was somewhat greenish and then she developed cough. She's had some sneezing and nose blowing. She has not had any fever or chills or aches or anosmia or headaches. She has not had any posttussive emesis. She's been using a short acting bronchodilator which she is not sure has really helped her very much. She has not been around any obvious contacts that were infected. This cough is keeping her awake at nighttime.  For the most part her reflux is under excellent control while using her omeprazole twice a day. She's not been having any swallowing difficulties.  She did obtain the flu vaccine this fall.  Cindy Arias had a lumpectomy performed in evaluation and treatment of breast cancer and January 2018. Apparently based on pathology she will only require radiation for treatment.  Allergies as of 12/18/2016      Reactions   Sulfa  Antibiotics Hives   Albuterol Other (See Comments)   States she cannot tolerate albuterol without side effects   Penicillins Hives, Other (See Comments)   Had reaction as a child and as an adult. Had additional reaction but not clear on the specifics      Medication List      aspirin 81 MG tablet Take 81 mg by mouth daily.   budesonide-formoterol 160-4.5 MCG/ACT inhaler Commonly known as:  SYMBICORT Inhale 2 puffs into the lungs 2 (two) times daily.   CALCIUM CITRATE-VITAMIN D PO Take 500 mg by mouth daily.   cetirizine 10 MG tablet Commonly known as:  ZYRTEC Take 10 mg by mouth as needed for allergies.   cholecalciferol 1000 units tablet Commonly known as:  VITAMIN D Take 1,000 Units by mouth daily.   ESTROVEN PO Take by mouth.   hydrocortisone cream 1 % Apply 1 application topically 2 (two) times daily.   levalbuterol 45 MCG/ACT inhaler Commonly known as:  XOPENEX HFA Inhale 1-2 puffs into the lungs every 4 (four) hours as needed. For shortness of breath   mesalamine 1.2 g EC tablet Commonly known as:  LIALDA Take 1.2 g by mouth daily with breakfast.   montelukast 10 MG tablet Commonly known as:  SINGULAIR Take 1 tablet (10 mg total) by mouth at bedtime.   multivitamins with iron Tabs tablet Take 1 tablet by mouth daily.   omeprazole 40 MG capsule Commonly known as:  PRILOSEC Take one capsule twice daily as directed   PENNSAID TD Place onto  the skin 2 (two) times daily.   sodium chloride 0.65 % nasal spray Commonly known as:  OCEAN Place 1 spray into the nose at bedtime as needed. For congestion   triamcinolone cream 0.1 % Commonly known as:  KENALOG Apply 1 application topically as needed.   vitamin E 400 UNIT capsule Take 400 Units by mouth daily.       Past Medical History:  Diagnosis Date  . Arthritis    "bone on bone'  left knee  . Asthma   . Breast cancer ([place])   . Colitis   . Dysphagia   . Esophageal stricture   . GERD  (gastroesophageal reflux disease)   . History of infertility   . Osteopenia   . Pneumonia   . Rosacea   . Ulcerative colitis [facility])     Past Surgical History:  Procedure Laterality Date  . BREAST LUMPECTOMY WITH RADIOACTIVE SEED AND SENTINEL LYMPH NODE BIOPSY Right 11/21/2016   Procedure: BREAST LUMPECTOMY WITH RADIOACTIVE SEED AND SENTINEL LYMPH NODE BIOPSY;  Surgeon: Excell Seltzer, MD;  Location: [facility];  Service: General;  Laterality: Right;  . BUNIONECTOMY WITH HAMMERTOE RECONSTRUCTION Left 07/2014  . CATARACT EXTRACTION Right 10/2015  . ESOPHAGEAL DILATION    . Repair of Chiari Malformation  02/05/13  . SUBOCCIPITAL CRANIECTOMY CERVICAL LAMINECTOMY  02/13/2012   Procedure: SUBOCCIPITAL CRANIECTOMY CERVICAL LAMINECTOMY/DURAPLASTY;  Surgeon: Ophelia Charter, MD;  Location: [facility];  Service: Neurosurgery;  Laterality: N/A;  Suboccipital craniectomy with upper cervical laminectomy and duraplasty  . TUBAL LIGATION  1987    Review of systems negative except as noted in HPI / PMHx or noted below:  Review of Systems  Constitutional: Negative.   HENT: Negative.   Eyes: Negative.   Respiratory: Negative.   Cardiovascular: Negative.   Gastrointestinal: Negative.   Genitourinary: Negative.   Musculoskeletal: Negative.   Skin: Negative.   Neurological: Negative.   Endo/Heme/Allergies: Negative.   Psychiatric/Behavioral: Negative.      Objective:   Vitals:   12/18/16 0808  BP: 128/72  Pulse: 80  Resp: 18          Physical Exam  Constitutional: She is well-developed, well-nourished, and in no distress.  Allergic shiners  HENT:  Head: Normocephalic.  Right Ear: Tympanic membrane, external ear and ear canal normal.  Left Ear: Tympanic membrane, external ear and ear canal normal.  Nose: Nose normal. No mucosal edema or rhinorrhea.  Mouth/Throat: Uvula is midline, oropharynx is clear and moist and mucous membranes are normal. No oropharyngeal  exudate.  Eyes: Conjunctivae are normal.  Neck: Trachea normal. No tracheal tenderness present. No tracheal deviation present. No thyromegaly present.  Cardiovascular: Normal rate, regular rhythm, S1 normal, S2 normal and normal heart sounds.   No murmur heard. Pulmonary/Chest: Breath sounds normal. No stridor. No respiratory distress. She has no wheezes. She has no rales.  Musculoskeletal: She exhibits no edema.  Lymphadenopathy:       Head (right side): No tonsillar adenopathy present.       Head (left side): No tonsillar adenopathy present.    She has no cervical adenopathy.  Neurological: She is alert. Gait normal.  Skin: No rash noted. She is not diaphoretic. No erythema. Nails show no clubbing.  Psychiatric: Mood and affect normal.    Diagnostics:    Spirometry was performed and demonstrated an FEV1 of 1.83 at 94 % of predicted.   Assessment and Plan:   1. Asthma, not well controlled, moderate persistent, with  acute exacerbation   2. Other allergic rhinitis   3. LPRD (laryngopharyngeal reflux disease)     1. Treat inflammation:   A. Continue Symbicort 160-2 inhalations inhalations one-2 times a day  B. Depo-Medrol 80 IM delivered in clinic today  C. Continue montelukast 10 mg daily  2. Continue treatment for reflux:   A. Continue omeprazole 40 mg twice a day  B. Add ranitidine 365m in evening while "sick"  3. Continue the following if needed:   A. antihistamine   B. Nasal saline  C. Mucinex DM  D. Xopenx HFA  4. Further treatment? Antibiotic?  5. Return to clinic in 1 year or earlier if problem   BZurieappears to have developed significant inflammation of her respiratory tract most likely secondary to a viral respiratory tract infection and I will be treating her with anti-inflammatory agents directed at her respiratory tract inflammation and because of her history of rather significant LPR in the past she will add a H2 receptor blocker to her proton pump  inhibitor while she is sick. I will hold off on any antibiotic administration at this point in time. She'll keep in contact with me noting her response. If she does well I will see her back in this clinic in 1 year.  EAllena Katz MD [facility]

## 2016-12-18 NOTE — Patient Instructions (Signed)
  1. Treat inflammation:   A. Continue Symbicort 160-2 inhalations inhalations one-2 times a day  B. Depo-Medrol 80 IM delivered in clinic today  C. Continue montelukast 10 mg daily  2. Continue treatment for reflux:   A. Continue omeprazole 40 mg twice a day  B. Add ranitidine 331m in evening while "sick"  3. Continue the following if needed:   A. antihistamine   B. Nasal saline  C. Mucinex DM  D. Xopenx HFA  4. Further treatment? Antibiotic?  5. Return to clinic in 1 year or earlier if problem

## 2016-12-21 ENCOUNTER — Ambulatory Visit
Admission: RE | Admit: 2016-12-21 | Discharge: 2016-12-21 | Disposition: A | Discharge status: Home / Self Care | Payer: Medicare Other | Source: Ambulatory Visit | Attending: Radiation Oncology | Admitting: Radiation Oncology

## 2016-12-21 ENCOUNTER — Ambulatory Visit: Payer: Medicare Other

## 2016-12-21 ENCOUNTER — Encounter: Payer: Self-pay | Admitting: Radiation Oncology

## 2016-12-21 ENCOUNTER — Inpatient Hospital Stay
Admission: RE | Admit: 2016-12-21 | Discharge: 2016-12-21 | Disposition: A | Discharge status: Home / Self Care | Payer: Self-pay | Source: Ambulatory Visit | Attending: Radiation Oncology | Admitting: Radiation Oncology

## 2016-12-21 DIAGNOSIS — Z170 Estrogen receptor positive status [ER+]: Principal | ICD-10-CM

## 2016-12-21 DIAGNOSIS — C50111 Malignant neoplasm of central portion of right female breast: Secondary | ICD-10-CM

## 2016-12-21 DIAGNOSIS — Z510 Encounter for antineoplastic radiation therapy: Secondary | ICD-10-CM | POA: Diagnosis not present

## 2016-12-21 DIAGNOSIS — C50811 Malignant neoplasm of overlapping sites of right female breast: Secondary | ICD-10-CM

## 2016-12-21 NOTE — Progress Notes (Signed)
Radiation Oncology         [phone]) [phone] ________________________________  Name: Cindy Arias MRN: [id number]  Date: 12/21/2016  DOB: 09/18/1946  Follow-Up Visit Note  Outpatient  CC: MCNEILL,WENDY, MD  Excell Seltzer, MD  Diagnosis:      ICD-9-CM ICD-10-CM   1. 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    She was initially seen in consultation in the multidisciplinary breast clinic on 11/13/16. At that time it was learned that she initially presented with an abnormal right breast mammogram at [facility] in Dec 2017.  A central right breast mass was appreciated and on [place] this was 69m.  She did not feel anything in her breast.  Biopsy on 10/17/16 showed invasive ductal carcinoma with characteristics as described above in the diagnosis.   Since consultation, she underwent right breast lumpectomy with radioactive seed and sentinel lymph node biopsy on 11/21/16 with Dr. HExcell Seltzer Surgical pathology revealed invasive ductal carcinoma, grade 2, spanning 1.9 cm. The carcinoma was within 0.1 cm of the medial margin. Final margins were clear by over 1 cm. No DCIS. The 2 sentinel lymph nodes sampled were negative. According to her Oncotype Dx score she will not need chemotherapy.  On review of systems, the patient denies any lymphedema issues. She denies pain. Overall the patient is without complaint.            ALLERGIES:  is allergic to sulfa antibiotics; albuterol; and penicillins.  Meds: Current Outpatient Prescriptions  Medication Sig Dispense Refill  . aspirin 81 MG tablet Take 81 mg by mouth daily.    . budesonide-formoterol (SYMBICORT)  160-4.5 MCG/ACT inhaler Inhale 2 puffs into the lungs 2 (two) times daily. 1 Inhaler 5  . CALCIUM CITRATE-VITAMIN D PO Take 500 mg by mouth daily.    . cetirizine (ZYRTEC) 10 MG tablet Take 10 mg by mouth as needed for allergies.    . cholecalciferol (VITAMIN D) 1000 UNITS tablet Take 1,000 Units by mouth daily.    . Diclofenac Sodium (PENNSAID TD) Place onto the skin 2 (two) times daily.    . hydrocortisone cream 1 % Apply 1 application topically 2 (two) times daily.    .Marland Kitchenlevalbuterol (XOPENEX HFA) 45 MCG/ACT inhaler Inhale two puffs every 4-6 hours if needed for cough or wheeze 1 Inhaler 1  . mesalamine (LIALDA) 1.2 G EC tablet Take 1.2 g by mouth daily with breakfast.     . montelukast (SINGULAIR) 10 MG tablet Take 1 tablet (10 mg total) by mouth at bedtime. 30 tablet 5  . Multiple Vitamins-Iron (MULTIVITAMINS WITH IRON) TABS Take 1 tablet by mouth daily.    . Nutritional Supplements (ESTROVEN PO) Take by mouth.    .Marland Kitchenomeprazole (PRILOSEC) 40 MG capsule Take one capsule twice daily as directed 60 capsule 5  . ranitidine (ZANTAC) 300 MG tablet Take one tablet once each evening during flare 30 tablet 5  . sodium chloride (OCEAN) 0.65 % nasal spray Place 1 spray into the nose at bedtime as needed. For congestion    . triamcinolone cream (KENALOG) 0.1 % Apply 1 application topically as needed.    . vitamin E 400 UNIT capsule  Take 400 Units by mouth daily.     No current facility-administered medications for this encounter.     Physical Findings:  height is 4' 10.75" (1.492 m) and weight is 122 lb (55.3 kg). Her temperature is 98.2 F (36.8 C). Her blood pressure is 134/65 and her pulse is 71. Her oxygen saturation is 98%. .     General: Alert and oriented, in no acute distress. Neck: Neck is supple, no palpable cervical or supraclavicular lymphadenopathy or masses. Heart: Regular in rate and rhythm with no murmurs. Chest: Clear to auscultation bilaterally. Abdomen: Soft, nontender,  nondistended. Extremities: No edema. Lymphatics: see Neck Exam Skin: Skin is well healed from surgery. Breast exam reveals the patient has healed well from surgery, She has a right sided lumpectomy scar lateral to the nipple and an axillary scar which has completely healed. Skin is well healed.  Lab Findings: Lab Results  Component Value Date   WBC 7.2 02/06/2012   HGB 13.4 02/06/2012   HCT 40.7 02/06/2012   MCV 86.0 02/06/2012   PLT 256 02/06/2012       Radiographic Findings: Nm Sentinel Node Inj-no Rpt (breast)  Result Date: 12/14/2016 There is no Radiologist interpretation  for this exam.   Impression/Plan: ready for radiotherapy We discussed adjuvant radiotherapy today.  I recommend 3 1/2 weeks of radiation therapy to the right breast in order to reduce risk of locoregional recurrence.  The risks, benefits and side effects of this treatment were discussed in detail.  She understands that radiotherapy is associated with skin irritation and fatigue in the acute setting. Late effects can include cosmetic changes and rare injury to internal organs.   She is enthusiastic about proceeding with treatment. A consent form has been signed and placed in her chart. The patient is scheduled for CT Simulation and treatment planning today.   Anticipate 3D conformal radiotherapy, with 2 tangential fields, vaclock for immobilization. Please see simulation note today.  _____________________________________   Eppie Gibson, MD  This document serves as a record of services personally performed by Eppie Gibson, MD. It was created on her behalf by Maryla Morrow, a trained medical scribe. The creation of this record is based on the scribe's personal observations and the provider's statements to them. This document has been checked and approved by the attending provider.

## 2016-12-21 NOTE — Progress Notes (Signed)
  Radiation Oncology         (336) [phone] ________________________________  Name: Cindy Arias MRN: [id number]  Date: 12/21/2016  DOB: [birth year]  SIMULATION AND TREATMENT PLANNING NOTE    Outpatient  DIAGNOSIS:   Malignant neoplasm of central portion of right breast in female, estrogen receptor positive ([facility]) 174.1 C50.111   V86.0 Z17.0     NARRATIVE:  The patient was brought to the [facility].  Identity was confirmed.  All relevant records and images related to the planned course of therapy were reviewed.  The patient freely provided informed written consent to proceed with treatment after reviewing the details related to the planned course of therapy. The consent form was witnessed and verified by the simulation staff.    Then, the patient was set-up in a stable reproducible supine position for radiation therapy with her ipsilateral arm over her head, and her upper body secured in a custom-made Vac-lok device.  CT images were obtained.  Surface markings were placed.  The CT images were loaded into the planning software.    TREATMENT PLANNING NOTE: Treatment planning then occurred.  The radiation prescription was entered and confirmed.     A total of 3 medically necessary complex treatment devices were fabricated and supervised by me: 2 fields with MLCs for custom blocks to protect heart, and lungs;  and, a Vac-lok. MORE COMPLEX DEVICES MAY BE MADE IN DOSIMETRY FOR FIELD IN FIELD BEAMS FOR DOSE HOMOGENEITY.  I have requested : 3D Simulation which is medically necessary to give adequate dose to at risk tissues while sparing lungs and heart.  I have requested a DVH of the following structures: lungs, heart, lumpectomy cavity.    The patient will receive 42.56 Gy in 16 fractions to the right breast with 2 tangential fields.  This will not be followed by a boost.  Optical Surface Tracking Plan:  Since intensity modulated radiotherapy (IMRT) and 3D conformal radiation  treatment methods are predicated on accurate and precise positioning for treatment, intrafraction motion monitoring is medically necessary to ensure accurate and safe treatment delivery. The ability to quantify intrafraction motion without excessive ionizing radiation dose can only be performed with optical surface tracking. Accordingly, surface imaging offers the opportunity to obtain 3D measurements of patient position throughout IMRT and 3D treatments without excessive radiation exposure. I am ordering optical surface tracking for this patient's upcoming course of radiotherapy.  ________________________________   Reference:  Ursula Alert, J, et al. Surface imaging-based analysis of intrafraction motion for breast radiotherapy patients.Journal of [employer], n. 6, nov. 2014. ISSN [id number].  Available at: <http://www.jacmp.org/index.php/jacmp/article/view/4957>.    -----------------------------------  Eppie Gibson, MD

## 2016-12-24 DIAGNOSIS — Z510 Encounter for antineoplastic radiation therapy: Secondary | ICD-10-CM | POA: Diagnosis not present

## 2016-12-28 ENCOUNTER — Ambulatory Visit
Admission: RE | Admit: 2016-12-28 | Discharge: 2016-12-28 | Disposition: A | Discharge status: Home / Self Care | Payer: Medicare Other | Source: Ambulatory Visit | Attending: Radiation Oncology | Admitting: Radiation Oncology

## 2016-12-28 DIAGNOSIS — Z170 Estrogen receptor positive status [ER+]: Secondary | ICD-10-CM | POA: Insufficient documentation

## 2016-12-28 DIAGNOSIS — C50811 Malignant neoplasm of overlapping sites of right female breast: Secondary | ICD-10-CM

## 2016-12-28 DIAGNOSIS — Z510 Encounter for antineoplastic radiation therapy: Secondary | ICD-10-CM | POA: Diagnosis not present

## 2016-12-28 MED ORDER — ALRA NON-METALLIC DEODORANT (RAD-ONC)
1.0000 "application " | Freq: Once | TOPICAL | Status: AC
  Administered 2016-12-28: 1 via TOPICAL

## 2016-12-28 MED ORDER — RADIAPLEXRX EX GEL
Freq: Once | CUTANEOUS | Status: AC
  Administered 2016-12-28: 17:00:00 via TOPICAL

## 2016-12-28 NOTE — Progress Notes (Signed)
Pt here for patient teaching.  Pt given Radiation and You booklet, skin care instructions, Alra deodorant and Radiaplex gel.  Reviewed areas of pertinence such as fatigue, skin changes, breast tenderness and breast swelling . Pt able to give teach back of to pat skin, use unscented/gentle soap and drink plenty of water,apply Radiaplex bid, avoid applying anything to skin within 4 hours of treatment, avoid wearing an under wire bra and to use an electric razor if they must shave. Pt verbalizes understanding of information given and will contact nursing with any questions or concerns.     Http://rtanswers.org/treatmentinformation/whattoexpect/index

## 2016-12-31 ENCOUNTER — Ambulatory Visit
Admission: RE | Admit: 2016-12-31 | Discharge: 2016-12-31 | Disposition: A | Discharge status: Home / Self Care | Payer: Medicare Other | Source: Ambulatory Visit | Attending: Radiation Oncology | Admitting: Radiation Oncology

## 2016-12-31 ENCOUNTER — Encounter: Payer: Self-pay | Admitting: Radiation Oncology

## 2016-12-31 VITALS — BP 137/72 | HR 65 | Temp 98.1°F | Wt 121.8 lb

## 2016-12-31 DIAGNOSIS — Z510 Encounter for antineoplastic radiation therapy: Secondary | ICD-10-CM | POA: Diagnosis not present

## 2016-12-31 DIAGNOSIS — C50111 Malignant neoplasm of central portion of right female breast: Secondary | ICD-10-CM

## 2016-12-31 DIAGNOSIS — Z170 Estrogen receptor positive status [ER+]: Principal | ICD-10-CM

## 2016-12-31 NOTE — Progress Notes (Signed)
Cindy Arias is here for her 1st fraction of radiation to her Right Breast. She denies pain or fatigue. She denies any skin changes. She is using Radiaplex twice daily as directed.   BP 137/72   Pulse 65   Temp 98.1 F (36.7 C)   Wt 121 lb 12.8 oz (55.2 kg)   LMP 02/13/2012   SpO2 100% Comment: room air  BMI 24.81 kg/m    Wt Readings from Last 3 Encounters:  12/31/16 121 lb 12.8 oz (55.2 kg)  12/21/16 122 lb (55.3 kg)  11/21/16 120 lb 9.6 oz (54.7 kg)

## 2017-01-01 ENCOUNTER — Ambulatory Visit
Admission: RE | Admit: 2017-01-01 | Discharge: 2017-01-01 | Disposition: A | Discharge status: Home / Self Care | Payer: Medicare Other | Source: Ambulatory Visit | Attending: Radiation Oncology | Admitting: Radiation Oncology

## 2017-01-01 DIAGNOSIS — Z510 Encounter for antineoplastic radiation therapy: Secondary | ICD-10-CM | POA: Diagnosis not present

## 2017-01-01 NOTE — Progress Notes (Signed)
   Weekly Management Note:  outpatient    ICD-9-CM ICD-10-CM   1. Carcinoma of central portion of right breast in female, estrogen receptor positive ([place]) 174.1 C50.111    V86.0 Z17.0     Current Dose:  2.66 Gy  Projected Dose: 42.56 Gy   Narrative:  The patient presents for routine under treatment assessment.  CBCT/MVCT images/Port film x-rays were reviewed.  The chart was checked. Doing well  Physical Findings:  weight is 121 lb 12.8 oz (55.2 kg). Her temperature is 98.1 F (36.7 C). Her blood pressure is 137/72 and her pulse is 65. Her oxygen saturation is 100%.   Wt Readings from Last 3 Encounters:  12/31/16 121 lb 12.8 oz (55.2 kg)  12/21/16 122 lb (55.3 kg)  11/21/16 120 lb 9.6 oz (54.7 kg)   NAD  Impression:  The patient is tolerating radiotherapy.  Plan:  Continue radiotherapy as planned. Patient instructed to apply Radiplex to intact skin in treatment fields.    ________________________________   Eppie Gibson, M.D.

## 2017-01-02 ENCOUNTER — Ambulatory Visit
Admission: RE | Admit: 2017-01-02 | Discharge: 2017-01-02 | Disposition: A | Discharge status: Home / Self Care | Payer: Medicare Other | Source: Ambulatory Visit | Attending: Radiation Oncology | Admitting: Radiation Oncology

## 2017-01-02 DIAGNOSIS — Z510 Encounter for antineoplastic radiation therapy: Secondary | ICD-10-CM | POA: Diagnosis not present

## 2017-01-03 ENCOUNTER — Ambulatory Visit
Admission: RE | Admit: 2017-01-03 | Discharge: 2017-01-03 | Disposition: A | Discharge status: Home / Self Care | Payer: Medicare Other | Source: Ambulatory Visit | Attending: Radiation Oncology | Admitting: Radiation Oncology

## 2017-01-03 ENCOUNTER — Ambulatory Visit: Payer: Medicare Other | Admitting: Radiation Oncology

## 2017-01-03 DIAGNOSIS — Z510 Encounter for antineoplastic radiation therapy: Secondary | ICD-10-CM | POA: Diagnosis not present

## 2017-01-04 ENCOUNTER — Ambulatory Visit
Admission: RE | Admit: 2017-01-04 | Discharge: 2017-01-04 | Disposition: A | Discharge status: Home / Self Care | Payer: Medicare Other | Source: Ambulatory Visit | Attending: Radiation Oncology | Admitting: Radiation Oncology

## 2017-01-04 DIAGNOSIS — Z510 Encounter for antineoplastic radiation therapy: Secondary | ICD-10-CM | POA: Diagnosis not present

## 2017-01-07 ENCOUNTER — Ambulatory Visit
Admission: RE | Admit: 2017-01-07 | Discharge: 2017-01-07 | Disposition: A | Discharge status: Home / Self Care | Payer: Medicare Other | Source: Ambulatory Visit | Attending: Radiation Oncology | Admitting: Radiation Oncology

## 2017-01-07 ENCOUNTER — Encounter: Payer: Self-pay | Admitting: Radiation Oncology

## 2017-01-07 VITALS — BP 130/59 | HR 72 | Temp 98.3°F | Wt 124.8 lb

## 2017-01-07 DIAGNOSIS — C50111 Malignant neoplasm of central portion of right female breast: Secondary | ICD-10-CM

## 2017-01-07 DIAGNOSIS — Z170 Estrogen receptor positive status [ER+]: Principal | ICD-10-CM

## 2017-01-07 DIAGNOSIS — Z510 Encounter for antineoplastic radiation therapy: Secondary | ICD-10-CM | POA: Diagnosis not present

## 2017-01-07 NOTE — Progress Notes (Signed)
Cindy Arias presents for her 6th fraction of radiation to her Right Breast. She denies pain. She does have some mild fatigue after a busy previous week. She has no skin changes at this time and is using Radiaplex twice daily as directed.   BP (!) 130/59   Pulse 72   Temp 98.3 F (36.8 C)   Wt 124 lb 12.8 oz (56.6 kg)   LMP 02/13/2012   SpO2 100% Comment: room air  BMI 25.42 kg/m    Wt Readings from Last 3 Encounters:  01/07/17 124 lb 12.8 oz (56.6 kg)  12/31/16 121 lb 12.8 oz (55.2 kg)  12/21/16 122 lb (55.3 kg)

## 2017-01-07 NOTE — Progress Notes (Signed)
   Weekly Management Note:  outpatient    ICD-9-CM ICD-10-CM   1. Carcinoma of central portion of right breast in female, estrogen receptor positive ([place]) 174.1 C50.111    V86.0 Z17.0     Current Dose:  16.02 Gy  Projected Dose: 42.56 Gy   Narrative:  The patient presents for routine under treatment assessment.  CBCT/MVCT images/Port film x-rays were reviewed.  The chart was checked. Doing well. Some fatigue, mild   Physical Findings:  weight is 124 lb 12.8 oz (56.6 kg). Her temperature is 98.3 F (36.8 C). Her blood pressure is 130/59 (abnormal) and her pulse is 72. Her oxygen saturation is 100%.   Wt Readings from Last 3 Encounters:  01/07/17 124 lb 12.8 oz (56.6 kg)  12/31/16 121 lb 12.8 oz (55.2 kg)  12/21/16 122 lb (55.3 kg)   NAD Erythema, right breast.  Impression:  The patient is tolerating radiotherapy.  Plan:  Continue radiotherapy as planned. Patient instructed to apply Radiplex to intact skin in treatment fields.    ________________________________   Eppie Gibson, M.D.

## 2017-01-08 ENCOUNTER — Ambulatory Visit
Admission: RE | Admit: 2017-01-08 | Discharge: 2017-01-08 | Disposition: A | Discharge status: Home / Self Care | Payer: Medicare Other | Source: Ambulatory Visit | Attending: Radiation Oncology | Admitting: Radiation Oncology

## 2017-01-08 ENCOUNTER — Ambulatory Visit (HOSPITAL_BASED_OUTPATIENT_CLINIC_OR_DEPARTMENT_OTHER): Payer: Medicare Other | Admitting: Oncology

## 2017-01-08 VITALS — BP 126/64 | HR 75 | Temp 98.5°F | Resp 18 | Ht 58.75 in | Wt 122.8 lb

## 2017-01-08 DIAGNOSIS — C50811 Malignant neoplasm of overlapping sites of right female breast: Secondary | ICD-10-CM | POA: Diagnosis not present

## 2017-01-08 DIAGNOSIS — C50111 Malignant neoplasm of central portion of right female breast: Secondary | ICD-10-CM

## 2017-01-08 DIAGNOSIS — Z510 Encounter for antineoplastic radiation therapy: Secondary | ICD-10-CM | POA: Diagnosis not present

## 2017-01-08 DIAGNOSIS — Z170 Estrogen receptor positive status [ER+]: Secondary | ICD-10-CM | POA: Diagnosis not present

## 2017-01-08 NOTE — Progress Notes (Signed)
[place]  Telephone:([phone]) [phone] Fax:(336) [phone]     ID: CHARLENE DETTER DOB: 1945-11-10  MR#: [id number]  FHL#:[id number]  Patient Care Team: Cari Caraway, MD as PCP - General (Family Medicine) Chauncey Cruel, MD as Consulting Physician (Oncology) Excell Seltzer, MD as Consulting Physician (General Surgery) Eppie Gibson, MD as Attending Physician (Radiation Oncology) Luberta Mutter, MD as Consulting Physician (Ophthalmology) Jiles Prows, MD as Consulting Physician (Allergy and Immunology) Arta Silence, MD as Consulting Physician (Gastroenterology) Chauncey Cruel, MD OTHER MD:  CHIEF COMPLAINT: Estrogen receptor positive breast cancer  CURRENT TREATMENT: Radiation therapy   BREAST CANCER HISTORY: From the original intake note:  Tachina had bilateral screening mammography with tomography at [facility] 10/04/2016 showing a possible new mass in the right breast central to the nipple. The breast density was category B. On 10/11/2016 she underwent ultrasonography of the right breast and axilla. This confirmed a 0.9 cm irregular mass in the central breast correlating with the mammography findings. The right axilla was sonographically benign.   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  The patient's subsequent history is as detailed below  INTERVAL HISTORY: Ciaira returns today for follow-up of her estrogen receptor positive breast cancer. Since her last visit here she underwent right lumpectomy with sentinel lymph node sampling, on November 21, 2016. The final pathology ([id number]) showed an invasive ductal carcinoma measuring 1.9 cm. This was grade 2. Margins  were negative though close. Both sentinel lymph nodes were clear.  We then obtained an Oncotype DX study, which showed a score of 16, predicting a risk of recurrence outside the breast in the next 10 years of 10% the patient's only systemic therapy was tamoxifen for 5 years. It also predicted benefit from chemotherapy  Accordingly the patient proceeded to radiation, which is still ongoing and scheduled to be completed 01/21/2017.  REVIEW OF SYSTEMS: Noheli is tolerating her radiation treatments well. She denies any significant fatigue, erythema or desquamation. She is not exercising regularly but is very active. A detailed review of systems today was otherwise stable   PAST MEDICAL HISTORY: Past Medical History:  Diagnosis Date  . Arthritis    "bone on bone'  left knee  . Asthma   . Breast cancer ([place])   . Colitis   . Dysphagia   . Esophageal stricture   . GERD (gastroesophageal reflux disease)   . History of infertility   . Osteopenia   . Pneumonia   . Rosacea   . Ulcerative colitis ([place])     PAST SURGICAL HISTORY: Past Surgical History:  Procedure Laterality Date  . BREAST LUMPECTOMY WITH RADIOACTIVE SEED AND SENTINEL LYMPH NODE BIOPSY Right 11/21/2016   Procedure: BREAST LUMPECTOMY WITH RADIOACTIVE SEED AND SENTINEL LYMPH NODE BIOPSY;  Surgeon: Excell Seltzer, MD;  Location: [facility];  Service: General;  Laterality: Right;  . BUNIONECTOMY WITH HAMMERTOE RECONSTRUCTION Left 07/2014  . CATARACT EXTRACTION Right 10/2015  . ESOPHAGEAL DILATION    . Repair of Chiari Malformation  02/05/13  . SUBOCCIPITAL CRANIECTOMY CERVICAL LAMINECTOMY  02/13/2012   Procedure: SUBOCCIPITAL CRANIECTOMY CERVICAL LAMINECTOMY/DURAPLASTY;  Surgeon: Ophelia Charter, MD;  Location: [facility];  Service: Neurosurgery;  Laterality: N/A;  Suboccipital craniectomy with upper cervical  laminectomy and duraplasty  . TUBAL LIGATION  1987    FAMILY HISTORY Family History  Problem  Relation Age of Onset  . Cancer Mother     Liver- 2003 Lymphoma  . Rheum arthritis Mother   . Stroke Father   . Diabetes Maternal Grandfather     Type 2   . Heart disease Maternal Grandfather   . Hypertension Maternal Grandmother   . Heart disease Maternal Grandmother   . Osteoporosis Maternal Grandmother   . Anesthesia problems Neg Hx   The patient's father died at the age of 67 FOLLOWING his stroke. The patient's mother died of non-Hodgkin's lymphoma at the age of 39. The patient had no brothers, 1 sister. There is no history of breast or ovarian cancer in the family  GYNECOLOGIC HISTORY:  Patient's last menstrual period was 02/13/2012. Menarche age 71, first live birth age 71. The patient is GX P1. She went through the change of life in her 71. She did not take hormone replacement.  SOCIAL HISTORY:  Harlym work for [employer], for the [employer] agency in for [employer]. She is now retired and does a lot of volunteering. Her husband Tyrone Nine is a retired Optometrist. They have been married 46 years as of January 2018. Their daughter Elmo Putt teaches special education in [place]. The patient has one grandchild. She attends [employer]: Not in place   HEALTH MAINTENANCE: Social History  Substance Use Topics  . Smoking status: Never Smoker  . Smokeless tobacco: Never Used     Comment: she smoked for a few months in college  . Alcohol use 0.6 oz/week    1 Glasses of wine per week     Comment: 1 glass of wine socially once or twice a month     Colonoscopy: Due 2019  PAP: Up-to-date  Bone density: mild osteopenia   Allergies  Allergen Reactions  . Sulfa Antibiotics Hives  . Albuterol Other (See Comments)    States she cannot tolerate albuterol without side effects  . Penicillins Hives and Other (See Comments)    Had reaction as a child and as an adult. Had additional reaction but not clear on the specifics     Current Outpatient Prescriptions  Medication Sig Dispense Refill  . aspirin 81 MG tablet Take 81 mg by mouth daily.    . budesonide-formoterol (SYMBICORT) 160-4.5 MCG/ACT inhaler Inhale 2 puffs into the lungs 2 (two) times daily. 1 Inhaler 5  . CALCIUM CITRATE-VITAMIN D PO Take 500 mg by mouth daily.    . cetirizine (ZYRTEC) 10 MG tablet Take 10 mg by mouth as needed for allergies.    . cholecalciferol (VITAMIN D) 1000 UNITS tablet Take 1,000 Units by mouth daily.    . Diclofenac Sodium (PENNSAID TD) Place onto the skin 2 (two) times daily.    . hydrocortisone cream 1 % Apply 1 application topically 2 (two) times daily.    Marland Kitchen levalbuterol (XOPENEX HFA) 45 MCG/ACT inhaler Inhale two puffs every 4-6 hours if needed for cough or wheeze 1 Inhaler 1  . mesalamine (LIALDA) 1.2 G EC tablet Take 1.2 g by mouth daily with breakfast.     . montelukast (SINGULAIR) 10 MG tablet Take 1 tablet (10 mg total) by mouth at bedtime. 30 tablet 5  . Multiple Vitamins-Iron (MULTIVITAMINS WITH IRON) TABS Take 1 tablet by mouth daily.    . Nutritional Supplements (ESTROVEN PO) Take by mouth.    Marland Kitchen  omeprazole (PRILOSEC) 40 MG capsule Take one capsule twice daily as directed 60 capsule 5  . ranitidine (ZANTAC) 300 MG tablet Take one tablet once each evening during flare 30 tablet 5  . sodium chloride (OCEAN) 0.65 % nasal spray Place 1 spray into the nose at bedtime as needed. For congestion    . triamcinolone cream (KENALOG) 0.1 % Apply 1 application topically as needed.    . vitamin E 400 UNIT capsule Take 400 Units by mouth daily.     No current facility-administered medications for this visit.     OBJECTIVE: Middle-aged white woman In no acute distress  Vitals:   01/08/17 1459  BP: 126/64  Pulse: 75  Resp: 18  Temp: 98.5 F (36.9 C)     Body mass index is 25.01 kg/m.    ECOG FS:0 - Asymptomatic  Sclerae unicteric, EOMs intact Oropharynx clear and moist No cervical or supraclavicular  adenopathy Lungs no rales or rhonchi Heart regular rate and rhythm Abd soft, nontender, positive bowel sounds MSK no focal spinal tenderness, no upper extremity lymphedema Neuro: nonfocal, well oriented, appropriate affect Breasts: The right breast is status post lumpectomy and is currently undergoing radiation. There is a minimal blush. There is no desquamation. The cosmetic result is excellent. The left breast is unremarkable. Both axillae are benign.  LAB RESULTS:  CMP     Component Value Date/Time   NA 137 02/06/2012 1042   K 4.1 02/06/2012 1042   CL 102 02/06/2012 1042   CO2 23 02/06/2012 1042   GLUCOSE 91 02/06/2012 1042   BUN 19 02/06/2012 1042   CREATININE 0.66 02/06/2012 1042   CALCIUM 9.4 02/06/2012 1042   GFRNONAA >90 02/06/2012 1042   GFRAA >90 02/06/2012 1042    INo results found for: SPEP, UPEP  Lab Results  Component Value Date   WBC 7.2 02/06/2012   HGB 13.4 02/06/2012   HCT 40.7 02/06/2012   MCV 86.0 02/06/2012   PLT 256 02/06/2012      Chemistry      Component Value Date/Time   NA 137 02/06/2012 1042   K 4.1 02/06/2012 1042   CL 102 02/06/2012 1042   CO2 23 02/06/2012 1042   BUN 19 02/06/2012 1042   CREATININE 0.66 02/06/2012 1042      Component Value Date/Time   CALCIUM 9.4 02/06/2012 1042       No results found for: LABCA2  No components found for: LABCA125  No results for input(s): INR in the last 168 hours.  Urinalysis No results found for: COLORURINE, APPEARANCEUR, LABSPEC, PHURINE, GLUCOSEU, HGBUR, BILIRUBINUR, KETONESUR, PROTEINUR, UROBILINOGEN, NITRITE, LEUKOCYTESUR   STUDIES: Outside studies reviewed  ELIGIBLE FOR AVAILABLE RESEARCH PROTOCOL: no  ASSESSMENT: 71 y.o. [place] woman status post right breast overlapping sites biopsy 10/17/2016 for a clinical T1b N0, stage IA invasive ductal carcinoma, grade 2, estrogen and progesterone receptor positive, HER-2 nonamplified, with an MIB-1 of 10%  (1)  s/p right lumpectomy  and sentinel lymph node sampling 11/21/2016 for aT1c pN0, stage IA invasive ductal carcinoma, grade 2, with close but negative margins  (2) Oncotype DX score of 16 predict a 10 year risk of recurrence outside the breast of 10% if the patient's only systemic therapy is tamoxifen for 5 years. It also predicts no benefit from adjuvant chemotherapy.with will be  (3) adjuvant radiation in process  (4) anti-estrogens to follow at the completion of local treatment  PLAN: I spent approximately 30 minutes with Rise Paganini with most of that time  spent discussing her current situation. Once she completes her radiation and recovers from it she will be ready to start anti-estrogens. We discussed the difference between tamoxifen and anastrozole in detail. She understands that anastrozole and the aromatase inhibitors in general work by blocking estrogen production. Accordingly vaginal dryness, decrease in bone density, and of course hot flashes can result. The aromatase inhibitors can also negatively affect the cholesterol profile, although that is a minor effect. One out of 5 women on aromatase inhibitors we will feel "old and achy". This arthralgia/myalgia syndrome, which resembles fibromyalgia clinically, does resolve with stopping the medications. Accordingly this is not a reason to not try an aromatase inhibitor but it is a frequent reason to stop it (in other words 20% of women will not be able to tolerate these medications).  Tamoxifen on the other hand does not block estrogen production. It does not "take away a woman's estrogen". It blocks the estrogen receptor in breast cells. Like anastrozole, it can also cause hot flashes. As opposed to anastrozole, tamoxifen has many estrogen-like effects. It is technically an estrogen receptor modulator. This means that in some tissues tamoxifen works like estrogen-- for example it helps strengthen the bones. It tends to improve the cholesterol profile. It can cause thickening  of the endometrial lining, and even endometrial polyps or rarely cancer of the uterus.(The risk of uterine cancer due to tamoxifen is one additional cancer per thousand women year). It can cause vaginal wetness or stickiness. It can cause blood clots through this estrogen-like effect--the risk of blood clots with tamoxifen is exactly the same as with birth control pills or hormone replacement.  Neither of these agents causes mood changes or weight gain, despite the popular belief that they can have these side effects. We have data from studies comparing either of these drugs with placebo, and in those cases the control group had the same amount of weight gain and depression as the group that took the drug.  After this discussion she could not quite decide which of these drugs she might want and she wants to do further reading. She is going to call us as soon as she is a little bit clearer on this, and I will place the prescription into your pharmacy at that time.  If she opts for anastrozole it will be important to have a repeat bone density late this year or early next year, and this is usually done through Dr. Thamas Jaegers office  I expect that she will be ready to start anti-estrogens sometime in the middle of April. Accordingly I will see her in the latter part of June by which time if she is having side effects that should be manifest.  The overall plan of course is to continue anti-estrogens for a minimum of 5 years  She knows to call for any other problems that may develop before the next visit   .  Chauncey Cruel, MD   01/08/2017 3:18 PM Medical Oncology and Hematology [facility] [address] [place], [place] [address] Tel. [phone]    Fax. [phone]

## 2017-01-09 ENCOUNTER — Ambulatory Visit
Admission: RE | Admit: 2017-01-09 | Discharge: 2017-01-09 | Disposition: A | Discharge status: Home / Self Care | Payer: Medicare Other | Source: Ambulatory Visit | Attending: Radiation Oncology | Admitting: Radiation Oncology

## 2017-01-09 DIAGNOSIS — Z510 Encounter for antineoplastic radiation therapy: Secondary | ICD-10-CM | POA: Diagnosis not present

## 2017-01-10 ENCOUNTER — Ambulatory Visit
Admission: RE | Admit: 2017-01-10 | Discharge: 2017-01-10 | Disposition: A | Discharge status: Home / Self Care | Payer: Medicare Other | Source: Ambulatory Visit | Attending: Radiation Oncology | Admitting: Radiation Oncology

## 2017-01-10 DIAGNOSIS — Z510 Encounter for antineoplastic radiation therapy: Secondary | ICD-10-CM | POA: Diagnosis not present

## 2017-01-11 ENCOUNTER — Ambulatory Visit
Admission: RE | Admit: 2017-01-11 | Discharge: 2017-01-11 | Disposition: A | Discharge status: Home / Self Care | Payer: Medicare Other | Source: Ambulatory Visit | Attending: Radiation Oncology | Admitting: Radiation Oncology

## 2017-01-11 DIAGNOSIS — Z510 Encounter for antineoplastic radiation therapy: Secondary | ICD-10-CM | POA: Diagnosis not present

## 2017-01-14 ENCOUNTER — Ambulatory Visit
Admission: RE | Admit: 2017-01-14 | Discharge: 2017-01-14 | Disposition: A | Discharge status: Home / Self Care | Payer: Medicare Other | Source: Ambulatory Visit | Attending: Radiation Oncology | Admitting: Radiation Oncology

## 2017-01-14 ENCOUNTER — Encounter: Payer: Self-pay | Admitting: Radiation Oncology

## 2017-01-14 VITALS — BP 98/53 | HR 78 | Temp 97.6°F | Ht 58.75 in | Wt 123.2 lb

## 2017-01-14 DIAGNOSIS — Z510 Encounter for antineoplastic radiation therapy: Secondary | ICD-10-CM | POA: Diagnosis not present

## 2017-01-14 DIAGNOSIS — Z170 Estrogen receptor positive status [ER+]: Principal | ICD-10-CM

## 2017-01-14 DIAGNOSIS — C50111 Malignant neoplasm of central portion of right female breast: Secondary | ICD-10-CM

## 2017-01-14 NOTE — Progress Notes (Signed)
Ms. Kindel is here for her 11th fraction of radiation to her Right Breast. She denies pain or fatigue. Her Right Breast has slight redness. She is using Radiaplex twice daily as directed.  BP (!) 98/53   Pulse 78   Temp 97.6 F (36.4 C)   Ht 4' 10.75" (1.492 m)   Wt 123 lb 3.2 oz (55.9 kg)   LMP 02/13/2012   SpO2 99% Comment: room air  BMI 25.10 kg/m    Wt Readings from Last 3 Encounters:  01/14/17 123 lb 3.2 oz (55.9 kg)  01/08/17 122 lb 12.8 oz (55.7 kg)  01/07/17 124 lb 12.8 oz (56.6 kg)

## 2017-01-14 NOTE — Progress Notes (Addendum)
   Weekly Management Note:  outpatient    ICD-9-CM ICD-10-CM   1. Carcinoma of central portion of right breast in female, estrogen receptor positive ([place]) 174.1 C50.111    V86.0 Z17.0     Current Dose:  29.4 Gy  Projected Dose: 42.56 Gy   Narrative:  The patient presents for routine under treatment assessment.  CBCT/MVCT images/Port film x-rays were reviewed.  The chart was checked. Doing well.   Physical Findings:  height is 4' 10.75" (1.492 m) and weight is 123 lb 3.2 oz (55.9 kg). Her temperature is 97.6 F (36.4 C). Her blood pressure is 98/53 (abnormal) and her pulse is 78. Her oxygen saturation is 99%.   Wt Readings from Last 3 Encounters:  01/14/17 123 lb 3.2 oz (55.9 kg)  01/08/17 122 lb 12.8 oz (55.7 kg)  01/07/17 124 lb 12.8 oz (56.6 kg)   NAD Erythema, right breast. Skin intact.  Impression:  The patient is tolerating radiotherapy.  Plan:  Continue radiotherapy as planned. Patient instructed to apply Radiplex to intact skin in treatment fields. Apply hydrocortisone 1% cream prn itching. Patient expresses she no longer wants calls to or messages left on her home phone.  I removed this # from her demographics and she expressed thanks. f/u in 19moafter RT, sooner PRN.    ________________________________   SEppie Gibson M.D.

## 2017-01-15 ENCOUNTER — Ambulatory Visit
Admission: RE | Admit: 2017-01-15 | Discharge: 2017-01-15 | Disposition: A | Discharge status: Home / Self Care | Payer: Medicare Other | Source: Ambulatory Visit | Attending: Radiation Oncology | Admitting: Radiation Oncology

## 2017-01-15 DIAGNOSIS — Z510 Encounter for antineoplastic radiation therapy: Secondary | ICD-10-CM | POA: Diagnosis not present

## 2017-01-16 ENCOUNTER — Ambulatory Visit
Admission: RE | Admit: 2017-01-16 | Discharge: 2017-01-16 | Disposition: A | Discharge status: Home / Self Care | Payer: Medicare Other | Source: Ambulatory Visit | Attending: Radiation Oncology | Admitting: Radiation Oncology

## 2017-01-16 DIAGNOSIS — Z510 Encounter for antineoplastic radiation therapy: Secondary | ICD-10-CM | POA: Diagnosis not present

## 2017-01-17 ENCOUNTER — Ambulatory Visit
Admission: RE | Admit: 2017-01-17 | Discharge: 2017-01-17 | Disposition: A | Discharge status: Home / Self Care | Payer: Medicare Other | Source: Ambulatory Visit | Attending: Radiation Oncology | Admitting: Radiation Oncology

## 2017-01-17 DIAGNOSIS — Z510 Encounter for antineoplastic radiation therapy: Secondary | ICD-10-CM | POA: Diagnosis not present

## 2017-01-18 ENCOUNTER — Ambulatory Visit
Admission: RE | Admit: 2017-01-18 | Discharge: 2017-01-18 | Disposition: A | Discharge status: Home / Self Care | Payer: Medicare Other | Source: Ambulatory Visit | Attending: Radiation Oncology | Admitting: Radiation Oncology

## 2017-01-18 DIAGNOSIS — Z510 Encounter for antineoplastic radiation therapy: Secondary | ICD-10-CM | POA: Diagnosis not present

## 2017-01-21 ENCOUNTER — Ambulatory Visit
Admission: RE | Admit: 2017-01-21 | Discharge: 2017-01-21 | Disposition: A | Discharge status: Home / Self Care | Payer: Medicare Other | Source: Ambulatory Visit | Attending: Radiation Oncology | Admitting: Radiation Oncology

## 2017-01-21 DIAGNOSIS — Z510 Encounter for antineoplastic radiation therapy: Secondary | ICD-10-CM | POA: Diagnosis not present

## 2017-01-22 ENCOUNTER — Telehealth: Payer: Self-pay | Admitting: *Deleted

## 2017-01-22 NOTE — Telephone Encounter (Signed)
Left vm congratulating on completion of xrt. Contact information provided for questions.

## 2017-01-23 ENCOUNTER — Telehealth: Payer: Self-pay | Admitting: Oncology

## 2017-01-23 ENCOUNTER — Encounter: Payer: Self-pay | Admitting: Radiation Oncology

## 2017-01-23 NOTE — Telephone Encounter (Signed)
sw pt to inform of SCP appt in May per LOS

## 2017-01-23 NOTE — Progress Notes (Signed)
  Radiation Oncology         [phone] ________________________________  Name: Cindy Arias MRN: [id number]  Date: 01/23/2017  DOB: [birth year]  End of Treatment Note  Diagnosis:   Stage I  T1cN0M0 Central Right Breast Invasive Ductal Carcinoma, ER/PR Positive, Her2 Negative, Grade 2, status post right lumpectomy   Indication for treatment:  Curative       Radiation treatment dates:   12/31/16 - 01/21/17  Site/dose:   Right breast treated to 42.56 Gy in 16 fractions  Beams/energy:   3D  //  6X  Narrative: The patient tolerated radiation treatment relatively well. The patient developed radiation related skin changes including erythema to the right breast. Overall, she was without complaint.  Plan: The patient has completed radiation treatment. The patient was instructed to apply Radiaplex to intact skin within the treatment fields, and to use hydrocortisone 1% cream as needed for itching. The patient will return to radiation oncology clinic for routine followup in one month. I advised them to call or return sooner if they have any questions or concerns related to their recovery or treatment.  -----------------------------------  Eppie Gibson, MD  This document serves as a record of services personally performed by Eppie Gibson, MD. It was created on her behalf by Maryla Morrow, a trained medical scribe. The creation of this record is based on the scribe's personal observations and the provider's statements to them. This document has been checked and approved by the attending provider.

## 2017-01-24 ENCOUNTER — Telehealth: Payer: Self-pay | Admitting: *Deleted

## 2017-01-24 MED ORDER — ANASTROZOLE 1 MG PO TABS: 1.0000 mg | ORAL_TABLET | Freq: Every day | ORAL | 3 refills | Status: DC

## 2017-01-24 NOTE — Telephone Encounter (Signed)
  Oncology Nurse Navigator Documentation  Navigator Location: [facility] (01/24/17 1000)   )Navigator Encounter Type: Telephone (01/24/17 1000) Telephone: [facility] Call;Appt Confirmation/Clarification (01/24/17 1000)  Scheduled and confirmed new SCP appt with Mendel Ryder for 5/11 at 1:30. Discussed program. Denies further needs at this time.                                                Time Spent with Patient: 30 (01/24/17 1000)

## 2017-02-11 ENCOUNTER — Other Ambulatory Visit: Payer: Self-pay | Admitting: *Deleted

## 2017-02-11 MED ORDER — MONTELUKAST SODIUM 10 MG PO TABS: ORAL_TABLET | ORAL | 10 refills | Status: DC

## 2017-02-21 ENCOUNTER — Encounter: Payer: Self-pay | Admitting: Radiation Oncology

## 2017-02-26 ENCOUNTER — Encounter: Payer: Self-pay | Admitting: Radiation Oncology

## 2017-02-26 ENCOUNTER — Ambulatory Visit
Admission: RE | Admit: 2017-02-26 | Discharge: 2017-02-26 | Disposition: A | Discharge status: Home / Self Care | Payer: Medicare Other | Source: Ambulatory Visit | Attending: Radiation Oncology | Admitting: Radiation Oncology

## 2017-02-26 VITALS — BP 125/57 | HR 66 | Temp 98.4°F | Ht 58.75 in | Wt 121.8 lb

## 2017-02-26 DIAGNOSIS — Z08 Encounter for follow-up examination after completed treatment for malignant neoplasm: Secondary | ICD-10-CM | POA: Insufficient documentation

## 2017-02-26 DIAGNOSIS — C50111 Malignant neoplasm of central portion of right female breast: Secondary | ICD-10-CM | POA: Diagnosis present

## 2017-02-26 DIAGNOSIS — Z170 Estrogen receptor positive status [ER+]: Secondary | ICD-10-CM | POA: Insufficient documentation

## 2017-02-26 DIAGNOSIS — Z853 Personal history of malignant neoplasm of breast: Secondary | ICD-10-CM | POA: Insufficient documentation

## 2017-02-26 DIAGNOSIS — Z79811 Long term (current) use of aromatase inhibitors: Secondary | ICD-10-CM | POA: Insufficient documentation

## 2017-02-26 DIAGNOSIS — Z9889 Other specified postprocedural states: Secondary | ICD-10-CM | POA: Diagnosis not present

## 2017-02-26 DIAGNOSIS — Z7982 Long term (current) use of aspirin: Secondary | ICD-10-CM | POA: Diagnosis not present

## 2017-02-26 DIAGNOSIS — Z880 Allergy status to penicillin: Secondary | ICD-10-CM | POA: Diagnosis not present

## 2017-02-26 HISTORY — DX: Personal history of irradiation: Z92.3

## 2017-02-26 NOTE — Progress Notes (Signed)
Radiation Oncology         [phone] ________________________________  Name: MERRILYN LEGLER MRN: [id number]  Date: 02/26/2017  DOB: 1945/12/28  Follow-Up Visit Note  Outpatient  CC: MCNEILL,WENDY, MD  Magrinat, Virgie Dad, MD  Diagnosis and Prior Radiotherapy: Stage I  T1cN0M0 Central Right Breast Invasive Ductal Carcinoma, ER/PR Positive, Her2 Negative, Grade 2, status post right lumpectomy   Radiation treatment dates:   12/31/16 - 01/21/17 Site/dose:   Right breast treated to 42.56 Gy in 16 fractions      ICD-9-CM ICD-10-CM   1. Carcinoma of central portion of right breast in female, estrogen receptor positive ([place]) 174.1 C50.111    V86.0 Z17.0     CHIEF COMPLAINT: Here for follow-up and surveillance of right breast cancer  Narrative:  The patient returns today for routine follow-up.  She denies pain. She does have an occasional sharp pain to her right breast which comes and goes quickly. She reports her skin has healed well, and she is using radiaplex and vitamin e cream as needed. She does have some fatigue, and is tired in the evening. She is taking anastrozole daily. She has a survivorship appointment on 03/08/17 and an appointment with Dr. Jana Hakim on 04/17/17.                              ALLERGIES:  is allergic to sulfa antibiotics; albuterol; and penicillins.  Meds: Current Outpatient Prescriptions  Medication Sig Dispense Refill  . anastrozole (ARIMIDEX) 1 MG tablet Take 1 tablet (1 mg total) by mouth daily. 90 tablet 3  . aspirin 81 MG tablet Take 81 mg by mouth daily.    . budesonide-formoterol (SYMBICORT) 160-4.5 MCG/ACT inhaler Inhale 2 puffs into the lungs 2 (two) times daily. 1 Inhaler 5  . CALCIUM CITRATE-VITAMIN D PO Take 500 mg by mouth daily.    . cetirizine (ZYRTEC) 10 MG tablet Take 10 mg by mouth as needed for allergies.    . cholecalciferol (VITAMIN D) 1000 UNITS tablet Take 1,000 Units by mouth daily.    . Diclofenac Sodium (PENNSAID TD) Place onto the  skin 2 (two) times daily.    . hydrocortisone cream 1 % Apply 1 application topically 2 (two) times daily.    Marland Kitchen levalbuterol (XOPENEX HFA) 45 MCG/ACT inhaler Inhale two puffs every 4-6 hours if needed for cough or wheeze 1 Inhaler 1  . mesalamine (LIALDA) 1.2 G EC tablet Take 1.2 g by mouth daily with breakfast.     . montelukast (SINGULAIR) 10 MG tablet Take one tablet once daily as directed 30 tablet 10  . Multiple Vitamins-Iron (MULTIVITAMINS WITH IRON) TABS Take 1 tablet by mouth daily.    . Nutritional Supplements (ESTROVEN PO) Take by mouth.    Marland Kitchen omeprazole (PRILOSEC) 40 MG capsule Take one capsule twice daily as directed 60 capsule 5  . ranitidine (ZANTAC) 300 MG tablet Take one tablet once each evening during flare 30 tablet 5  . sodium chloride (OCEAN) 0.65 % nasal spray Place 1 spray into the nose at bedtime as needed. For congestion    . triamcinolone cream (KENALOG) 0.1 % Apply 1 application topically as needed.    . vitamin E 400 UNIT capsule Take 400 Units by mouth daily.     No current facility-administered medications for this encounter.     Physical Findings:  height is 4' 10.75" (1.492 m) and weight is 121 lb 12.8 oz (55.2 kg).  Her temperature is 98.4 F (36.9 C). Her blood pressure is 125/57 (abnormal) and her pulse is 66. Her oxygen saturation is 99%. .     General: Alert and oriented, in no acute distress   Breast:   slight erythema of the right breast.    Lab Findings: Lab Results  Component Value Date   WBC 7.2 02/06/2012   HGB 13.4 02/06/2012   HCT 40.7 02/06/2012   MCV 86.0 02/06/2012   PLT 256 02/06/2012    Radiographic Findings: No results found.  Impression/Plan:  Stage I  T1cN0M0 Central Right Breast Invasive Ductal Carcinoma, ER/PR Positive, Her2 Negative, Grade 2, status post right lumpectomy   We reviewed skin care and I encouraged her to switch to vitamin e lotion once she finishes her radiaplex. She has a survivorship appointment on 03/08/17  and an appointment with Dr. Jana Hakim on 04/17/17.  I encouraged her to continue with yearly mammography and followup with medical oncology. I will see her back on an as-needed basis. I have encouraged her to call if she has any issues or concerns in the future. I wished her the very best.      Eppie Gibson, MD   This document serves as a record of services personally performed by Eppie Gibson, MD. It was created on her behalf by Arlyce Harman, a trained medical scribe. The creation of this record is based on the scribe's personal observations and the provider's statements to them. This document has been checked and approved by the attending provider.

## 2017-02-26 NOTE — Progress Notes (Signed)
Cindy Arias is here for follow up of radiation completed 01/21/17 to her Right Breast. She denies pain. She does have an occasional sharp pain to her Right Breast which comes and goes quickly. She reports her skin has healed well, and she is using radiaplex and vitamin e cream as needed. She does have some fatigue, and is tired in the evening. She is taking anastrozole daily. She has a survivorship appointment on 03/08/17 and Dr. Jana Hakim on 04/17/17.   BP (!) 125/57   Pulse 66   Temp 98.4 F (36.9 C)   Ht 4' 10.75" (1.492 m)   Wt 121 lb 12.8 oz (55.2 kg)   LMP 02/13/2012   SpO2 99% Comment: room air  BMI 24.81 kg/m    Wt Readings from Last 3 Encounters:  02/26/17 121 lb 12.8 oz (55.2 kg)  01/14/17 123 lb 3.2 oz (55.9 kg)  01/08/17 122 lb 12.8 oz (55.7 kg)

## 2017-03-01 ENCOUNTER — Ambulatory Visit: Payer: Self-pay | Admitting: Radiation Oncology

## 2017-03-08 ENCOUNTER — Ambulatory Visit (HOSPITAL_BASED_OUTPATIENT_CLINIC_OR_DEPARTMENT_OTHER): Payer: Medicare Other | Admitting: Adult Health

## 2017-03-08 ENCOUNTER — Encounter: Payer: Self-pay | Admitting: Adult Health

## 2017-03-08 VITALS — BP 149/62 | HR 77 | Temp 98.1°F | Resp 18 | Ht 58.75 in | Wt 125.0 lb

## 2017-03-08 DIAGNOSIS — Z79811 Long term (current) use of aromatase inhibitors: Secondary | ICD-10-CM | POA: Diagnosis not present

## 2017-03-08 DIAGNOSIS — Z170 Estrogen receptor positive status [ER+]: Secondary | ICD-10-CM

## 2017-03-08 DIAGNOSIS — C50111 Malignant neoplasm of central portion of right female breast: Secondary | ICD-10-CM | POA: Diagnosis not present

## 2017-03-08 NOTE — Progress Notes (Signed)
CLINIC:  Survivorship   REASON FOR VISIT:  Routine follow-up post-treatment for a recent history of breast cancer.  BRIEF ONCOLOGIC HISTORY:    Carcinoma of central portion of right breast in female, estrogen receptor positive ([place])   10/17/2016 Initial Biopsy    Right breast core biopsy: IDC, grade 2, ER+(100%), PR+(95%), Ki67 10%, HER-2 negative (ratio 1.19).        11/21/2016 Surgery    Right breast lumpectomy ([place]): IDC, grade 2, 1.9cm, margins negative, 2 SLN negative. 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     Overall, Ms. WJakubowiczreports feeling quite well and is without questions or concerns.  She is unsure what her appointment today is for, or about.  She says she only got a recording on her machine about needing to come, but is unaware of the reason.      REVIEW OF SYSTEMS:  A 14-point review of systems was completed and was negative, except as noted above.   ONCOLOGY TREATMENT TEAM:  1. [facility]  [facility] 2. Medical Oncologist: Dr. MJana Hakim3. Radiation Oncologist: Dr. SIsidore Moos   PAST MEDICAL/SURGICAL HISTORY:  Past Medical History:  Diagnosis Date  . Arthritis    "bone on bone'  left knee  . Asthma   . Breast cancer ([place]   . Colitis     . Dysphagia   . Esophageal stricture   . GERD (gastroesophageal reflux disease)   . History of infertility   . History of radiation therapy 12/31/16- 01/21/17   Right Breast 42.56 Gy in 16 fractions  . Osteopenia   . Pneumonia   . Rosacea   . Ulcerative colitis ([facility]    Past Surgical History:  Procedure Laterality Date  . BREAST LUMPECTOMY WITH RADIOACTIVE SEED AND SENTINEL LYMPH NODE BIOPSY Right 11/21/2016   Procedure: BREAST LUMPECTOMY WITH RADIOACTIVE SEED AND SENTINEL LYMPH NODE BIOPSY;  Surgeon: BExcell Seltzer MD;  [place] [facility]  Service: General;  Laterality: Right;  . BUNIONECTOMY WITH HAMMERTOE RECONSTRUCTION Left 07/2014  . CATARACT EXTRACTION Right 10/2015  . ESOPHAGEAL DILATION    . Repair of Chiari Malformation  02/05/13  . SUBOCCIPITAL CRANIECTOMY CERVICAL LAMINECTOMY  02/13/2012   Procedure: SUBOCCIPITAL CRANIECTOMY CERVICAL LAMINECTOMY/DURAPLASTY;  Surgeon: JOphelia Charter MD;  [facility];  Service: Neurosurgery;  Laterality: N/A;  Suboccipital craniectomy with upper cervical laminectomy and duraplasty  . TUBAL LIGATION  1987     ALLERGIES:  Allergies  Allergen Reactions  . Sulfa Antibiotics Hives  . Albuterol Other (See Comments)    States she cannot tolerate albuterol without side effects  . Penicillins Hives and Other (See Comments)    Had reaction as a child and as an adult.  Had additional reaction but not clear on the specifics     CURRENT MEDICATIONS:  Outpatient Encounter Prescriptions as of 03/08/2017  Medication Sig  . anastrozole (ARIMIDEX) 1 MG tablet Take 1 tablet (1 mg total) by mouth daily.  Marland Kitchen aspirin 81 MG tablet Take 81 mg by mouth daily.  . budesonide-formoterol (SYMBICORT) 160-4.5 MCG/ACT inhaler Inhale 2 puffs into the lungs 2 (two) times daily.  Marland Kitchen CALCIUM CITRATE-VITAMIN D PO Take 500 mg by mouth daily.  . cetirizine (ZYRTEC) 10 MG tablet Take 10 mg by mouth as needed for allergies.  . cholecalciferol  (VITAMIN D) 1000 UNITS tablet Take 1,000 Units by mouth daily.  . Diclofenac Sodium (PENNSAID TD) Place onto the skin 2 (two) times daily.  . hydrocortisone cream 1 % Apply 1 application topically 2 (two) times daily.  Marland Kitchen levalbuterol (XOPENEX HFA) 45 MCG/ACT inhaler Inhale two puffs every 4-6 hours if needed for cough or wheeze  . mesalamine (LIALDA) 1.2 G EC tablet Take 1.2 g by mouth daily with breakfast.   . montelukast (SINGULAIR) 10 MG tablet Take one tablet once daily as directed  . Multiple Vitamins-Iron (MULTIVITAMINS WITH IRON) TABS Take 1 tablet by mouth daily.  . Nutritional Supplements (ESTROVEN PO) Take by mouth.  Marland Kitchen omeprazole (PRILOSEC) 40 MG capsule Take one capsule twice daily as directed  . ranitidine (ZANTAC) 300 MG tablet Take one tablet once each evening during flare  . sodium chloride (OCEAN) 0.65 % nasal spray Place 1 spray into the nose at bedtime as needed. For congestion  . triamcinolone cream (KENALOG) 0.1 % Apply 1 application topically as needed.  . vitamin E 400 UNIT capsule Take 400 Units by mouth daily.   No facility-administered encounter medications on file as of 03/08/2017.      ONCOLOGIC FAMILY HISTORY:  Family History  Problem Relation Age of Onset  . Cancer Mother        Liver- 2003 Lymphoma  . Rheum arthritis Mother   . Stroke Father   . Diabetes Maternal Grandfather        Type 2   . Heart disease Maternal Grandfather   . Hypertension Maternal Grandmother   . Heart disease Maternal Grandmother   . Osteoporosis Maternal Grandmother   . Anesthesia problems Neg Hx        SOCIAL HISTORY:  Social History   Social History  . Marital status: Married    Spouse name: N/A  . Number of children: N/A  . Years of education: N/A   Occupational History  . Not on file.   Social History Main Topics  . Smoking status: Never Smoker  . Smokeless tobacco: Never Used     Comment: she smoked for a few months in college  . Alcohol use 0.6 oz/week     1 Glasses of wine per week     Comment: 1 glass of wine socially once or twice a month  . Drug use: No     Comment: Smoked in college ~ 2 years  . Sexual activity: Not on file   Other Topics Concern  . Not on file   Social History Narrative  . No narrative on file      PHYSICAL EXAMINATION:  Vital Signs:   Vitals:   03/08/17 1320  BP: (!) 149/62  Pulse: 77  Resp: 18  Temp: 98.1 F (36.7 C)   Filed Weights   03/08/17 1320  Weight: 125 lb (56.7 kg)   General: Well-nourished, well-appearing female in no  acute distress.  She is unaccompanied today.   HEENT: Head is normocephalic.  Pupils equal and reactive to light. Conjunctivae clear without exudate.  Sclerae anicteric. Oral mucosa is pink, moist.  Oropharynx is pink without lesions or erythema.  Lymph: No cervical, supraclavicular, or infraclavicular lymphadenopathy noted on palpation.  Cardiovascular: Regular rate and rhythm.Marland Kitchen Respiratory: Clear to auscultation bilaterally. Chest expansion symmetric; breathing non-labored.  GI: Abdomen soft and round; non-tender, non-distended. Bowel sounds normoactive.  GU: Deferred.  Neuro: No focal deficits. Steady gait.  Psych: Mood and affect normal and appropriate for situation.  Extremities: No edema. Skin: Warm and dry.  LABORATORY DATA:  None for this visit.  DIAGNOSTIC IMAGING:  None for this visit.      ASSESSMENT AND PLAN:  Ms.. Arias is a 71 y.o. female with Stage IA right breast invasive ductal carcinoma, ER+/PR+/HER2-, diagnosed in 09/2016, treated with lumpectomy, adjuvant radiation therapy, and anti-estrogen therapy with Anastrozole beginning in 01/2017.  She presents to the Survivorship Clinic for our initial meeting and routine follow-up post-completion of treatment for breast cancer.    1. Stage IA right breast cancer:  Ms. Beissel is continuing to recover from definitive treatment for breast cancer. She will follow-up with her medical oncologist, Dr.  Jana Hakim in June, 2018 with history and physical exam per surveillance protocol.  She will continue her anti-estrogen therapy with Anastrozole daily. Thus far, she is tolerating the Anastrozole well, with minimal side effects. Today, a comprehensive survivorship care plan and treatment summary was reviewed with the patient today detailing her breast cancer diagnosis, treatment course, potential late/long-term effects of treatment, appropriate follow-up care with recommendations for the future, and patient education resources.  While reviewing with the patient, she interrupted me many times, seemed irritated, and told me that she didn't have any of the issues I was attempting to discuss.  I was unable to review the complete care plan with her, however, she did receive a copy of this in her survivorship packet.  A copy of this summary, along with a letter will be sent to the patient's primary care provider via mail/fax/In Basket message after today's visit.    2. Bone health:  Given Ms. Yoho's age/history of breast cancer and her current treatment regimen including anti-estrogen therapy with Anastrozole, she is at risk for bone demineralization.  Her last DEXA scan was in 2017 which reportedly showed osteopenia.  We reviewed consumption of foods rich in calcium, as well as increasing weight-bearing activities.  She was given education on specific activities to promote bone health.  3. Cancer screening:  Due to Ms. Barbuto's history and her age, she should receive screening for skin cancers, colon cancer, and gynecologic cancers.  The information and recommendations are listed on the patient's comprehensive care plan/treatment summary and were reviewed in detail with the patient.    4. Health maintenance and wellness promotion: Ms. Elliston was encouraged to consume 5-7 servings of fruits and vegetables per day. We reviewed the "Nutrition Rainbow" handout, as well as the handout "Take Control of Your Health  and Reduce Your Cancer Risk" from the [place].  She was also encouraged to engage in moderate to vigorous exercise for 30 minutes per day most days of the week. We discussed the [employer] fitness program, which is designed for cancer survivors to help them become more physically fit after cancer treatments.  She was instructed to limit her alcohol consumption and continue to abstain from tobacco use.     5.  Support services/counseling: It is not uncommon for this period of the patient's cancer care trajectory to be one of many emotions and stressors.  We discussed an opportunity for her to participate in the next session of [facility] ("Finding Your New Normal") support group series designed for patients after they have completed treatment.   Ms. Worthley was encouraged to take advantage of our many other support services programs, support groups, and/or counseling in coping with her new life as a cancer survivor after completing anti-cancer treatment. She was given information regarding our available services and encouraged to contact me with any questions or for help enrolling in any of our support group/programs.    Dispo:   -Return to cancer center in June for follow up with Dr. Jana Hakim -Mammogram in November or December when due.   -She is welcome to return back to the Survivorship Clinic at any time; no additional follow-up needed at this time.  -Consider referral back to survivorship as a long-term survivor for continued surveillance  A total of (30) minutes of face-to-face time was spent with this patient with greater than 50% of that time in counseling and care-coordination.   Gardenia Phlegm, NP Survivorship Program [facility] [phone]   Note: PRIMARY CARE PROVIDER Cari Caraway, [place] [phone]

## 2017-03-26 ENCOUNTER — Encounter: Payer: Medicare Other | Admitting: Adult Health

## 2017-04-08 ENCOUNTER — Telehealth: Payer: Self-pay | Admitting: Oncology

## 2017-04-08 NOTE — Telephone Encounter (Signed)
lvm to inform pt of r/s 6/20 appt to 6/22 at 1330 per GM in clinic 6/20

## 2017-04-17 ENCOUNTER — Other Ambulatory Visit: Payer: Medicare Other

## 2017-04-17 ENCOUNTER — Ambulatory Visit: Payer: Medicare Other | Admitting: Oncology

## 2017-04-19 ENCOUNTER — Ambulatory Visit: Payer: Medicare Other | Admitting: Oncology

## 2017-04-19 ENCOUNTER — Other Ambulatory Visit: Payer: Medicare Other

## 2017-04-26 ENCOUNTER — Telehealth (INDEPENDENT_AMBULATORY_CARE_PROVIDER_SITE_OTHER): Payer: Self-pay | Admitting: Orthopaedic Surgery

## 2017-04-26 NOTE — Telephone Encounter (Signed)
Patient called to let you know that her left knee is swollen and painful. Thank you

## 2017-04-28 NOTE — Progress Notes (Signed)
[place]  [phone] [phone]     ID: JUNETTA HEARN DOB: May 23, 1946  MR#: [id number]  SJG#:[id number]  Patient Care Team: Cari Caraway, MD as PCP - General (Family Medicine) Ganesh Deeg, Virgie Dad, MD as Consulting Physician (Oncology) Excell Seltzer, MD as Consulting Physician (General Surgery) Eppie Gibson, MD as Attending Physician (Radiation Oncology) Luberta Mutter, MD as Consulting Physician (Ophthalmology) Neldon Mc, Donnamarie Poag, MD as Consulting Physician (Allergy and Immunology) Arta Silence, MD as Consulting Physician (Gastroenterology) Delice Bison, Charlestine Massed, NP as Nurse Practitioner (Hematology and Oncology) Garald Balding, MD as Consulting Physician (Orthopedic Surgery) Chauncey Cruel, MD OTHER MD:  CHIEF COMPLAINT: Estrogen receptor positive breast cancer  CURRENT TREATMENT: Anastrozole   BREAST CANCER HISTORY: From the original intake note:  Kataleya had bilateral screening mammography with [facility] 10/04/2016 showing a possible new mass in the right breast central to the nipple. The breast density was category B. On 10/11/2016 she underwent ultrasonography of the right breast and axilla. This confirmed a 0.9 cm irregular mass in the central breast correlating with the mammography findings. The right axilla was sonographically benign.   On 10/17/2016 she underwent biopsy of the right breast mass in question, showing ([id number]) invasive ductal carcinoma, grade 2, estrogen receptor 100% positive, progesterone receptor 95% positive, both with strong staining intensity, with an MIB-1 of 10%, and no HER-2 amplification, the signals ratio 1.19 and the number per cell 1.90.  The patient's subsequent history is as detailed below  INTERVAL HISTORY: Bev returns today for follow-up of her estrogen receptor positive breast cancer. Since her last visit here she completed her radiation treatments. She generally  tolerated them well. She then started on anastrozole April 2018. She is tolerating this generally well. Hot flashes and vaginal dryness are not a major issue. She hasn't developed the arthralgias or myalgias that many patients can experience on this medication. She obtains it at a good price.   REVIEW OF SYSTEMS: She has chronic problems with mild rosacea. She found a hyaluronic cream used by radiation very helpful in general and would like that refilled overall however she has an excellent functional status and a review of systems today was benign   PAST MEDICAL HISTORY: Past Medical History:  Diagnosis Date  . Arthritis    "bone on bone'  left knee  . Asthma   . Breast cancer ([place])   . Colitis   . Dysphagia   . Esophageal stricture   . GERD (gastroesophageal reflux disease)   . History of infertility   . History of radiation therapy 12/31/16- 01/21/17   Right Breast 42.56 Gy in 16 fractions  . Osteopenia   . Pneumonia   . Rosacea   . Ulcerative colitis ([place])     PAST SURGICAL HISTORY: Past Surgical History:  Procedure Laterality Date  . BREAST LUMPECTOMY WITH RADIOACTIVE SEED AND SENTINEL LYMPH NODE BIOPSY Right 11/21/2016   Procedure: BREAST LUMPECTOMY WITH RADIOACTIVE SEED AND SENTINEL LYMPH NODE BIOPSY;  Surgeon: Excell Seltzer, MD;  Location: [place];  Service: General;  Laterality: Right;  . BUNIONECTOMY WITH HAMMERTOE RECONSTRUCTION Left 07/2014  . CATARACT EXTRACTION Right 10/2015  . ESOPHAGEAL DILATION    . Repair of Chiari Malformation  02/05/13  . SUBOCCIPITAL CRANIECTOMY CERVICAL LAMINECTOMY  02/13/2012   Procedure: SUBOCCIPITAL CRANIECTOMY CERVICAL LAMINECTOMY/DURAPLASTY;  Surgeon: Ophelia Charter, MD;  Location: [place] [facility];  Service: Neurosurgery;  Laterality: N/A;  Suboccipital craniectomy with upper cervical laminectomy and duraplasty  .  TUBAL LIGATION  1987    FAMILY HISTORY Family History  Problem Relation Age of Onset  . Cancer Mother         Liver- 2003 Lymphoma  . Rheum arthritis Mother   . Stroke Father   . Diabetes Maternal Grandfather        Type 2   . Heart disease Maternal Grandfather   . Hypertension Maternal Grandmother   . Heart disease Maternal Grandmother   . Osteoporosis Maternal Grandmother   . Anesthesia problems Neg Hx   The patient's father died at the age of 77 FOLLOWING his stroke. The patient's mother died of non-Hodgkin's lymphoma at the age of 56. The patient had no brothers, 1 sister. There is no history of breast or ovarian cancer in the family  GYNECOLOGIC HISTORY:  Patient's last menstrual period was 02/13/2012. Menarche age 17, first live birth age 72. The patient is GX P1. She went through the change of life in her 60s. She did not take hormone replacement.  SOCIAL HISTORY:  Ellise work for [employer], for the [employer] agency in for [employer]. She is now retired and does a lot of volunteering. Her husband Tyrone Nine is a retired Optometrist. They have been married 88 years as of January 2018. Their daughter Elmo Putt teaches special education in [place]. The patient has one grandchild. She attends [employer] [place]: Not in place   HEALTH MAINTENANCE: Social History  Substance Use Topics  . Smoking status: Never Smoker  . Smokeless tobacco: Never Used     Comment: she smoked for a few months in college  . Alcohol use 0.6 oz/week    1 Glasses of wine per week     Comment: 1 glass of wine socially once or twice a month     Colonoscopy: Due 2019  PAP: Up-to-date  Bone density: mild osteopenia   Allergies  Allergen Reactions  . Sulfa Antibiotics Hives  . Albuterol Other (See Comments)    States she cannot tolerate albuterol without side effects  . Penicillins Hives and Other (See Comments)    Had reaction as a child and as an adult. Had additional reaction but not clear on the specifics    Current Outpatient Prescriptions    Medication Sig Dispense Refill  . anastrozole (ARIMIDEX) 1 MG tablet Take 1 tablet (1 mg total) by mouth daily. 90 tablet 3  . aspirin 81 MG tablet Take 81 mg by mouth daily.    . budesonide-formoterol (SYMBICORT) 160-4.5 MCG/ACT inhaler Inhale 2 puffs into the lungs 2 (two) times daily. 1 Inhaler 5  . CALCIUM CITRATE-VITAMIN D PO Take 500 mg by mouth daily.    . cetirizine (ZYRTEC) 10 MG tablet Take 10 mg by mouth as needed for allergies.    . cholecalciferol (VITAMIN D) 1000 UNITS tablet Take 1,000 Units by mouth daily.    . clindamycin (CLINDAGEL) 1 % gel Apply topically 2 (two) times daily. 30 g 1  . Diclofenac Sodium (PENNSAID TD) Place onto the skin 2 (two) times daily.    . hyaluronate sodium (RADIAPLEXRX) GEL Use as directed 1 Tube 1  . levalbuterol (XOPENEX HFA) 45 MCG/ACT inhaler Inhale two puffs every 4-6 hours if needed for cough or wheeze 1 Inhaler 1  . mesalamine (LIALDA) 1.2 G EC tablet Take 1.2 g by mouth daily with breakfast.     . montelukast (SINGULAIR) 10 MG tablet Take one tablet once daily as directed 30  tablet 10  . Multiple Vitamins-Iron (MULTIVITAMINS WITH IRON) TABS Take 1 tablet by mouth daily.    . Nutritional Supplements (ESTROVEN PO) Take by mouth.    Marland Kitchen omeprazole (PRILOSEC) 40 MG capsule Take one capsule twice daily as directed 60 capsule 5  . ranitidine (ZANTAC) 300 MG tablet Take one tablet once each evening during flare 30 tablet 5  . sodium chloride (OCEAN) 0.65 % nasal spray Place 1 spray into the nose at bedtime as needed. For congestion    . vitamin E 400 UNIT capsule Take 400 Units by mouth daily.     No current facility-administered medications for this visit.     OBJECTIVE: Middle-aged white womanWho appears stated age  41:   04/29/17 1218  BP: (!) 122/56  Pulse: 70  Resp: 18  Temp: 98 F (36.7 C)     Body mass index is 25.94 kg/m.    ECOG FS:0 - Asymptomatic  Sclerae unicteric, pupils round and equal Oropharynx clear and moist No  cervical or supraclavicular adenopathy Lungs no rales or rhonchi Heart regular rate and rhythm Abd soft, nontender, positive bowel sounds MSK no focal spinal tenderness, no upper extremity lymphedema Neuro: nonfocal, well oriented, appropriate affect Breasts: The right breast has undergone lumpectomy and radiation. The cosmetic result is good. There is no significant erythema or hyperpigmentation. There is no evidence of local recurrence. The left breast is unremarkable. Both axillae are benign.   LAB RESULTS:  CMP     Component Value Date/Time   NA 140 04/29/2017 1202   K 4.2 04/29/2017 1202   CL 102 02/06/2012 1042   CO2 25 04/29/2017 1202   GLUCOSE 97 04/29/2017 1202   BUN 19.2 04/29/2017 1202   CREATININE 0.8 04/29/2017 1202   CALCIUM 9.8 04/29/2017 1202   PROT 7.3 04/29/2017 1202   ALBUMIN 4.1 04/29/2017 1202   AST 21 04/29/2017 1202   ALT 20 04/29/2017 1202   ALKPHOS 80 04/29/2017 1202   BILITOT 0.56 04/29/2017 1202   GFRNONAA >90 02/06/2012 1042   GFRAA >90 02/06/2012 1042    INo results found for: SPEP, UPEP  Lab Results  Component Value Date   WBC 5.9 04/29/2017   NEUTROABS 3.4 04/29/2017   HGB 14.0 04/29/2017   HCT 41.7 04/29/2017   MCV 86.6 04/29/2017   PLT 252 04/29/2017      Chemistry      Component Value Date/Time   NA 140 04/29/2017 1202   K 4.2 04/29/2017 1202   CL 102 02/06/2012 1042   CO2 25 04/29/2017 1202   BUN 19.2 04/29/2017 1202   CREATININE 0.8 04/29/2017 1202      Component Value Date/Time   CALCIUM 9.8 04/29/2017 1202   ALKPHOS 80 04/29/2017 1202   AST 21 04/29/2017 1202   ALT 20 04/29/2017 1202   BILITOT 0.56 04/29/2017 1202       No results found for: LABCA2  No components found for: LABCA125  No results for input(s): INR in the last 168 hours.  Urinalysis No results found for: COLORURINE, APPEARANCEUR, LABSPEC, PHURINE, GLUCOSEU, HGBUR, BILIRUBINUR, KETONESUR, PROTEINUR, UROBILINOGEN, NITRITE,  LEUKOCYTESUR   STUDIES: No results found.   ELIGIBLE FOR AVAILABLE RESEARCH PROTOCOL: no  ASSESSMENT: 71 y.o. Cindy Arias woman status post right breast overlapping sites biopsy 10/17/2016 for a clinical T1b N0, stage IA invasive ductal carcinoma, grade 2, estrogen and progesterone receptor positive, HER-2 nonamplified, with an MIB-1 of 10%  (1)  s/p right lumpectomy and sentinel lymph node sampling 11/21/2016  for aT1c pN0, stage IA invasive ductal carcinoma, grade 2, with close but negative margins  (2) Oncotype DX score of 16 predict a 10 year risk of recurrence outside the breast of 10% if the patient's only systemic therapy is tamoxifen for 5 years. It also predicts no benefit from adjuvant chemotherapy.with will be  (3) adjuvant radiation 12/31/16 - 01/21/17 Site/dose:Right breast treated to 42.56 Gy in 16 fractions  (4) anastrozole started 02/15/2017  PLAN: Bev is tolerating anastrozole well and the plan will be to continue that for a total of 5 years.   She will need a bone density every 2 years. Of course she also will continue her vitamin D supplementation and walking program.  We are refilling her radiaplex and Clindagel. She is already scheduled to follow-up with her primary care physician, Dr. Luna Fuse, after the November mammogram, accordingly she will return to see me in April 2019.  Incidentally Verlena had many comments regarding the poor functioning of our front office. I wish I could say she was strong. If she puts them that in writing I will make sure that they get to the right hands.  She knows to call for any other issues that may develop before her next visit here.    Marland Kitchen  Chauncey Cruel, MD   04/30/2017 9:14 PM Medical Oncology and Hematology [facility] [address] [place], [place] [address] Tel. [phone]    Fax. [phone]

## 2017-04-29 ENCOUNTER — Ambulatory Visit (HOSPITAL_BASED_OUTPATIENT_CLINIC_OR_DEPARTMENT_OTHER): Payer: Medicare Other | Admitting: Oncology

## 2017-04-29 ENCOUNTER — Other Ambulatory Visit (HOSPITAL_BASED_OUTPATIENT_CLINIC_OR_DEPARTMENT_OTHER): Payer: Medicare Other

## 2017-04-29 VITALS — BP 122/56 | HR 70 | Temp 98.0°F | Resp 18 | Ht <= 58 in | Wt 124.1 lb

## 2017-04-29 DIAGNOSIS — Z79811 Long term (current) use of aromatase inhibitors: Secondary | ICD-10-CM

## 2017-04-29 DIAGNOSIS — Z170 Estrogen receptor positive status [ER+]: Secondary | ICD-10-CM

## 2017-04-29 DIAGNOSIS — C50811 Malignant neoplasm of overlapping sites of right female breast: Secondary | ICD-10-CM

## 2017-04-29 DIAGNOSIS — C50111 Malignant neoplasm of central portion of right female breast: Secondary | ICD-10-CM

## 2017-04-29 DIAGNOSIS — C50919 Malignant neoplasm of unspecified site of unspecified female breast: Secondary | ICD-10-CM

## 2017-04-29 LAB — CBC WITH DIFFERENTIAL/PLATELET
BASO%: 0.9 % (ref 0.0–2.0)
Basophils Absolute: 0.1 10*3/uL (ref 0.0–0.1)
EOS ABS: 0.2 10*3/uL (ref 0.0–0.5)
EOS%: 2.5 % (ref 0.0–7.0)
HCT: 41.7 % (ref 34.8–46.6)
HGB: 14 g/dL (ref 11.6–15.9)
LYMPH%: 28.3 % (ref 14.0–49.7)
MCH: 29 pg (ref 25.1–34.0)
MCHC: 33.5 g/dL (ref 31.5–36.0)
MCV: 86.6 fL (ref 79.5–101.0)
MONO#: 0.6 10*3/uL (ref 0.1–0.9)
MONO%: 10.5 % (ref 0.0–14.0)
NEUT#: 3.4 10*3/uL (ref 1.5–6.5)
NEUT%: 57.8 % (ref 38.4–76.8)
Platelets: 252 10*3/uL (ref 145–400)
RBC: 4.81 10*6/uL (ref 3.70–5.45)
RDW: 13.6 % (ref 11.2–14.5)
WBC: 5.9 10*3/uL (ref 3.9–10.3)
lymph#: 1.7 10*3/uL (ref 0.9–3.3)

## 2017-04-29 LAB — COMPREHENSIVE METABOLIC PANEL
ALT: 20 U/L (ref 0–55)
ANION GAP: 8 meq/L (ref 3–11)
AST: 21 U/L (ref 5–34)
Albumin: 4.1 g/dL (ref 3.5–5.0)
Alkaline Phosphatase: 80 U/L (ref 40–150)
BUN: 19.2 mg/dL (ref 7.0–26.0)
CO2: 25 meq/L (ref 22–29)
Calcium: 9.8 mg/dL (ref 8.4–10.4)
Chloride: 107 mEq/L (ref 98–109)
Creatinine: 0.8 mg/dL (ref 0.6–1.1)
EGFR: 79 mL/min/{1.73_m2} — AB (ref >90)
GLUCOSE: 97 mg/dL (ref 70–140)
POTASSIUM: 4.2 meq/L (ref 3.5–5.1)
SODIUM: 140 meq/L (ref 136–145)
Total Bilirubin: 0.56 mg/dL (ref 0.20–1.20)
Total Protein: 7.3 g/dL (ref 6.4–8.3)

## 2017-04-29 NOTE — Telephone Encounter (Signed)
Please advise 

## 2017-04-30 ENCOUNTER — Other Ambulatory Visit: Payer: Self-pay | Admitting: *Deleted

## 2017-04-30 ENCOUNTER — Other Ambulatory Visit: Payer: Medicare Other

## 2017-04-30 LAB — VITAMIN D 25 HYDROXY (VIT D DEFICIENCY, FRACTURES): Vitamin D, 25-Hydroxy: 45.5 ng/mL (ref 30.0–100.0)

## 2017-04-30 MED ORDER — RADIAPLEXRX EX GEL: CUTANEOUS | 1 refills | Status: DC

## 2017-04-30 MED ORDER — CLINDAMYCIN PHOSPHATE 1 % EX GEL: Freq: Two times a day (BID) | CUTANEOUS | 1 refills | Status: DC

## 2017-04-30 NOTE — Telephone Encounter (Signed)
She will make an appt for next week wednesday

## 2017-05-02 ENCOUNTER — Encounter: Payer: Self-pay | Admitting: Oncology

## 2017-05-07 ENCOUNTER — Ambulatory Visit (INDEPENDENT_AMBULATORY_CARE_PROVIDER_SITE_OTHER): Payer: Medicare Other

## 2017-05-07 ENCOUNTER — Ambulatory Visit (INDEPENDENT_AMBULATORY_CARE_PROVIDER_SITE_OTHER): Payer: Medicare Other | Admitting: Orthopaedic Surgery

## 2017-05-07 ENCOUNTER — Encounter (INDEPENDENT_AMBULATORY_CARE_PROVIDER_SITE_OTHER): Payer: Self-pay

## 2017-05-07 ENCOUNTER — Encounter (INDEPENDENT_AMBULATORY_CARE_PROVIDER_SITE_OTHER): Payer: Self-pay | Admitting: Orthopaedic Surgery

## 2017-05-07 VITALS — BP 107/56 | HR 71 | Ht <= 58 in | Wt 124.0 lb

## 2017-05-07 DIAGNOSIS — M25562 Pain in left knee: Secondary | ICD-10-CM

## 2017-05-07 DIAGNOSIS — G8929 Other chronic pain: Secondary | ICD-10-CM | POA: Diagnosis not present

## 2017-05-07 MED ORDER — METHYLPREDNISOLONE ACETATE 40 MG/ML IJ SUSP
80.0000 mg | INTRAMUSCULAR | Status: AC | PRN
  Administered 2017-05-07: 80 mg

## 2017-05-07 MED ORDER — BUPIVACAINE HCL 0.5 % IJ SOLN
3.0000 mL | INTRAMUSCULAR | Status: AC | PRN
  Administered 2017-05-07: 3 mL via INTRA_ARTICULAR

## 2017-05-07 MED ORDER — LIDOCAINE HCL 2 % IJ SOLN
2.0000 mL | INTRAMUSCULAR | Status: AC | PRN
  Administered 2017-05-07: 2 mL

## 2017-05-07 NOTE — Progress Notes (Signed)
Office Visit Note   Patient: Cindy Arias           Date of Birth: Nov 22, 1945           MRN: [id number] Visit Date: 05/07/2017              Requested by: Cari Caraway, [place], [place] [address] PCP: Cari Caraway, MD   Assessment & Plan: Visit Diagnoses:  1. Chronic pain of left knee   Moderate osteoarthritis left knee predominantly in the medial compartment  Plan: Long discussion regarding x-ray findings, diagnosis of osteoarthritis treatment options. Today we'll try a cortisone injection to consider Visco supplementation in the future  Follow-Up Instructions: Return if symptoms worsen or fail to improve.   Orders:  Orders Placed This Encounter  Procedures  . XR KNEE 3 VIEW LEFT   No orders of the defined types were placed in this encounter.     Procedures: Large Joint Inj Date/Time: 05/07/2017 4:25 PM Performed by: Garald Balding Authorized by: Garald Balding   Consent Given by:  Patient Timeout: prior to procedure the correct patient, procedure, and site was verified   Indications:  Pain and joint swelling Location:  Knee Site:  L knee Prep: patient was prepped and draped in usual sterile fashion   Needle Size:  25 G Needle Length:  1.5 inches Approach:  Anteromedial Ultrasound Guidance: No   Fluoroscopic Guidance: No   Arthrogram: No   Medications:  80 mg methylPREDNISolone acetate 40 MG/ML; 3 mL bupivacaine 0.5 %; 2 mL lidocaine 2 % Aspiration Attempted: No   Patient tolerance:  Patient tolerated the procedure well with no immediate complications     Clinical Data: No additional findings.   Subjective: Chief Complaint  Patient presents with  . Left Knee - Pain, Edema  Cindy Arias was last seen in August 2017 with a diagnosis of osteoarthritis of her left knee. At the time x-ray findings revealed moderate arthritis with near bone-on-bone in the medial compartment. In the interim she's been diagnosed with right breast  cancer and has been treated successfully by Dr. Jana Arias.. Her left knee is causing her more pain with popping or clicking. She's not having too much trouble when she walks in the sand the beach but hard surfaces or uncomfortable. She's had recurrent swelling. Tried over-the-counter medicines and even [place]aid  HPI  Review of Systems   Objective: Vital Signs: BP (!) 107/56   Pulse 71   Ht 4' 10"  (1.473 m)   Wt 124 lb (56.2 kg)   LMP 02/13/2012   BMI 25.92 kg/m   Physical Exam  Ortho Exam left knee with full extension and flexion over 105. Increased varus with weightbearing. Mild diffuse medial joint tenderness. No effusion. Positive patellar crepitation. No lateral joint pain. No swelling distally. No calf pain. Painless range of motion of left hip. Straight leg raise negative.  Specialty Comments:  No specialty comments available.  Imaging: Xr Knee 3 View Left  Result Date: 05/07/2017 Films of the left knee were obtained in 3 projections standing. There is about 2 of varus. There is near bone-on-bone in the medial compartment with irregularity along both the femoral tibial joint surfaces. Peripheral osteophytes identified both medially and laterally. Patella tracks with slight lateral tilt. Osteophytes are also identified medially and laterally at the patellofemoral articulation. No ectopic calcification. Findings are consistent with moderate osteoarthritis    PMFS History: Patient Active Problem List   Diagnosis Date Noted  .  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 Malignant neoplasm of overlapping sites of right breast in female, estrogen receptor positive ([facility]) 11/19/2016   Past Medical History:  Diagnosis Date  . Arthritis    "bone on bone'  left knee  . Asthma   . Breast cancer ([facility])   . Colitis   . Dysphagia   . Esophageal stricture   . GERD (gastroesophageal reflux disease)   . History of  infertility   . History of radiation therapy 12/31/16- 01/21/17   Right Breast 42.56 Gy in 16 fractions  . Osteopenia   . Pneumonia   . Rosacea   . Ulcerative colitis ([facility])     Family History  Problem Relation Age of Onset  . Cancer Mother        Liver- 2003 Lymphoma  . Rheum arthritis Mother   . Stroke Father   . Diabetes Maternal Grandfather        Type 2   . Heart disease Maternal Grandfather   . Hypertension Maternal Grandmother   . Heart disease Maternal Grandmother   . Osteoporosis Maternal Grandmother   . Anesthesia problems Neg Hx     Past Surgical History:  Procedure Laterality Date  . BREAST LUMPECTOMY WITH RADIOACTIVE SEED AND SENTINEL LYMPH NODE BIOPSY Right 11/21/2016   Procedure: BREAST LUMPECTOMY WITH RADIOACTIVE SEED AND SENTINEL LYMPH NODE BIOPSY;  Surgeon: Excell Seltzer, MD;  Location: [facility];  Service: General;  Laterality: Right;  . BUNIONECTOMY WITH HAMMERTOE RECONSTRUCTION Left 07/2014  . CATARACT EXTRACTION Right 10/2015  . ESOPHAGEAL DILATION    . Repair of Chiari Malformation  02/05/13  . SUBOCCIPITAL CRANIECTOMY CERVICAL LAMINECTOMY  02/13/2012   Procedure: SUBOCCIPITAL CRANIECTOMY CERVICAL LAMINECTOMY/DURAPLASTY;  Surgeon: Ophelia Charter, MD;  Location: [facility];  Service: Neurosurgery;  Laterality: N/A;  Suboccipital craniectomy with upper cervical laminectomy and duraplasty  . TUBAL LIGATION  1987   Social History   Occupational History  . Not on file.   Social History Main Topics  . Smoking status: Never Smoker  . Smokeless tobacco: Never Used     Comment: she smoked for a few months in college  . Alcohol use 0.6 oz/week    1 Glasses of wine per week     Comment: 1 glass of wine socially once or twice a month  . Drug use: No     Comment: Smoked in college ~ 2 years  . Sexual activity: Not on file     Garald Balding, MD   Note - This record has been created using [employer].  Chart creation errors  have been sought, but may not always  have been located. Such creation errors do not reflect on  the standard of medical care.

## 2017-05-09 ENCOUNTER — Telehealth (INDEPENDENT_AMBULATORY_CARE_PROVIDER_SITE_OTHER): Payer: Self-pay | Admitting: Orthopaedic Surgery

## 2017-05-09 NOTE — Telephone Encounter (Signed)
yes

## 2017-05-09 NOTE — Telephone Encounter (Signed)
OK to do-

## 2017-05-09 NOTE — Telephone Encounter (Signed)
Patient called requesting a handicap plaque.  CB#[phone].  Thank you.

## 2017-05-10 NOTE — Telephone Encounter (Signed)
Spoke with pt and told her at front desk

## 2017-05-21 ENCOUNTER — Ambulatory Visit (INDEPENDENT_AMBULATORY_CARE_PROVIDER_SITE_OTHER): Payer: Medicare Other | Admitting: Allergy and Immunology

## 2017-05-21 ENCOUNTER — Encounter: Payer: Self-pay | Admitting: Allergy and Immunology

## 2017-05-21 VITALS — BP 132/72 | HR 76 | Resp 18

## 2017-05-21 DIAGNOSIS — K219 Gastro-esophageal reflux disease without esophagitis: Secondary | ICD-10-CM

## 2017-05-21 DIAGNOSIS — J3089 Other allergic rhinitis: Secondary | ICD-10-CM

## 2017-05-21 DIAGNOSIS — J4540 Moderate persistent asthma, uncomplicated: Secondary | ICD-10-CM
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
## 2017-05-21 NOTE — Patient Instructions (Signed)
  1. Treat inflammation:   A. Continue Symbicort 160-2 inhalations inhalations 1-2 times a day  B. Continue montelukast 10 mg daily  2. Continue treatment for reflux:   A. Continue omeprazole 40 mg 1-2 times a day  B. Add ranitidine 364m in evening while "sick"  3. Continue the following if needed:   A. antihistamine   B. Nasal saline  C. Mucinex DM  D. Xopenx HFA  4. Return to clinic in 1 year or earlier if problem   5. Obtain fall flu vaccine

## 2017-06-13 ENCOUNTER — Telehealth: Payer: Self-pay | Admitting: Allergy and Immunology

## 2017-06-13 NOTE — Telephone Encounter (Signed)
Patient is calling about her referral to Dr [facility] Is there a different ENT that [facility] would recommend (maybe within [facility]) that the patient can see She is frustrated with [facility] Please call patient tomorrow with information on another ENT - patient would prefer to schedule herself due to travel

## 2017-06-14 NOTE — Telephone Encounter (Signed)
Spoke to patient and she got the information that she needed and if she needs anything else she will call the office

## 2017-06-14 NOTE — Telephone Encounter (Signed)
Called the patient, her mailbox was full. Patient can see Dr. Benjamine Mola [phone] or Dr. Ernesto Rutherford [phone] they are both within [facility]. If she needs [place] to we can fax over her last Office notes, labs , etc.   Thanks

## 2017-06-17 ENCOUNTER — Other Ambulatory Visit: Payer: Self-pay | Admitting: *Deleted

## 2017-06-17 MED ORDER — BUDESONIDE-FORMOTEROL FUMARATE 160-4.5 MCG/ACT IN AERO: 2.0000 | INHALATION_SPRAY | Freq: Two times a day (BID) | RESPIRATORY_TRACT | 5 refills | Status: DC

## 2017-07-24 ENCOUNTER — Other Ambulatory Visit: Payer: Self-pay | Admitting: *Deleted

## 2017-07-24 MED ORDER — OMEPRAZOLE 40 MG PO CPDR: DELAYED_RELEASE_CAPSULE | ORAL | 5 refills | Status: DC

## 2017-12-06 ENCOUNTER — Telehealth: Payer: Self-pay | Admitting: Allergy and Immunology

## 2017-12-06 MED ORDER — BENZONATATE 100 MG PO CAPS: 100.0000 mg | ORAL_CAPSULE | Freq: Three times a day (TID) | ORAL | 0 refills | Status: DC | PRN

## 2017-12-06 MED ORDER — DOXYCYCLINE HYCLATE 100 MG PO TABS: 100.0000 mg | ORAL_TABLET | Freq: Two times a day (BID) | ORAL | 0 refills | Status: DC

## 2017-12-06 NOTE — Telephone Encounter (Signed)
Given duration of symptoms with productive cough not improving with nasal drainage will treat for presumed bacterial rhinosinusitis with doxycycline (PCN allergy) 189m twice a day x 7 days.  Recommend using Xopenex as needed for cough.   Also recommend nasal saline rinses.   Can prescribe tessalon perls 1014mtake 1 perle up to 3 times a day and provide enough for week supply.   Continue maintenance medications.

## 2017-12-06 NOTE — Telephone Encounter (Signed)
Pt called and said that she has been coughing about 2 weeks off and on and blowing nose and wants to see what she can take for cough and nose is getting sore./  [place] . [phone].

## 2017-12-06 NOTE — Telephone Encounter (Signed)
Spoke with pt. Pt has been advised about her abx and cough medicine. They both have been sent to the requested pharmacy.

## 2017-12-06 NOTE — Telephone Encounter (Signed)
Pt states she has had a productive cough for 2 weeks and congestion. She said sometimes it is green in color. She coughs more at night. She states that when she blows her nose, it can be bloody. She is only taking a zyrtec 10 mg daily. She has not taken anything OTC for these sx's. She is taking her Symbicort and Singulair as directed. She is not taking her Xopenex. She has used tessalon pearls in the past and it has helped provide relief. No chills or fever. Please advise.

## 2017-12-06 NOTE — Telephone Encounter (Signed)
Please adivse

## 2017-12-06 NOTE — Telephone Encounter (Signed)
Please get more information from pt.  Is cough productive? Is she needing to use her albuterol and how frequently? Is she taking her maintenance of Symbicort and singulair?  Using any nasal sprays?  Fevers/body aches? Other symptoms?

## 2017-12-09 ENCOUNTER — Other Ambulatory Visit (INDEPENDENT_AMBULATORY_CARE_PROVIDER_SITE_OTHER): Payer: Self-pay | Admitting: Orthopaedic Surgery

## 2017-12-11 ENCOUNTER — Other Ambulatory Visit (INDEPENDENT_AMBULATORY_CARE_PROVIDER_SITE_OTHER): Payer: Self-pay

## 2017-12-11 ENCOUNTER — Telehealth (INDEPENDENT_AMBULATORY_CARE_PROVIDER_SITE_OTHER): Payer: Self-pay | Admitting: Orthopaedic Surgery

## 2017-12-11 MED ORDER — DICLOFENAC SODIUM 1 % TD GEL: 2.0000 g | Freq: Four times a day (QID) | TRANSDERMAL | 3 refills | Status: DC

## 2017-12-11 NOTE — Telephone Encounter (Signed)
sent 

## 2017-12-11 NOTE — Telephone Encounter (Signed)
Patient called requesting a prescription refill of her Voltaren Gel be sent to [facility].  Patient is going out of town tomorrow and would like to pick it up before she leaves.

## 2017-12-20 IMAGING — MR MR FOOT*R* W/O CM
4 of 5 series · 18 of 40 positions shown · non-contrast
Comparison: None.

CLINICAL DATA: Intermittent right heel pain for several years which
has worsened over the past 3 months. Initial encounter.

EXAM:
MRI OF THE RIGHT FOREFOOT WITHOUT CONTRAST
TECHNIQUE: Multiplanar, multisequence MR imaging of the ankle was performed. No
intravenous contrast was administered.

[Series 3: T2 fat-sat · axial · 4.0mm · 0.31mm/px · z∈[-91,+44]mm · 9 of 29 slices shown (1 of 3)]
[im 1/29]
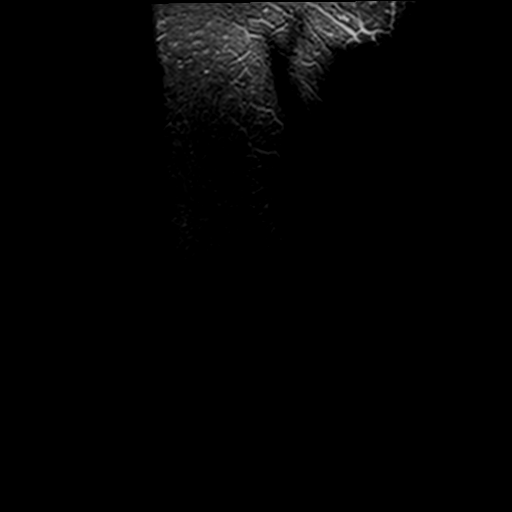
[im 4/29]
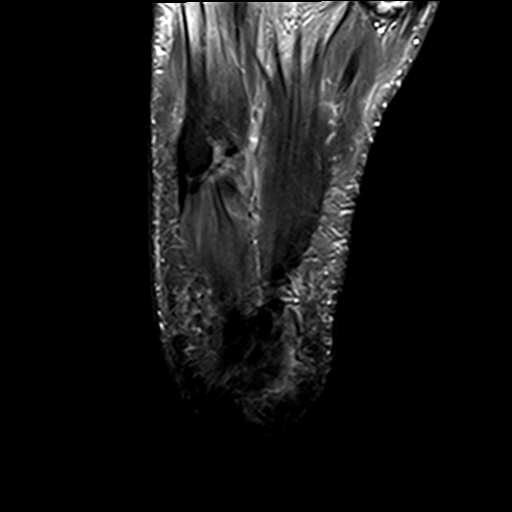
[im 8/29]
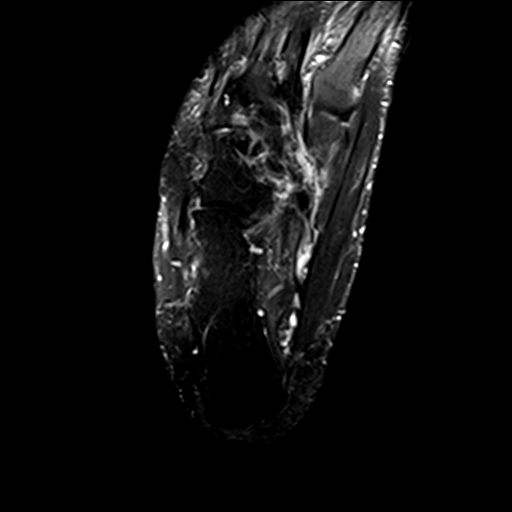
[im 11/29]
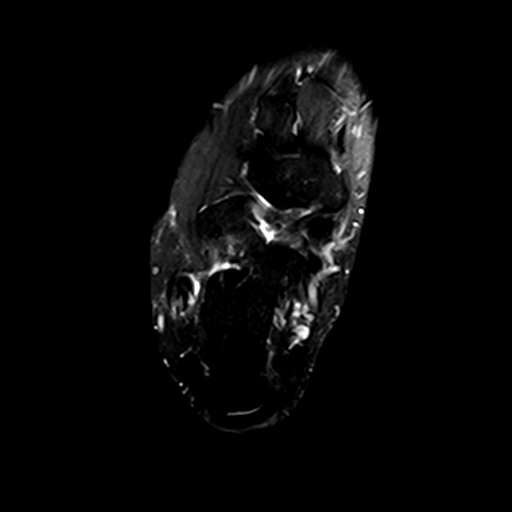
[im 15/29]
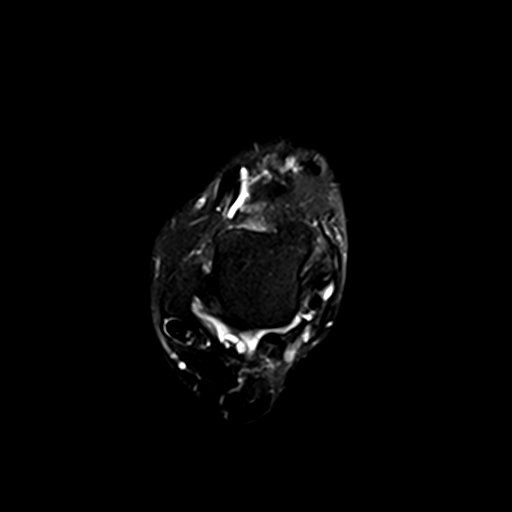
[im 18/29]
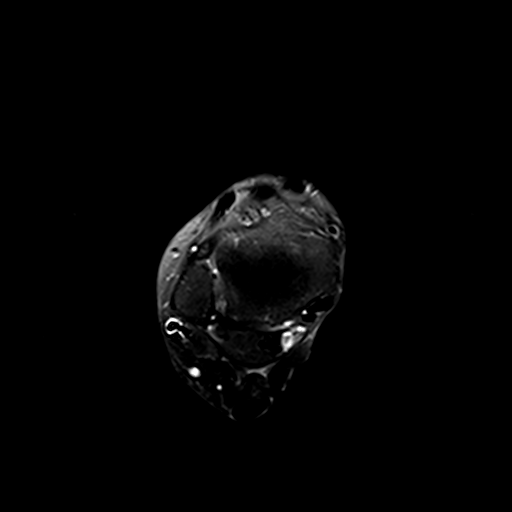
[im 22/29]
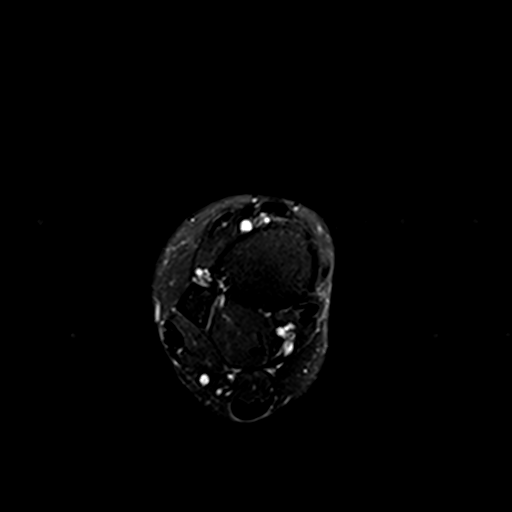
[im 25/29]
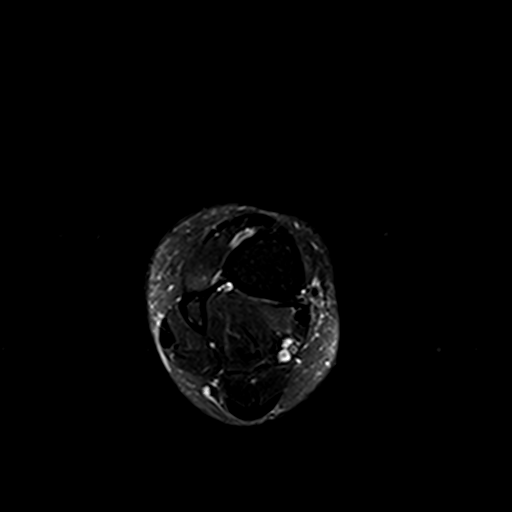
[im 29/29]
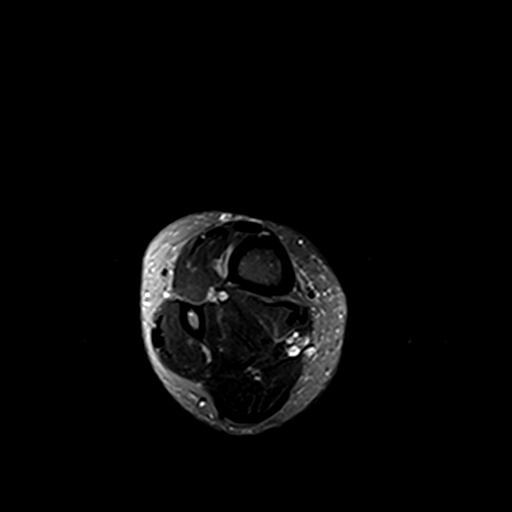

[Series 4: T1 · axial · 4.0mm · 0.25mm/px · z∈[-76,+25]mm · 3 of 29 slices shown]
[im 4/29]
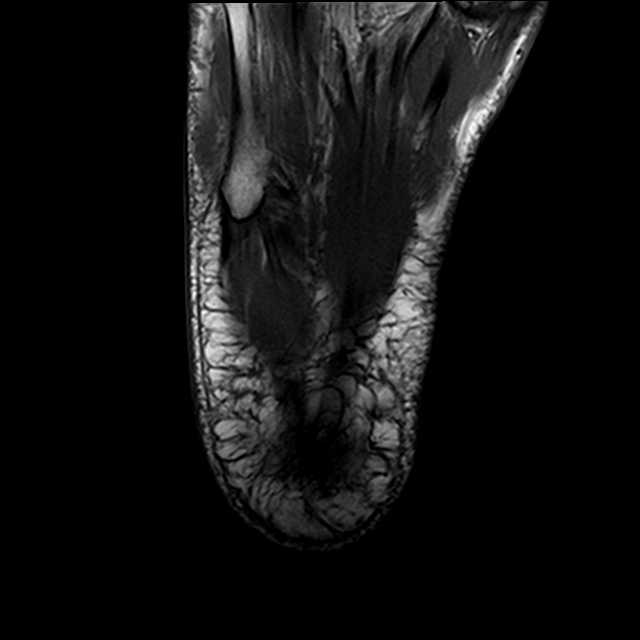
[im 15/29]
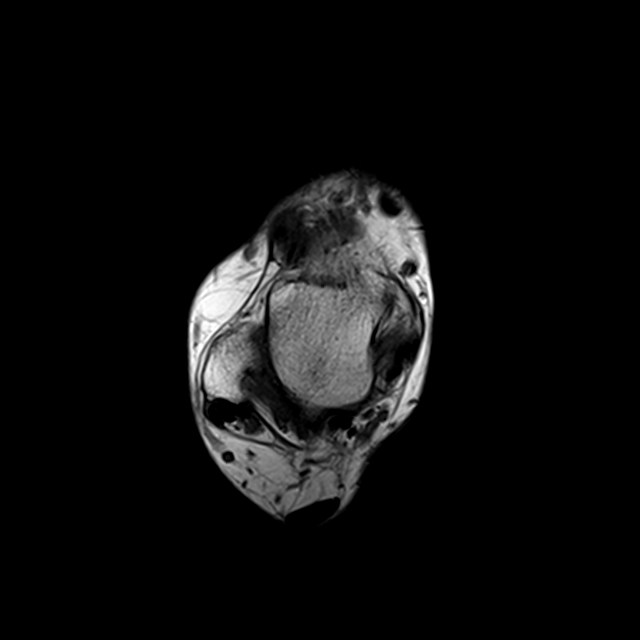
[im 25/29]
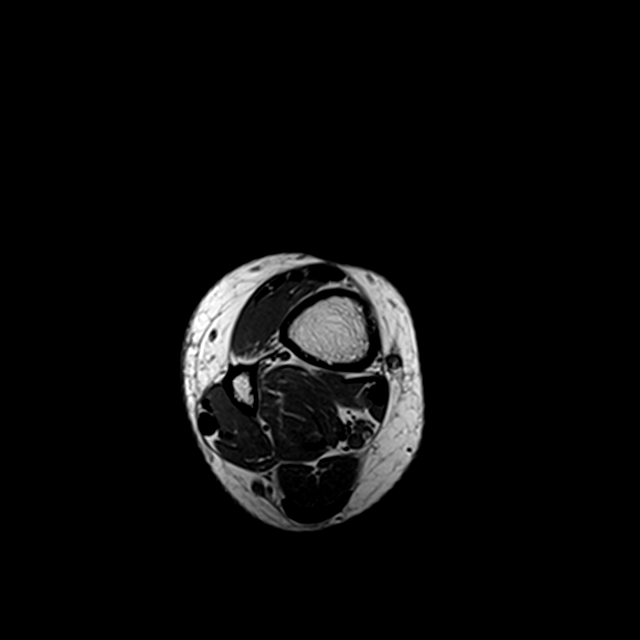

[Series 6: T2 fat-sat · sagittal · 3.0mm · 0.33mm/px · 3 of 21 slices shown (2 of 3)]
[im 4/21]
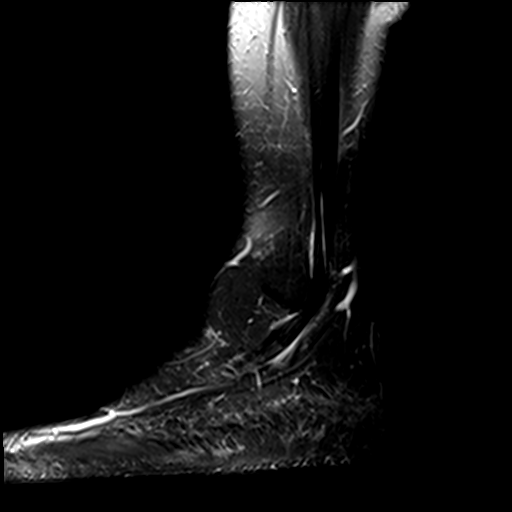
[im 11/21]
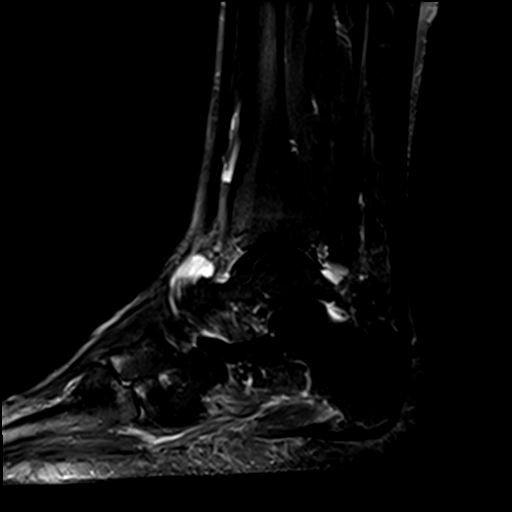
[im 17/21]
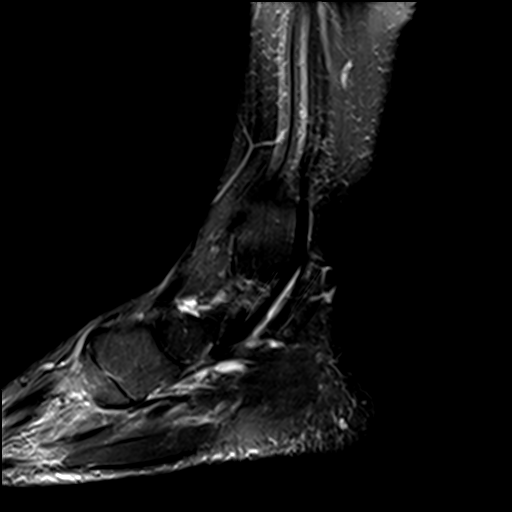

[Series 7: T2 fat-sat · coronal · 4.0mm · 0.33mm/px · 3 of 26 slices shown (3 of 3)]
[im 4/26]
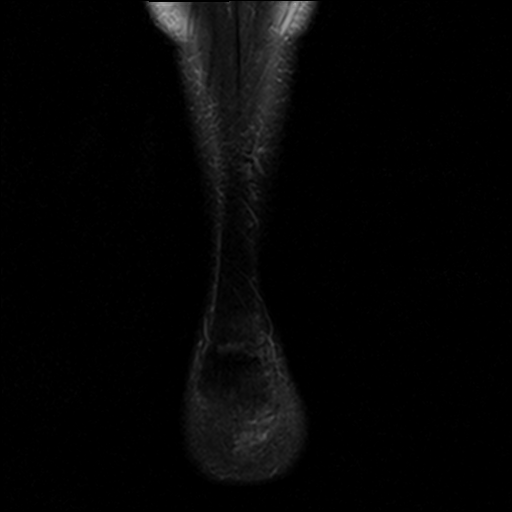
[im 15/26]
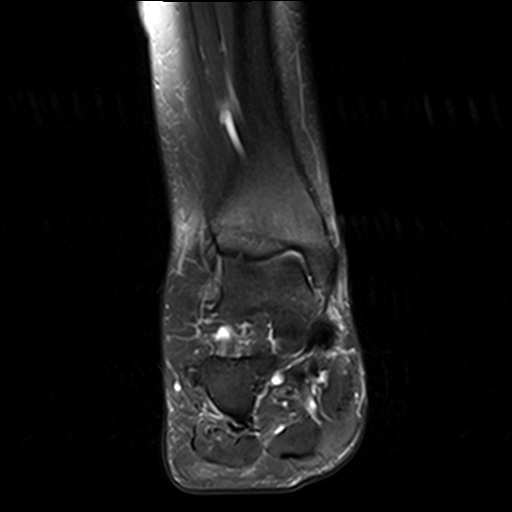
[im 22/26]
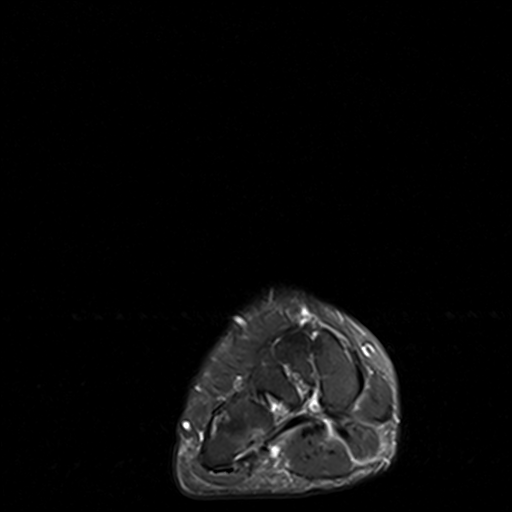

[18 of 40 positions shown; findings below may reference images not displayed]

FINDINGS: TENDONS

Peroneal: Fluid is seen in the sheathes of the peroneal tendons
consistent with tendinosis. Longitudinal split tearing of the
peroneus brevis at and just below the tip of the lateral malleolus
is identified.

Posteromedial: Intact.

Anterior: Intact.

Achilles: Intact.

Plantar Fascia: Thickening and intrasubstance increased T2 signal in
the plantar fascia at the calcaneus is worse in the medial cord. The
medial cord is partially torn from the calcaneus. Mild reactive
marrow edema is seen in a plantar calcaneal spur.

LIGAMENTS

Lateral: Intact.

Medial: Intact.

CARTILAGE

Ankle Joint: Unremarkable.

Subtalar Joints/Sinus Tarsi: Unremarkable.

Bones: No fracture or worrisome marrow lesion.

Other: None
IMPRESSION: Plantar fasciitis appearing worst in the medial cord where there is
partial tearing of the fascia from the calcaneus. Mild reactive
marrow edema in a plantar calcaneal spur is noted.

Tendinosis of the peroneal tendons with longitudinal split tearing
of the peroneus brevis.

## 2018-01-13 ENCOUNTER — Ambulatory Visit (INDEPENDENT_AMBULATORY_CARE_PROVIDER_SITE_OTHER): Payer: Self-pay

## 2018-01-13 ENCOUNTER — Ambulatory Visit (INDEPENDENT_AMBULATORY_CARE_PROVIDER_SITE_OTHER): Payer: Medicare Other | Admitting: Orthopaedic Surgery

## 2018-01-13 ENCOUNTER — Encounter (INDEPENDENT_AMBULATORY_CARE_PROVIDER_SITE_OTHER): Payer: Self-pay | Admitting: Orthopaedic Surgery

## 2018-01-13 VITALS — BP 110/58 | HR 73 | Resp 16 | Ht 59.0 in | Wt 115.0 lb

## 2018-01-13 DIAGNOSIS — M25561 Pain in right knee: Secondary | ICD-10-CM | POA: Diagnosis not present

## 2018-01-13 MED ORDER — LIDOCAINE HCL 1 % IJ SOLN
2.0000 mL | INTRAMUSCULAR | Status: AC | PRN
  Administered 2018-01-13: 2 mL

## 2018-01-13 MED ORDER — METHYLPREDNISOLONE ACETATE 40 MG/ML IJ SUSP
80.0000 mg | INTRAMUSCULAR | Status: AC | PRN
  Administered 2018-01-13: 80 mg

## 2018-01-13 MED ORDER — BUPIVACAINE HCL 0.5 % IJ SOLN
2.0000 mL | INTRAMUSCULAR | Status: AC | PRN
  Administered 2018-01-13: 2 mL via INTRA_ARTICULAR

## 2018-01-13 NOTE — Progress Notes (Signed)
Office Visit Note   Patient: Cindy Arias           Date of Birth: 06-May-1946           MRN: [id number] Visit Date: 01/13/2018              Requested by: Cari Caraway, [place], [place] [address] PCP: Cari Caraway, MD   Assessment & Plan: Visit Diagnoses:  1. Acute pain of right knee     Plan: Osteoarthritis right knee.  Discuss x-ray findings and treatment options.  I will inject the right knee with cortisone today and monitor her response  Follow-Up Instructions: Return if symptoms worsen or fail to improve.   Orders:  Orders Placed This Encounter  Procedures  . Large Joint Inj: R knee  . XR KNEE 3 VIEW RIGHT   No orders of the defined types were placed in this encounter.     Procedures: Large Joint Inj: R knee on 01/13/2018 4:26 PM Indications: pain and diagnostic evaluation Details: 25 G 1.5 in needle, anteromedial approach  Arthrogram: No  Medications: 2 mL lidocaine 1 %; 2 mL bupivacaine 0.5 %; 80 mg methylPREDNISolone acetate 40 MG/ML Procedure, treatment alternatives, risks and benefits explained, specific risks discussed. Consent was given by the patient. Immediately prior to procedure a time out was called to verify the correct patient, procedure, equipment, support staff and site/side marked as required. Patient was prepped and draped in the usual sterile fashion.       Clinical Data: No additional findings.   Subjective: Chief Complaint  Patient presents with  . Right Knee - Pain  . New Patient (Initial Visit)    right knee pain, no weakness, numbness or injury. started sat   Rather acute onset of right knee pain over the weekend to the point where she is having difficulty bearing weight.  No specific injury or trauma.  Has had slight swelling.  No numbness or tingling.  No calf pain and no distal edema.  Has history of osteoarthritis of her left knee and has done well with cortisone injection  HPI  Review of Systems    Constitutional: Negative for fatigue and fever.  HENT: Negative for ear pain.   Eyes: Negative for pain.  Respiratory: Negative for cough and shortness of breath.   Cardiovascular: Negative for leg swelling.  Gastrointestinal: Negative for constipation and diarrhea.  Endocrine: Negative for cold intolerance.  Genitourinary: Negative for dysuria.  Musculoskeletal: Negative for back pain and neck pain.  Skin: Negative for rash.  Allergic/Immunologic: Negative for food allergies.  Neurological: Negative for dizziness, weakness, light-headedness, numbness and headaches.  Hematological: Does not bruise/bleed easily.  Psychiatric/Behavioral: Negative for sleep disturbance.     Objective: Vital Signs: BP (!) 110/58 (BP Location: Left Arm, Patient Position: Sitting, Cuff Size: Normal)   Pulse 73   Resp 16   Ht 4' 11"  (1.499 m)   Wt 115 lb (52.2 kg)   LMP 02/13/2012   BMI 23.23 kg/m   Physical Exam  Ortho Exam awake alert and oriented x3.  Comfortable sitting.  Diffuse medial joint pain right knee with small effusion.  Some pain with knee extension flex knee over 90 no calf pain.  Neurovascular exam intact.  Straight leg raise negative no pain with range of motion of either hip  Specialty Comments:  No specialty comments available.  Imaging: Xr Knee 3 View Right  Result Date: 01/13/2018 Terms of the right knee were obtained in  3 projections standing.  There is narrowing of the medial joint space and minimal osteophyte formation about the patellofemoral joint.  All of the above consistent with moderate osteoarthritis.  Changes in the left knee from prior films  revealed more advanced arthritis    PMFS History: Patient Active Problem List   Diagnosis Date Noted  . 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 Malignant neoplasm of overlapping sites of right breast in female, estrogen receptor positive ([place]) 11/19/2016    Past Medical History:  Diagnosis Date  . Arthritis    "bone on bone'  left knee  . Asthma   . Breast cancer ([place])   . Colitis   . Dysphagia   . Esophageal stricture   . GERD (gastroesophageal reflux disease)   . History of infertility   . History of radiation therapy 12/31/16- 01/21/17   Right Breast 42.56 Gy in 16 fractions  . Osteopenia   . Pneumonia   . Rosacea   . Ulcerative colitis ([place])     Family History  Problem Relation Age of Onset  . Cancer Mother        Liver- 2003 Lymphoma  . Rheum arthritis Mother   . Stroke Father   . Diabetes Maternal Grandfather        Type 2   . Heart disease Maternal Grandfather   . Hypertension Maternal Grandmother   . Heart disease Maternal Grandmother   . Osteoporosis Maternal Grandmother   . Anesthesia problems Neg Hx     Past Surgical History:  Procedure Laterality Date  . BREAST LUMPECTOMY WITH RADIOACTIVE SEED AND SENTINEL LYMPH NODE BIOPSY Right 11/21/2016   Procedure: BREAST LUMPECTOMY WITH RADIOACTIVE SEED AND SENTINEL LYMPH NODE BIOPSY;  Surgeon: Excell Seltzer, MD;  Location: [facility];  Service: General;  Laterality: Right;  . BUNIONECTOMY WITH HAMMERTOE RECONSTRUCTION Left 07/2014  . CATARACT EXTRACTION Right 10/2015  . ESOPHAGEAL DILATION    . Repair of Chiari Malformation  02/05/13  . SUBOCCIPITAL CRANIECTOMY CERVICAL LAMINECTOMY  02/13/2012   Procedure: SUBOCCIPITAL CRANIECTOMY CERVICAL LAMINECTOMY/DURAPLASTY;  Surgeon: Ophelia Charter, MD;  Location: [facility];  Service: Neurosurgery;  Laterality: N/A;  Suboccipital craniectomy with upper cervical laminectomy and duraplasty  . TUBAL LIGATION  1987   Social History   Occupational History  . Not on file  Tobacco Use  . Smoking status: Never Smoker  . Smokeless tobacco: Never Used  . Tobacco comment: she smoked for a few months in college  Substance and Sexual Activity  . Alcohol use: Yes    Alcohol/week: 0.6 oz    Types: 1 Glasses of wine  per week    Comment: 1 glass of wine socially once or twice a month  . Drug use: No    Comment: Smoked in college ~ 2 years  . Sexual activity: Not on file

## 2018-01-19 ENCOUNTER — Other Ambulatory Visit: Payer: Self-pay | Admitting: Oncology

## 2018-01-27 NOTE — Progress Notes (Addendum)
[facility]  [phone] [phone]     ID: QUYNN VILCHIS DOB: 03/24/1946  MR#: [id number]  EPP#:[id number]  Patient Care Team: Cari Caraway, MD as PCP - General (Family Medicine) , Virgie Dad, MD as Consulting Physician (Oncology) Excell Seltzer, MD as Consulting Physician (General Surgery) Eppie Gibson, MD as Attending Physician (Radiation Oncology) Luberta Mutter, MD as Consulting Physician (Ophthalmology) Neldon Mc, Donnamarie Poag, MD as Consulting Physician (Allergy and Immunology) Arta Silence, MD as Consulting Physician (Gastroenterology) Delice Bison, Charlestine Massed, NP as Nurse Practitioner (Hematology and Oncology) Garald Balding, MD as Consulting Physician (Orthopedic Surgery) OTHER MD:  CHIEF COMPLAINT: Estrogen receptor positive breast cancer  CURRENT TREATMENT: Anastrozole   BREAST CANCER HISTORY: From the original intake note:  Sanja had bilateral screening mammography with tomography at [facility] 10/04/2016 showing a possible new mass in the right breast central to the nipple. The breast density was category B. On 10/11/2016 she underwent ultrasonography of the right breast and axilla. This confirmed a 0.9 cm irregular mass in the central breast correlating with the mammography findings. The right axilla was sonographically benign.   On 10/17/2016 she underwent biopsy of the right breast mass in question, showing ([id number]) invasive ductal carcinoma, grade 2, estrogen receptor 100% positive, progesterone receptor 95% positive, both with strong staining intensity, with an MIB-1 of 10%, and no HER-2 amplification, the signals ratio 1.19 and the number per cell 1.90.  The patient's subsequent history is as detailed below  INTERVAL HISTORY: Bev returns today for follow-up of her estrogen receptor positive breast cancer. She continues on anastrozole, with good tolerance. She reports minimal hot flashes worsened at night  which she contributes to her husband sleeping with a lot of covers. She notes vaginal dryness.  Since her last visit to the office, she had a diagnostic mammogram completed at [facility] on 12/10/2017 with results showing: Breast density composition B. There is no mammographic evidence of malignancy.   She also had a bone density scan completed on on 12/24/2017 with results showing osteopenia. She was prescribed ibandronate by her PCP and she has taken one dose a few weeks ago. She continues this with good tolerance and she is aware of how to properly take this medication.    REVIEW OF SYSTEMS: Emeri reports that for exercise, she used to walk a lot but that has changed due to weather and taking care of her husband. She belongs to the [employer]. She has been taking care of her husband more recently due to his recent ailments. She notes that her daughter after 3 rounds of IVF is now pregnant again with a naturally conceived baby. She has recently twisted her left knee and she is receiving cortisone injections in her knees given by orthopedist, Dr. Durward Fortes. She denies unusual headaches, visual changes, nausea, vomiting, or dizziness. There has been no unusual cough, phlegm production, or pleurisy. This been no change in bowel or bladder habits. She denies unexplained fatigue or unexplained weight loss, bleeding, rash, or fever. A detailed review of systems was otherwise stable.      PAST MEDICAL HISTORY: Past Medical History:  Diagnosis Date  . Arthritis    "bone on bone'  left knee  . Asthma   . Breast cancer ([place])   . Colitis   . Dysphagia   . Esophageal stricture   . GERD (gastroesophageal reflux disease)   . History of infertility   . History of radiation therapy 12/31/16- 01/21/17   Right Breast 42.56 Gy  in 16 fractions  . Osteopenia   . Pneumonia   . Rosacea   . Ulcerative colitis ([place])     PAST SURGICAL HISTORY: Past Surgical History:  Procedure Laterality Date  . BREAST  LUMPECTOMY WITH RADIOACTIVE SEED AND SENTINEL LYMPH NODE BIOPSY Right 11/21/2016   Procedure: BREAST LUMPECTOMY WITH RADIOACTIVE SEED AND SENTINEL LYMPH NODE BIOPSY;  Surgeon: Excell Seltzer, MD;  Location: [facility];  Service: General;  Laterality: Right;  . BUNIONECTOMY WITH HAMMERTOE RECONSTRUCTION Left 07/2014  . CATARACT EXTRACTION Right 10/2015  . ESOPHAGEAL DILATION    . Repair of Chiari Malformation  02/05/13  . SUBOCCIPITAL CRANIECTOMY CERVICAL LAMINECTOMY  02/13/2012   Procedure: SUBOCCIPITAL CRANIECTOMY CERVICAL LAMINECTOMY/DURAPLASTY;  Surgeon: Ophelia Charter, MD;  Location: [facility];  Service: Neurosurgery;  Laterality: N/A;  Suboccipital craniectomy with upper cervical laminectomy and duraplasty  . TUBAL LIGATION  1987    FAMILY HISTORY Family History  Problem Relation Age of Onset  . Cancer Mother        Liver- 2003 Lymphoma  . Rheum arthritis Mother   . Stroke Father   . Diabetes Maternal Grandfather        Type 2   . Heart disease Maternal Grandfather   . Hypertension Maternal Grandmother   . Heart disease Maternal Grandmother   . Osteoporosis Maternal Grandmother   . Anesthesia problems Neg Hx   The patient's father died at the age of 7 FOLLOWING his stroke. The patient's mother died of non-Hodgkin's lymphoma at the age of 40. The patient had no brothers, 1 sister. There is no history of breast or ovarian cancer in the family  GYNECOLOGIC HISTORY:  Patient's last menstrual period was 02/13/2012. Menarche age 62, first live birth age 35. The patient is GX P1. She went through the change of life in her 65s. She did not take hormone replacement.  SOCIAL HISTORY:  Pauleen work for [employer], for the [employer] agency in for [employer]. She is now retired and does a lot of volunteering. Her husband Tyrone Nine is a retired Optometrist. They have been married 2 years as of January 2018. Their daughter Elmo Putt teaches special  education in [place]. The patient has one grandchild. She attends [employer]: Not in place   HEALTH MAINTENANCE: Social History   Tobacco Use  . Smoking status: Never Smoker  . Smokeless tobacco: Never Used  . Tobacco comment: she smoked for a few months in college  Substance Use Topics  . Alcohol use: Yes    Alcohol/week: 0.6 oz    Types: 1 Glasses of wine per week    Comment: 1 glass of wine socially once or twice a month  . Drug use: No    Comment: Smoked in college ~ 2 years     Colonoscopy: Due 2019  PAP: Up-to-date  Bone density: mild osteopenia   Allergies  Allergen Reactions  . Sulfa Antibiotics Hives  . Albuterol Other (See Comments)    States she cannot tolerate albuterol without side effects  . Penicillins Hives and Other (See Comments)    Had reaction as a child and as an adult. Had additional reaction but not clear on the specifics    Current Outpatient Medications  Medication Sig Dispense Refill  . anastrozole (ARIMIDEX) 1 MG tablet TAKE 1 TABLET ONCE DAILY. 90 tablet 0  . aspirin 81 MG tablet Take 81 mg by mouth daily.    Marland Kitchen  budesonide-formoterol (SYMBICORT) 160-4.5 MCG/ACT inhaler Inhale 2 puffs into the lungs 2 (two) times daily. 1 Inhaler 5  . CALCIUM CITRATE-VITAMIN D PO Take 500 mg by mouth daily.    . cholecalciferol (VITAMIN D) 1000 UNITS tablet Take 1,000 Units by mouth daily.    . diclofenac sodium (VOLTAREN) 1 % GEL Apply 2 g topically 4 (four) times daily. 5 Tube 3  . hydrocortisone cream 1 % Apply 1 application topically as needed for itching.    . levalbuterol (XOPENEX HFA) 45 MCG/ACT inhaler Inhale two puffs every 4-6 hours if needed for cough or wheeze 1 Inhaler 1  . mesalamine (LIALDA) 1.2 G EC tablet Take 1.2 g by mouth daily with breakfast.     . montelukast (SINGULAIR) 10 MG tablet Take one tablet once daily as directed 30 tablet 10  . Multiple Vitamins-Iron (MULTIVITAMINS WITH IRON) TABS Take 1 tablet  by mouth daily.    Marland Kitchen omeprazole (PRILOSEC) 40 MG capsule Take one capsule twice daily as directed 60 capsule 5  . sodium chloride (OCEAN) 0.65 % nasal spray Place 1 spray into the nose at bedtime as needed. For congestion    . triamcinolone cream (KENALOG) 0.1 % Apply 1 application topically daily as needed.    . vitamin E 400 UNIT capsule Take 400 Units by mouth daily.     No current facility-administered medications for this visit.     OBJECTIVE: Middle-aged white woman in no acute distress  Vitals:   01/28/18 1147  BP: 129/70  Pulse: 75  Resp: 18  Temp: 98.3 F (36.8 C)  SpO2: 99%     Body mass index is 23.05 kg/m.    ECOG FS:0 - Asymptomatic  Sclerae unicteric, EOMs intact Oropharynx clear and moist No cervical or supraclavicular adenopathy Lungs no rales or rhonchi Heart regular rate and rhythm Abd soft, nontender, positive bowel sounds MSK no focal spinal tenderness, no upper extremity lymphedema Neuro: nonfocal, well oriented, appropriate affect Breasts: The right breast is status post lumpectomy and radiation with no evidence of local recurrence.  The left breast is benign.  Both axillae are benign.  LAB RESULTS:  CMP     Component Value Date/Time   NA 140 04/29/2017 1202   K 4.2 04/29/2017 1202   CL 102 02/06/2012 1042   CO2 25 04/29/2017 1202   GLUCOSE 97 04/29/2017 1202   BUN 19.2 04/29/2017 1202   CREATININE 0.8 04/29/2017 1202   CALCIUM 9.8 04/29/2017 1202   PROT 7.3 04/29/2017 1202   ALBUMIN 4.1 04/29/2017 1202   AST 21 04/29/2017 1202   ALT 20 04/29/2017 1202   ALKPHOS 80 04/29/2017 1202   BILITOT 0.56 04/29/2017 1202   GFRNONAA >90 02/06/2012 1042   GFRAA >90 02/06/2012 1042    INo results found for: SPEP, UPEP  Lab Results  Component Value Date   WBC 7.4 01/28/2018   NEUTROABS 5.0 01/28/2018   HGB 13.4 01/28/2018   HCT 41.4 01/28/2018   MCV 87.7 01/28/2018   PLT 287 01/28/2018      Chemistry      Component Value Date/Time   NA 140  04/29/2017 1202   K 4.2 04/29/2017 1202   CL 102 02/06/2012 1042   CO2 25 04/29/2017 1202   BUN 19.2 04/29/2017 1202   CREATININE 0.8 04/29/2017 1202      Component Value Date/Time   CALCIUM 9.8 04/29/2017 1202   ALKPHOS 80 04/29/2017 1202   AST 21 04/29/2017 1202   ALT 20 04/29/2017  1202   BILITOT 0.56 04/29/2017 1202       No results found for: LABCA2  No components found for: [id number]  No results for input(s): INR in the last 168 hours.  Urinalysis No results found for: COLORURINE, APPEARANCEUR, LABSPEC, PHURINE, GLUCOSEU, HGBUR, BILIRUBINUR, KETONESUR, PROTEINUR, UROBILINOGEN, NITRITE, LEUKOCYTESUR   STUDIES: Xr Knee 3 View Right  Result Date: 01/13/2018 Terms of the right knee were obtained in 3 projections standing.  There is narrowing of the medial joint space and minimal osteophyte formation about the patellofemoral joint.  All of the above consistent with moderate osteoarthritis.  Changes in the left knee from prior films  revealed more advanced arthritis  Since her last visit to the office, she had a diagnostic mammogram completed at [facility] on 12/10/2017 with results showing: Breast density composition B. There is no mammographic evidence of malignancy.   She also had a bone density scan completed on 12/24/2017 with results showing osteopenia.    ELIGIBLE FOR AVAILABLE RESEARCH PROTOCOL: no  ASSESSMENT: 72 y.o. [place] woman status post right breast overlapping sites biopsy 10/17/2016 for a clinical T1b N0, stage IA invasive ductal carcinoma, grade 2, estrogen and progesterone receptor positive, HER-2 nonamplified, with an MIB-1 of 10%  (1)  s/p right lumpectomy and sentinel lymph node sampling 11/21/2016 for aT1c pN0, stage IA invasive ductal carcinoma, grade 2, with close but negative margins  (2) Oncotype DX score of 16 predict a 10 year risk of recurrence outside the breast of 10% if the patient's only systemic therapy is tamoxifen for 5 years. It also  predicts no benefit from adjuvant chemotherapy.with will be  (3) adjuvant radiation 12/31/16 - 01/21/17 Site/dose:Right breast treated to 42.56 Gy in 16 fractions  (4) anastrozole started 02/15/2017  (a) bone density at [facility] 12/24/2017 shows a T score of -2.0.  (This is unchanged from baseline).  PLAN: Bev is now a little over a year from definitive surgery for her breast cancer with no evidence of disease recurrence.  This is favorable.  She is tolerating anastrozole remarkably well.  The plan will be to continue that for a total of 5 years.  The big concern with anastrozole of course is worsening osteoporosis.  She has moderate osteopenia which is stable by her most recent bone density.  She has been started on ibandronate and I think that is a great move.  She is tolerating it quite well.  I encouraged her to get back into a regular exercise routine and [facility]  [facility] this point I feel comfortable seeing her on a once a year basis.  She will call if any problems develop before her next visit.  , Virgie Dad, MD  01/28/18 12:12 PM Medical Oncology and Hematology [facility] [address] [place], [place] [address] Tel. [phone]    Fax. [phone]    This document serves as a record of services personally performed by Lurline Del, MD. It was created on his behalf by Steva Colder, a trained medical scribe. The creation of this record is based on the scribe's personal observations and the provider's statements to them.   I have reviewed the above documentation for accuracy and completeness, and I agree with the above.

## 2018-01-28 ENCOUNTER — Telehealth: Payer: Self-pay

## 2018-01-28 ENCOUNTER — Inpatient Hospital Stay: Payer: Medicare Other | Attending: Oncology | Admitting: Oncology

## 2018-01-28 ENCOUNTER — Inpatient Hospital Stay: Payer: Medicare Other

## 2018-01-28 VITALS — BP 129/70 | HR 75 | Temp 98.3°F | Resp 18 | Ht 59.0 in | Wt 114.1 lb

## 2018-01-28 DIAGNOSIS — Z7982 Long term (current) use of aspirin: Secondary | ICD-10-CM | POA: Insufficient documentation

## 2018-01-28 DIAGNOSIS — C50111 Malignant neoplasm of central portion of right female breast: Secondary | ICD-10-CM | POA: Diagnosis not present

## 2018-01-28 DIAGNOSIS — Z79811 Long term (current) use of aromatase inhibitors: Secondary | ICD-10-CM | POA: Insufficient documentation

## 2018-01-28 DIAGNOSIS — M8580 Other specified disorders of bone density and structure, unspecified site: Secondary | ICD-10-CM | POA: Diagnosis not present

## 2018-01-28 DIAGNOSIS — C50811 Malignant neoplasm of overlapping sites of right female breast: Secondary | ICD-10-CM | POA: Diagnosis present

## 2018-01-28 DIAGNOSIS — N951 Menopausal and female climacteric states: Secondary | ICD-10-CM | POA: Diagnosis not present

## 2018-01-28 DIAGNOSIS — Z79899 Other long term (current) drug therapy: Secondary | ICD-10-CM | POA: Insufficient documentation

## 2018-01-28 DIAGNOSIS — Z923 Personal history of irradiation: Secondary | ICD-10-CM | POA: Insufficient documentation

## 2018-01-28 DIAGNOSIS — Z170 Estrogen receptor positive status [ER+]: Secondary | ICD-10-CM | POA: Insufficient documentation

## 2018-01-28 LAB — COMPREHENSIVE METABOLIC PANEL
ALBUMIN: 4.2 g/dL (ref 3.5–5.0)
ALK PHOS: 91 U/L (ref 40–150)
ALT: 26 U/L (ref 0–55)
AST: 26 U/L (ref 5–34)
Anion gap: 9 (ref 3–11)
BUN: 19 mg/dL (ref 7–26)
CALCIUM: 9.9 mg/dL (ref 8.4–10.4)
CHLORIDE: 106 mmol/L (ref 98–109)
CO2: 24 mmol/L (ref 22–29)
CREATININE: 0.79 mg/dL (ref 0.60–1.10)
GFR calc non Af Amer: >60 mL/min (ref >60)
GLUCOSE: 101 mg/dL (ref 70–140)
Potassium: 4.4 mmol/L (ref 3.5–5.1)
SODIUM: 139 mmol/L (ref 136–145)
Total Bilirubin: 0.4 mg/dL (ref 0.2–1.2)
Total Protein: 7.4 g/dL (ref 6.4–8.3)

## 2018-01-28 LAB — CBC WITH DIFFERENTIAL/PLATELET
BASOS PCT: 1 %
Basophils Absolute: 0 10*3/uL (ref 0.0–0.1)
EOS ABS: 0.2 10*3/uL (ref 0.0–0.5)
EOS PCT: 2 %
HCT: 41.4 % (ref 34.8–46.6)
HEMOGLOBIN: 13.4 g/dL (ref 11.6–15.9)
LYMPHS ABS: 1.6 10*3/uL (ref 0.9–3.3)
Lymphocytes Relative: 22 %
MCH: 28.4 pg (ref 25.1–34.0)
MCHC: 32.4 g/dL (ref 31.5–36.0)
MCV: 87.7 fL (ref 79.5–101.0)
MONOS PCT: 8 %
Monocytes Absolute: 0.6 10*3/uL (ref 0.1–0.9)
NEUTROS PCT: 67 %
Neutro Abs: 5 10*3/uL (ref 1.5–6.5)
Platelets: 287 10*3/uL (ref 145–400)
RBC: 4.72 MIL/uL (ref 3.70–5.45)
RDW: 14 % (ref 11.2–14.5)
WBC: 7.4 10*3/uL (ref 3.9–10.3)

## 2018-01-28 NOTE — Telephone Encounter (Signed)
Contacted [facility] per Dr Jana Hakim and received pt's bone scan and mammogram results.  Stamped scan on sheets to be scanned in to pt's medical records after he reviews. Pt has a visit here today.

## 2018-01-29 LAB — VITAMIN D 25 HYDROXY (VIT D DEFICIENCY, FRACTURES): Vit D, 25-Hydroxy: 69 ng/mL (ref 30.0–100.0)

## 2018-01-30 ENCOUNTER — Telehealth: Payer: Self-pay

## 2018-01-30 NOTE — Telephone Encounter (Signed)
-----   Message from Gardenia Phlegm, NP sent at 01/30/2018  9:38 AM EDT ----- Please give patient normal vitamin d results ----- Message ----- From: Interface, Lab In [place] Sent: 01/28/2018  11:51 AM To: Chauncey Cruel, MD

## 2018-01-30 NOTE — Telephone Encounter (Signed)
Spoke with patient and gave vitamin D results which are normal.  Patient voiced understanding.  Knows to call center with questions/concerns.

## 2018-02-11 ENCOUNTER — Encounter (INDEPENDENT_AMBULATORY_CARE_PROVIDER_SITE_OTHER): Payer: Self-pay | Admitting: Orthopaedic Surgery

## 2018-02-11 ENCOUNTER — Ambulatory Visit (INDEPENDENT_AMBULATORY_CARE_PROVIDER_SITE_OTHER): Payer: Medicare Other | Admitting: Orthopaedic Surgery

## 2018-02-11 VITALS — BP 118/51 | HR 77 | Resp 14 | Ht 59.0 in | Wt 113.0 lb

## 2018-02-11 DIAGNOSIS — M1712 Unilateral primary osteoarthritis, left knee: Secondary | ICD-10-CM | POA: Diagnosis not present

## 2018-02-11 DIAGNOSIS — M1711 Unilateral primary osteoarthritis, right knee: Secondary | ICD-10-CM

## 2018-02-11 DIAGNOSIS — M170 Bilateral primary osteoarthritis of knee: Secondary | ICD-10-CM

## 2018-02-11 MED ORDER — BUPIVACAINE HCL 0.5 % IJ SOLN
2.0000 mL | INTRAMUSCULAR | Status: AC | PRN
  Administered 2018-02-11: 2 mL via INTRA_ARTICULAR

## 2018-02-11 MED ORDER — METHYLPREDNISOLONE ACETATE 40 MG/ML IJ SUSP
80.0000 mg | INTRAMUSCULAR | Status: AC | PRN
  Administered 2018-02-11: 80 mg

## 2018-02-11 MED ORDER — LIDOCAINE HCL 1 % IJ SOLN
2.0000 mL | INTRAMUSCULAR | Status: AC | PRN
  Administered 2018-02-11: 2 mL

## 2018-02-11 NOTE — Progress Notes (Signed)
Office Visit Note   Patient: Cindy Arias           Date of Birth: [birth year]           MRN: [id number] Visit Date: 02/11/2018              Requested by: Cari Caraway, [place],  [id number] PCP: Cari Caraway, MD   Assessment & Plan: Visit Diagnoses:  1. Bilateral primary osteoarthritis of knee     Plan: Cortisone injection left knee in anticipation of trip to [employer]  Follow-Up Instructions: Return if symptoms worsen or fail to improve.   Orders:  No orders of the defined types were placed in this encounter.  No orders of the defined types were placed in this encounter.     Procedures: Large Joint Inj: L knee on 02/11/2018 2:45 PM Indications: pain and diagnostic evaluation Details: 25 G 1.5 in needle, anteromedial approach  Arthrogram: No  Medications: 2 mL lidocaine 1 %; 2 mL bupivacaine 0.5 %; 80 mg methylPREDNISolone acetate 40 MG/ML Outcome: tolerated well, no immediate complications Procedure, treatment alternatives, risks and benefits explained, specific risks discussed. Consent was given by the patient. Patient was prepped and draped in the usual sterile fashion.       Clinical Data: No additional findings.   Subjective: Chief Complaint  Patient presents with  . Left Knee - Pain  . Right Knee - Pain  . Knee Pain    Bil knee pain, right knee swelling, difficulty walking, inj in right knee did not work, going to [employer], using cane, fluid in right knee, difficulty sleeping at night, throbbing, 01/11/18 twisted knee  Prior diagnosis of bilateral osteoarthritis of the knees.  Recently injected the right knee with some relief.  Has redeveloped some effusion and stiffness but having little bit more trouble on the left.  Injecting the left knee with cortisone today  HPI  Review of Systems   Objective: Vital Signs: BP (!) 118/51 (BP Location: Left Arm, Patient Position: Sitting, Cuff Size: Normal)   Pulse 77   Resp  14   Ht 4' 11"  (1.499 m)   Wt 113 lb (51.3 kg)   LMP 02/13/2012   BMI 22.82 kg/m   Physical Exam  Ortho Exam left knee without effusion.  Increased varus.  Predominately medial joint pain.  Some patellar crepitation.  Lacks a few degrees to full extension and flexes over 105 degrees.  No instability.  No calf pain.  No distal edema.  Neurovascular exam intact.  Straight leg raise negative.  Painless range of motion both hips  Specialty Comments:  No specialty comments available.  Imaging: No results found.   PMFS History: Patient Active Problem List   Diagnosis Date Noted  . Carcinoma of central portion of right breast in female, estrogen receptor positive ([place]) 12/21/2016  . Malignant neoplasm of overlapping sites of right breast in female, estrogen receptor positive ([place]) 11/19/2016   Past Medical History:  Diagnosis Date  . Arthritis    "bone on bone'  left knee  . Asthma   . Breast cancer ([place])   . Colitis   . Dysphagia   . Esophageal stricture   . GERD (gastroesophageal reflux disease)   . History of infertility   . History of radiation therapy 12/31/16- 01/21/17   Right Breast 42.56 Gy in 16 fractions  . Osteopenia   . Pneumonia   . Rosacea   . Ulcerative colitis ([place])  Family History  Problem Relation Age of Onset  . Cancer Mother        Liver- 2003 Lymphoma  . Rheum arthritis Mother   . Stroke Father   . Diabetes Maternal Grandfather        Type 2   . Heart disease Maternal Grandfather   . Hypertension Maternal Grandmother   . Heart disease Maternal Grandmother   . Osteoporosis Maternal Grandmother   . Anesthesia problems Neg Hx     Past Surgical History:  Procedure Laterality Date  . BREAST LUMPECTOMY WITH RADIOACTIVE SEED AND SENTINEL LYMPH NODE BIOPSY Right 11/21/2016   Procedure: BREAST LUMPECTOMY WITH RADIOACTIVE SEED AND SENTINEL LYMPH NODE BIOPSY;  Surgeon: Excell Seltzer, MD;  Location: [facility];  Service: General;   Laterality: Right;  . BUNIONECTOMY WITH HAMMERTOE RECONSTRUCTION Left 07/2014  . CATARACT EXTRACTION Right 10/2015  . ESOPHAGEAL DILATION    . Repair of Chiari Malformation  02/05/13  . SUBOCCIPITAL CRANIECTOMY CERVICAL LAMINECTOMY  02/13/2012   Procedure: SUBOCCIPITAL CRANIECTOMY CERVICAL LAMINECTOMY/DURAPLASTY;  Surgeon: Ophelia Charter, MD;  Location: [facility];  Service: Neurosurgery;  Laterality: N/A;  Suboccipital craniectomy with upper cervical laminectomy and duraplasty  . TUBAL LIGATION  1987   Social History   Occupational History  . Not on file  Tobacco Use  . Smoking status: Never Smoker  . Smokeless tobacco: Never Used  . Tobacco comment: she smoked for a few months in college  Substance and Sexual Activity  . Alcohol use: Yes    Comment: 1 glass of wine monthly  . Drug use: No    Comment: Smoked in college ~ 2 years  . Sexual activity: Not on file     Garald Balding, MD   Note - This record has been created using [employer].  Chart creation errors have been sought, but may not always  have been located. Such creation errors do not reflect on  the standard of medical care.

## 2018-02-11 NOTE — Progress Notes (Deleted)
Office Visit Note   Patient: Cindy Arias           Date of Birth: [birth year]           MRN: [id number] Visit Date: 02/11/2018              Requested by: Cari Caraway, [place], [place] [address] PCP: Cari Caraway, MD   Assessment & Plan: Visit Diagnoses:  1. Bilateral primary osteoarthritis of knee     Plan: ***  Follow-Up Instructions: Return if symptoms worsen or fail to improve.   Orders:  Orders Placed This Encounter  Procedures  . Large Joint Inj   No orders of the defined types were placed in this encounter.     Procedures: No procedures performed   Clinical Data: No additional findings.   Subjective: Chief Complaint  Patient presents with  . Left Knee - Pain  . Right Knee - Pain  . Knee Pain    Bil knee pain, right knee swelling, difficulty walking, inj in right knee did not work, going to [employer], using cane, fluid in right knee, difficulty sleeping at night, throbbing, 01/11/18 twisted knee    HPI  Review of Systems  Constitutional: Positive for fatigue.  HENT: Negative for trouble swallowing.   Eyes: Negative for pain.  Respiratory: Negative for shortness of breath.   Cardiovascular: Positive for leg swelling.  Gastrointestinal: Negative for constipation.  Endocrine: Negative for cold intolerance.  Genitourinary: Negative for difficulty urinating.  Musculoskeletal: Positive for joint swelling.  Skin: Negative for rash.  Allergic/Immunologic: Negative for food allergies.  Neurological: Negative for weakness.  Hematological: Does not bruise/bleed easily.  Psychiatric/Behavioral: Positive for sleep disturbance.     Objective: Vital Signs: BP (!) 118/51 (BP Location: Left Arm, Patient Position: Sitting, Cuff Size: Normal)   Pulse 77   Resp 14   Ht 4' 11"  (1.499 m)   Wt 113 lb (51.3 kg)   LMP 02/13/2012   BMI 22.82 kg/m   Physical Exam  Ortho Exam  Specialty Comments:  No specialty comments  available.  Imaging: No results found.   PMFS History: Patient Active Problem List   Diagnosis Date Noted  . 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 Malignant neoplasm of overlapping sites of right breast in female, estrogen receptor positive ([place]) 11/19/2016   Past Medical History:  Diagnosis Date  . Arthritis    "bone on bone'  left knee  . Asthma   . Breast cancer ([place])   . Colitis   . Dysphagia   . Esophageal stricture   . GERD (gastroesophageal reflux disease)   . History of infertility   . History of radiation therapy 12/31/16- 01/21/17   Right Breast 42.56 Gy in 16 fractions  . Osteopenia   . Pneumonia   . Rosacea   . Ulcerative colitis ([place])     Family History  Problem Relation Age of Onset  . Cancer Mother        Liver- 2003 Lymphoma  . Rheum arthritis Mother   . Stroke Father   . Diabetes Maternal Grandfather        Type 2   . Heart disease Maternal Grandfather   . Hypertension Maternal Grandmother   . Heart disease Maternal Grandmother   . Osteoporosis Maternal Grandmother   . Anesthesia problems Neg Hx     Past Surgical History:  Procedure Laterality Date  . BREAST LUMPECTOMY WITH  RADIOACTIVE SEED AND SENTINEL LYMPH NODE BIOPSY Right 11/21/2016   Procedure: BREAST LUMPECTOMY WITH RADIOACTIVE SEED AND SENTINEL LYMPH NODE BIOPSY;  Surgeon: Excell Seltzer, MD;  Location: [facility];  Service: General;  Laterality: Right;  . BUNIONECTOMY WITH HAMMERTOE RECONSTRUCTION Left 07/2014  . CATARACT EXTRACTION Right 10/2015  . ESOPHAGEAL DILATION    . Repair of Chiari Malformation  02/05/13  . SUBOCCIPITAL CRANIECTOMY CERVICAL LAMINECTOMY  02/13/2012   Procedure: SUBOCCIPITAL CRANIECTOMY CERVICAL LAMINECTOMY/DURAPLASTY;  Surgeon: Ophelia Charter, MD;  Location: [facility];  Service: Neurosurgery;  Laterality: N/A;  Suboccipital craniectomy with upper cervical laminectomy and duraplasty   . TUBAL LIGATION  1987   Social History   Occupational History  . Not on file  Tobacco Use  . Smoking status: Never Smoker  . Smokeless tobacco: Never Used  . Tobacco comment: she smoked for a few months in college  Substance and Sexual Activity  . Alcohol use: Yes    Comment: 1 glass of wine monthly  . Drug use: No    Comment: Smoked in college ~ 2 years  . Sexual activity: Not on file

## 2018-02-21 ENCOUNTER — Encounter (INDEPENDENT_AMBULATORY_CARE_PROVIDER_SITE_OTHER): Payer: Self-pay | Admitting: Orthopaedic Surgery

## 2018-02-21 ENCOUNTER — Ambulatory Visit (INDEPENDENT_AMBULATORY_CARE_PROVIDER_SITE_OTHER): Payer: Medicare Other | Admitting: Orthopaedic Surgery

## 2018-02-21 DIAGNOSIS — M170 Bilateral primary osteoarthritis of knee: Secondary | ICD-10-CM | POA: Insufficient documentation

## 2018-02-21 DIAGNOSIS — M1711 Unilateral primary osteoarthritis, right knee: Secondary | ICD-10-CM

## 2018-02-21 MED ORDER — LIDOCAINE HCL 1 % IJ SOLN
2.0000 mL | INTRAMUSCULAR | Status: AC | PRN
  Administered 2018-02-21: 2 mL

## 2018-02-21 MED ORDER — BUPIVACAINE HCL 0.5 % IJ SOLN
2.0000 mL | INTRAMUSCULAR | Status: AC | PRN
  Administered 2018-02-21: 2 mL via INTRA_ARTICULAR

## 2018-02-21 MED ORDER — METHYLPREDNISOLONE ACETATE 40 MG/ML IJ SUSP
80.0000 mg | INTRAMUSCULAR | Status: AC | PRN
  Administered 2018-02-21: 80 mg

## 2018-02-21 NOTE — Progress Notes (Signed)
Office Visit Note   Patient: Cindy Arias           Date of Birth: [birth year]           MRN: [id number] Visit Date: 02/21/2018              Requested by: Cari Caraway, MD [place], [place] [address] PCP: Cari Caraway, MD   Assessment & Plan: Visit Diagnoses:  1. Bilateral primary osteoarthritis of knee     Plan: Aspirate right knee and inject with cortisone.  Plan to see back as needed  Follow-Up Instructions: Return if symptoms worsen or fail to improve.   Orders:  Orders Placed This Encounter  Procedures  . Large Joint Inj: R knee   No orders of the defined types were placed in this encounter.     Procedures: Large Joint Inj: R knee on 02/21/2018 9:54 AM Indications: pain and diagnostic evaluation Details: 25 G 1.5 in needle, anteromedial approach  Arthrogram: No  Medications: 2 mL lidocaine 1 %; 2 mL bupivacaine 0.5 %; 80 mg methylPREDNISolone acetate 40 MG/ML Aspirate: 10 mL yellow and clear Outcome: tolerated well, no immediate complications Procedure, treatment alternatives, risks and benefits explained, specific risks discussed. Consent was given by the patient. Immediately prior to procedure a time out was called to verify the correct patient, procedure, equipment, support staff and site/side marked as required. Patient was prepped and draped in the usual sterile fashion.       Clinical Data: No additional findings.   Subjective: Chief Complaint  Patient presents with  . Right Knee - Pain  . Knee Pain    Right knee swelling x 1 month, no injury, no surgery to right knee, popping, clicking, grinding noises, throbbing at night, Motrin helps, not diabetic  Cindy Arias has had recurrent pain in both of her knees related to the osteoarthritis.  I recently injected the left knee with an excellent result.  She has been using Voltaren gel.  She has had onset of swelling and stiffness of her right knee and wanted to have it aspirated and  injected.  No recent injury of trauma.  No fever chills  HPI  Review of Systems  Constitutional: Positive for activity change.  HENT: Negative for trouble swallowing.   Eyes: Negative for pain.  Respiratory: Negative for shortness of breath.   Cardiovascular: Positive for leg swelling.  Gastrointestinal: Negative for constipation.  Endocrine: Negative for cold intolerance.  Genitourinary: Negative for difficulty urinating.  Musculoskeletal: Positive for joint swelling.  Skin: Negative for rash.  Allergic/Immunologic: Negative for food allergies.  Neurological: Positive for weakness.  Hematological: Does not bruise/bleed easily.  Psychiatric/Behavioral: Positive for sleep disturbance.     Objective: Vital Signs: BP 140/60 (BP Location: Left Arm, Patient Position: Sitting, Cuff Size: Normal)   Pulse 67   Resp 14   Ht 4' 9"  (1.448 m)   Wt 112 lb (50.8 kg)   LMP 02/13/2012   BMI 24.24 kg/m   Physical Exam  Constitutional: She is oriented to person, place, and time. She appears well-developed and well-nourished.  Eyes: Pupils are equal, round, and reactive to light. EOM are normal.  Pulmonary/Chest: Effort normal.  Neurological: She is alert and oriented to person, place, and time.  Skin: Skin is warm and dry.  Psychiatric: She has a normal mood and affect. Her behavior is normal.    Ortho Exam awake alert and oriented x3.  Comfortable sitting.  Positive effusion left knee.  Minimal  medial lateral joint pain.  Lacks a few degrees of extension related to the effusion.  Flex to about 100.  No instability.  No popliteal pain or mass or calf pain.  Specialty Comments:  No specialty comments available.  Imaging: No results found.   PMFS History: Patient Active Problem List   Diagnosis Date Noted  . Bilateral primary osteoarthritis of knee 02/21/2018  . 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 Malignant neoplasm  of overlapping sites of right breast in female, estrogen receptor positive ([place]) 11/19/2016   Past Medical History:  Diagnosis Date  . Arthritis    "bone on bone'  left knee  . Asthma   . Breast cancer ([facility])   . Colitis   . Dysphagia   . Esophageal stricture   . GERD (gastroesophageal reflux disease)   . History of infertility   . History of radiation therapy 12/31/16- 01/21/17   Right Breast 42.56 Gy in 16 fractions  . Osteopenia   . Pneumonia   . Rosacea   . Ulcerative colitis ([place])     Family History  Problem Relation Age of Onset  . Cancer Mother        Liver- 2003 Lymphoma  . Rheum arthritis Mother   . Stroke Father   . Diabetes Maternal Grandfather        Type 2   . Heart disease Maternal Grandfather   . Hypertension Maternal Grandmother   . Heart disease Maternal Grandmother   . Osteoporosis Maternal Grandmother   . Anesthesia problems Neg Hx     Past Surgical History:  Procedure Laterality Date  . BREAST LUMPECTOMY WITH RADIOACTIVE SEED AND SENTINEL LYMPH NODE BIOPSY Right 11/21/2016   Procedure: BREAST LUMPECTOMY WITH RADIOACTIVE SEED AND SENTINEL LYMPH NODE BIOPSY;  Surgeon: Excell Seltzer, MD;  Location: [facility];  Service: General;  Laterality: Right;  . BUNIONECTOMY WITH HAMMERTOE RECONSTRUCTION Left 07/2014  . CATARACT EXTRACTION Right 10/2015  . ESOPHAGEAL DILATION    . Repair of Chiari Malformation  02/05/13  . SUBOCCIPITAL CRANIECTOMY CERVICAL LAMINECTOMY  02/13/2012   Procedure: SUBOCCIPITAL CRANIECTOMY CERVICAL LAMINECTOMY/DURAPLASTY;  Surgeon: Ophelia Charter, MD;  Location: [facility];  Service: Neurosurgery;  Laterality: N/A;  Suboccipital craniectomy with upper cervical laminectomy and duraplasty  . TUBAL LIGATION  1987   Social History   Occupational History  . Not on file  Tobacco Use  . Smoking status: Never Smoker  . Smokeless tobacco: Never Used  . Tobacco comment: she smoked for a few months in college  Substance and  Sexual Activity  . Alcohol use: Yes    Comment: 1 glass of wine monthly  . Drug use: No    Comment: Smoked in college ~ 2 years  . Sexual activity: Not on file

## 2018-04-22 ENCOUNTER — Other Ambulatory Visit: Payer: Self-pay | Admitting: Oncology

## 2018-05-05 ENCOUNTER — Ambulatory Visit (INDEPENDENT_AMBULATORY_CARE_PROVIDER_SITE_OTHER): Payer: Medicare Other | Admitting: Orthopaedic Surgery

## 2018-05-05 ENCOUNTER — Encounter (INDEPENDENT_AMBULATORY_CARE_PROVIDER_SITE_OTHER): Payer: Self-pay | Admitting: Orthopaedic Surgery

## 2018-05-05 VITALS — BP 100/53 | HR 80 | Resp 16 | Ht 59.0 in | Wt 114.0 lb

## 2018-05-05 DIAGNOSIS — M1711 Unilateral primary osteoarthritis, right knee: Secondary | ICD-10-CM

## 2018-05-05 DIAGNOSIS — M170 Bilateral primary osteoarthritis of knee: Secondary | ICD-10-CM

## 2018-05-05 MED ORDER — LIDOCAINE HCL 1 % IJ SOLN
2.0000 mL | INTRAMUSCULAR | Status: AC | PRN
  Administered 2018-05-05: 2 mL

## 2018-05-05 MED ORDER — METHYLPREDNISOLONE ACETATE 40 MG/ML IJ SUSP
80.0000 mg | INTRAMUSCULAR | Status: AC | PRN
  Administered 2018-05-05: 80 mg

## 2018-05-05 MED ORDER — BUPIVACAINE HCL 0.5 % IJ SOLN
2.0000 mL | INTRAMUSCULAR | Status: AC | PRN
  Administered 2018-05-05: 2 mL via INTRA_ARTICULAR

## 2018-05-05 NOTE — Progress Notes (Signed)
Office Visit Note   Patient: Cindy Arias           Date of Birth: [birth year]           MRN: [id number] Visit Date: 05/05/2018              Requested by: Cari Caraway, [place], [place] [address] PCP: Cari Caraway, MD   Assessment & Plan: Visit Diagnoses:  1. Bilateral primary osteoarthritis of knee     Plan: recurrent OA symptoms right knee.  Will inject with cortisone and monitor response Follow-Up Instructions: Return if symptoms worsen or fail to improve.   Orders:  Orders Placed This Encounter  Procedures  . Large Joint Inj: R knee   No orders of the defined types were placed in this encounter.     Procedures: Large Joint Inj: R knee on 05/05/2018 3:31 PM Indications: pain and diagnostic evaluation Details: 25 G 1.5 in needle, anteromedial approach  Arthrogram: No  Medications: 2 mL lidocaine 1 %; 2 mL bupivacaine 0.5 %; 80 mg methylPREDNISolone acetate 40 MG/ML Procedure, treatment alternatives, risks and benefits explained, specific risks discussed. Consent was given by the patient. Immediately prior to procedure a time out was called to verify the correct patient, procedure, equipment, support staff and site/side marked as required. Patient was prepped and draped in the usual sterile fashion.       Clinical Data: No additional findings.   Subjective: Chief Complaint  Patient presents with  . Right Knee - Pain  . Knee Pain    Right knee and buttock pain x 3 weeks, working on nursery, swelling, poppking, clicking, grinding noises, difficulty walking, down stairs no injury, no surgery to right knee, not diabetic, Motrin helps some, fluid on knee  Has an established diagnosis of OA right knee is particular in the medial compartment.  Cortisone in the past has made a difference.  Has had some recurrent effusion and pain.  HPI  Review of Systems  Constitutional: Positive for activity change.  HENT: Negative for trouble  swallowing.   Eyes: Negative for pain.  Respiratory: Negative for shortness of breath.   Cardiovascular: Negative for leg swelling.  Gastrointestinal: Negative for constipation.  Endocrine: Negative for cold intolerance.  Genitourinary: Negative for difficulty urinating.  Musculoskeletal: Positive for gait problem and joint swelling.  Skin: Negative for rash.  Allergic/Immunologic: Negative for food allergies.  Neurological: Negative for weakness.  Hematological: Does not bruise/bleed easily.  Psychiatric/Behavioral: Negative for sleep disturbance.     Objective: Vital Signs: BP (!) 100/53 (BP Location: Left Arm, Patient Position: Sitting, Cuff Size: Normal)   Pulse 80   Resp 16   Ht 4' 11"  (1.499 m)   Wt 114 lb (51.7 kg)   LMP 02/13/2012   BMI 23.03 kg/m   Physical Exam  Constitutional: She is oriented to person, place, and time. She appears well-developed and well-nourished.  HENT:  Mouth/Throat: Oropharynx is clear and moist.  Eyes: Pupils are equal, round, and reactive to light. EOM are normal.  Pulmonary/Chest: Effort normal.  Neurological: She is alert and oriented to person, place, and time.  Skin: Skin is warm and dry.  Psychiatric: She has a normal mood and affect. Her behavior is normal.    Ortho Exam awake alert and oriented x3.  Comfortable sitting.  Examination of the right knee reveals a very small effusion.  Predominant medial joint pain.  Just lacking a few degrees of full knee extension over 105 of  flexion.  Small popliteal cyst that is asymptomatic.  No calf pain.  No distal edema.  Neurovascular exam intact  Specialty Comments:  No specialty comments available.  Imaging: No results found.   PMFS History: Patient Active Problem List   Diagnosis Date Noted  . Bilateral primary osteoarthritis of knee 02/21/2018  . Carcinoma of central portion of right breast in female, estrogen receptor positive ([place]) 12/21/2016  . Malignant neoplasm of overlapping  sites of right breast in female, estrogen receptor positive ([place]) 11/19/2016   Past Medical History:  Diagnosis Date  . Arthritis    "bone on bone'  left knee  . Asthma   . Breast cancer ([facility])   . Colitis   . Dysphagia   . Esophageal stricture   . GERD (gastroesophageal reflux disease)   . History of infertility   . History of radiation therapy 12/31/16- 01/21/17   Right Breast 42.56 Gy in 16 fractions  . Osteopenia   . Pneumonia   . Rosacea   . Ulcerative colitis ([facility])     Family History  Problem Relation Age of Onset  . Cancer Mother        Liver- 2003 Lymphoma  . Rheum arthritis Mother   . Stroke Father   . Diabetes Maternal Grandfather        Type 2   . Heart disease Maternal Grandfather   . Hypertension Maternal Grandmother   . Heart disease Maternal Grandmother   . Osteoporosis Maternal Grandmother   . Anesthesia problems Neg Hx     Past Surgical History:  Procedure Laterality Date  . BREAST LUMPECTOMY WITH RADIOACTIVE SEED AND SENTINEL LYMPH NODE BIOPSY Right 11/21/2016   Procedure: BREAST LUMPECTOMY WITH RADIOACTIVE SEED AND SENTINEL LYMPH NODE BIOPSY;  Surgeon: Excell Seltzer, MD;  Location: [facility];  Service: General;  Laterality: Right;  . BUNIONECTOMY WITH HAMMERTOE RECONSTRUCTION Left 07/2014  . CATARACT EXTRACTION Right 10/2015  . ESOPHAGEAL DILATION    . Repair of Chiari Malformation  02/05/13  . SUBOCCIPITAL CRANIECTOMY CERVICAL LAMINECTOMY  02/13/2012   Procedure: SUBOCCIPITAL CRANIECTOMY CERVICAL LAMINECTOMY/DURAPLASTY;  Surgeon: Ophelia Charter, MD;  Location: [facility];  Service: Neurosurgery;  Laterality: N/A;  Suboccipital craniectomy with upper cervical laminectomy and duraplasty  . TUBAL LIGATION  1987   Social History   Occupational History  . Not on file  Tobacco Use  . Smoking status: Never Smoker  . Smokeless tobacco: Never Used  . Tobacco comment: she smoked for a few months in college  Substance and Sexual  Activity  . Alcohol use: Yes    Comment: 1 glass of wine monthly  . Drug use: No    Comment: Smoked in college ~ 2 years  . Sexual activity: Not on file

## 2018-05-20 ENCOUNTER — Ambulatory Visit (INDEPENDENT_AMBULATORY_CARE_PROVIDER_SITE_OTHER): Payer: Medicare Other | Admitting: Allergy and Immunology

## 2018-05-20 ENCOUNTER — Encounter: Payer: Self-pay | Admitting: Allergy and Immunology

## 2018-05-20 VITALS — BP 100/62 | HR 71 | Temp 98.1°F | Ht 58.2 in | Wt 117.4 lb

## 2018-05-20 DIAGNOSIS — J4540 Moderate persistent asthma, uncomplicated: Secondary | ICD-10-CM | POA: Diagnosis not present

## 2018-05-20 DIAGNOSIS — K219 Gastro-esophageal reflux disease without esophagitis: Secondary | ICD-10-CM | POA: Diagnosis not present

## 2018-05-20 DIAGNOSIS — J3089 Other allergic rhinitis: Secondary | ICD-10-CM

## 2018-05-20 MED ORDER — OMEPRAZOLE 40 MG PO CPDR: DELAYED_RELEASE_CAPSULE | ORAL | 5 refills | Status: DC

## 2018-05-20 MED ORDER — MONTELUKAST SODIUM 10 MG PO TABS: ORAL_TABLET | ORAL | 11 refills | Status: DC

## 2018-05-20 MED ORDER — RANITIDINE HCL 300 MG PO CAPS: 300.0000 mg | ORAL_CAPSULE | Freq: Every evening | ORAL | 5 refills | Status: DC

## 2018-05-20 MED ORDER — LEVALBUTEROL TARTRATE 45 MCG/ACT IN AERO: INHALATION_SPRAY | RESPIRATORY_TRACT | 3 refills | Status: DC

## 2018-05-20 MED ORDER — BUDESONIDE-FORMOTEROL FUMARATE 160-4.5 MCG/ACT IN AERO: 2.0000 | INHALATION_SPRAY | Freq: Two times a day (BID) | RESPIRATORY_TRACT | 5 refills | Status: DC

## 2018-05-20 NOTE — Patient Instructions (Addendum)
  1. Treat inflammation:   A. Symbicort 160-2 inhalations inhalations 1-2 times a day  B. Montelukast 10 mg daily  2. Continue treatment for reflux:   A. Omeprazole 40 mg 1-2 times a day  B. Add ranitidine 340m in evening while "sick"  3. Continue the following if needed:   A. antihistamine   B. Nasal saline  C. Mucinex DM  D. Xopenx HFA  4. Return to clinic in 1 year or earlier if problem   5. Obtain fall flu vaccine

## 2018-05-20 NOTE — Progress Notes (Signed)
Follow-up Note  Referring Provider: Cari Caraway, MD Primary Provider: Cari Caraway, MD Date of Office Visit: 05/20/2018  Subjective:   Cindy Arias (DOB: [birth year]) is a 72 y.o. female who returns to the [facility] on 05/20/2018 in re-evaluation of the following:  HPI: Cindy Arias returns to this clinic in reevaluation of her asthma and allergic rhinitis and LPR.  I last saw her in this clinic in 21 May 2017.  She has had an excellent year with her respiratory tract.  She has not required a systemic steroid or antibiotic to treat any type of respiratory tract issue.  She rarely uses a short acting bronchodilator while utilizing Symbicort mostly 1 time per day as well as daily use of montelukast.  Her exercise is somewhat limited by a knee problem.  Her reflux is under excellent control using omeprazole mostly 1 time per day at this point.  On a rare occasion she will add in ranitidine.  Allergies as of 05/20/2018      Reactions   Sulfa Antibiotics Hives   Albuterol Other (See Comments)   States she cannot tolerate albuterol without side effects   Penicillins Hives, Other (See Comments)   Had reaction as a child and as an adult. Had additional reaction but not clear on the specifics      Medication List      anastrozole 1 MG tablet Commonly known as:  ARIMIDEX TAKE 1 TABLET ONCE DAILY.   aspirin 81 MG tablet Take 81 mg by mouth daily.   BONIVA 150 MG tablet Generic drug:  ibandronate Take 1 tablet (150 mg total) by mouth every 30 (thirty) days. Take in the morning with a full glass of water, on an empty stomach, and do not take anything else by mouth or lie down for the next 30 min.   budesonide-formoterol 160-4.5 MCG/ACT inhaler Commonly known as:  SYMBICORT Inhale 2 puffs into the lungs 2 (two) times daily.   CALCIUM CITRATE-VITAMIN D PO Take 500 mg by mouth daily.   cholecalciferol 1000 units tablet Commonly known as:  VITAMIN D Take  1,000 Units by mouth daily.   diclofenac sodium 1 % Gel Commonly known as:  VOLTAREN Apply 2 g topically 4 (four) times daily.   hydrocortisone cream 1 % Apply 1 application topically as needed for itching.   levalbuterol 45 MCG/ACT inhaler Commonly known as:  XOPENEX HFA Inhale two puffs every 4-6 hours if needed for cough or wheeze   mesalamine 1.2 g EC tablet Commonly known as:  LIALDA Take 1.2 g by mouth daily with breakfast.   montelukast 10 MG tablet Commonly known as:  SINGULAIR Take one tablet once daily as directed   multivitamins with iron Tabs tablet Take 1 tablet by mouth daily.   omeprazole 40 MG capsule Commonly known as:  PRILOSEC Take one capsule twice daily as directed   sodium chloride 0.65 % nasal spray Commonly known as:  OCEAN Place 1 spray into the nose at bedtime as needed. For congestion   triamcinolone cream 0.1 % Commonly known as:  KENALOG Apply 1 application topically daily as needed.   vitamin E 400 UNIT capsule Take 400 Units by mouth daily.       Past Medical History:  Diagnosis Date  . Arthritis    "bone on bone'  left knee  . Asthma   . Breast cancer ([place])   . Colitis   . Dysphagia   . Esophageal stricture   .  GERD (gastroesophageal reflux disease)   . History of infertility   . History of radiation therapy 12/31/16- 01/21/17   Right Breast 42.56 Gy in 16 fractions  . Osteopenia   . Pneumonia   . Rosacea   . Ulcerative colitis [facility])     Past Surgical History:  Procedure Laterality Date  . BREAST LUMPECTOMY WITH RADIOACTIVE SEED AND SENTINEL LYMPH NODE BIOPSY Right 11/21/2016   Procedure: BREAST LUMPECTOMY WITH RADIOACTIVE SEED AND SENTINEL LYMPH NODE BIOPSY;  Surgeon: Excell Seltzer, MD;  Location: [facility];  Service: General;  Laterality: Right;  . BUNIONECTOMY WITH HAMMERTOE RECONSTRUCTION Left 07/2014  . CATARACT EXTRACTION Right 10/2015  . ESOPHAGEAL DILATION    . Repair of Chiari Malformation   02/05/13  . SUBOCCIPITAL CRANIECTOMY CERVICAL LAMINECTOMY  02/13/2012   Procedure: SUBOCCIPITAL CRANIECTOMY CERVICAL LAMINECTOMY/DURAPLASTY;  Surgeon: Ophelia Charter, MD;  Location: [place] [facility]UR[facility];  Service: Neurosurgery;  Laterality: N/A;  Suboccipital craniectomy with upper cervical laminectomy and duraplasty  . TUBAL LIGATION  1987    Review of systems negative except as noted in HPI / PMHx or noted below:  Review of Systems  Constitutional: Negative.   HENT: Negative.   Eyes: Negative.   Respiratory: Negative.   Cardiovascular: Negative.   Gastrointestinal: Negative.   Genitourinary: Negative.   Musculoskeletal: Negative.   Skin: Negative.   Neurological: Negative.   Endo/Heme/Allergies: Negative.   Psychiatric/Behavioral: Negative.      Objective:   Vitals:   05/20/18 1139  BP: 100/62  Pulse: 71  Temp: 98.1 F (36.7 C)  SpO2: 97%   Height: 4' 10.2" (147.8 cm)  Weight: 117 lb 6.4 oz (53.3 kg)   Physical Exam  HENT:  Head: Normocephalic.  Right Ear: Tympanic membrane, external ear and ear canal normal.  Left Ear: Tympanic membrane, external ear and ear canal normal.  Nose: Nose normal. No mucosal edema or rhinorrhea.  Mouth/Throat: Uvula is midline, oropharynx is clear and moist and mucous membranes are normal. No oropharyngeal exudate.  Eyes: Conjunctivae are normal.  Neck: Trachea normal. No tracheal tenderness present. No tracheal deviation present. No thyromegaly present.  Cardiovascular: Normal rate, regular rhythm, S1 normal, S2 normal and normal heart sounds.  No murmur heard. Pulmonary/Chest: Breath sounds normal. No stridor. No respiratory distress. She has no wheezes. She has no rales.  Musculoskeletal: She exhibits no edema.  Lymphadenopathy:       Head (right side): No tonsillar adenopathy present.       Head (left side): No tonsillar adenopathy present.    She has no cervical adenopathy.  Neurological: She is alert.  Skin: No rash noted. She is  not diaphoretic. No erythema. Nails show no clubbing.    Diagnostics:    Spirometry was performed and demonstrated an FEV1 of 1.74 at 102 % of predicted.    Assessment and Plan:   1. Asthma, moderate persistent, well-controlled   2. Other allergic rhinitis   3. LPRD (laryngopharyngeal reflux disease)     1. Treat inflammation:   A. Symbicort 160 - 2 inhalations inhalations 1-2 times a day  B. Montelukast 10 mg daily  2. Continue treatment for reflux:   A. Omeprazole 40 mg 1-2 times a day  B. Add ranitidine 327m in evening while "sick"  3. Continue the following if needed:   A. antihistamine   B. Nasal saline  C. Mucinex DM  D. Xopenx HFA  4. Return to clinic in 1 year or earlier if problem   5.  Obtain fall flu vaccine   Jocilyn appears to be doing quite well on her current plan.  She has a very good understanding of how her medications work and the appropriate use of these medications and she will continue on anti-inflammatory agents for respiratory tract and treatment directed against reflux.  I will see her back in this clinic in 1 year or earlier if there is a problem.  Allena Katz, MD Allergy / [facility] [place]

## 2018-05-21 ENCOUNTER — Encounter: Payer: Self-pay | Admitting: Allergy and Immunology

## 2018-06-02 ENCOUNTER — Telehealth (INDEPENDENT_AMBULATORY_CARE_PROVIDER_SITE_OTHER): Payer: Self-pay | Admitting: Orthopaedic Surgery

## 2018-06-02 NOTE — Telephone Encounter (Signed)
Patient ready to schedule Lt knee surgery. Patient does have a few questions also in regards to the surgery. If you would, please call patient to discuss.

## 2018-06-02 NOTE — Telephone Encounter (Signed)
Please advise 

## 2018-06-04 NOTE — Telephone Encounter (Signed)
called

## 2018-07-07 ENCOUNTER — Other Ambulatory Visit: Payer: Self-pay | Admitting: Oncology

## 2018-08-04 ENCOUNTER — Encounter (INDEPENDENT_AMBULATORY_CARE_PROVIDER_SITE_OTHER): Payer: Self-pay | Admitting: Orthopaedic Surgery

## 2018-08-04 ENCOUNTER — Ambulatory Visit (INDEPENDENT_AMBULATORY_CARE_PROVIDER_SITE_OTHER): Payer: Medicare Other | Admitting: Orthopaedic Surgery

## 2018-08-04 VITALS — BP 108/60 | HR 72 | Ht 59.0 in | Wt 115.0 lb

## 2018-08-04 DIAGNOSIS — M1712 Unilateral primary osteoarthritis, left knee: Secondary | ICD-10-CM

## 2018-08-04 DIAGNOSIS — M170 Bilateral primary osteoarthritis of knee: Secondary | ICD-10-CM

## 2018-08-04 DIAGNOSIS — M1711 Unilateral primary osteoarthritis, right knee: Secondary | ICD-10-CM | POA: Diagnosis not present

## 2018-08-04 MED ORDER — METHYLPREDNISOLONE ACETATE 40 MG/ML IJ SUSP
80.0000 mg | INTRAMUSCULAR | Status: AC | PRN
  Administered 2018-08-04: 80 mg

## 2018-08-04 MED ORDER — BUPIVACAINE HCL 0.5 % IJ SOLN
2.0000 mL | INTRAMUSCULAR | Status: AC | PRN
  Administered 2018-08-04: 2 mL via INTRA_ARTICULAR

## 2018-08-04 MED ORDER — LIDOCAINE HCL 1 % IJ SOLN
2.0000 mL | INTRAMUSCULAR | Status: AC | PRN
  Administered 2018-08-04: 2 mL

## 2018-08-04 NOTE — Progress Notes (Signed)
Office Visit Note   Patient: Cindy Arias           Date of Birth: 05/03/1946           MRN: [id number] Visit Date: 08/04/2018              Requested by: Cindy Arias, [place], [place] [id number] PCP: Cindy Caraway, MD   Assessment & Plan: Visit Diagnoses:  1. Bilateral primary osteoarthritis of knee     Plan: Cortisone injection right knee.  Long discussion regarding issue with left knee osteoarthritis.  Zakya would like to proceed with a total knee replacement.  Long discussion regarding knee replacement, surgery, hospitalization, rehab therapy.  She like to schedule sometime around the first of the year.  We will give her a clearance form for Dr. Leonides Schanz  Follow-Up Instructions: Return will schedule left knee replacement.   Orders:  Orders Placed This Encounter  Procedures  . Large Joint Inj: R knee   No orders of the defined types were placed in this encounter.     Procedures: Large Joint Inj: R knee on 08/04/2018 4:56 PM Indications: pain and diagnostic evaluation Details: 25 G 1.5 in needle, anteromedial approach  Arthrogram: No  Medications: 2 mL lidocaine 1 %; 2 mL bupivacaine 0.5 %; 80 mg methylPREDNISolone acetate 40 MG/ML Procedure, treatment alternatives, risks and benefits explained, specific risks discussed. Consent was given by the patient. Immediately prior to procedure a time out was called to verify the correct patient, procedure, equipment, support staff and site/side marked as required. Patient was prepped and draped in the usual sterile fashion.       Clinical Data: No additional findings.   Subjective: Chief Complaint  Patient presents with  . Follow-up    L KNEE DISCUSS SURGERY, R KNEE IS CAUSING PAIN TOO, HAVING MORE ISSUES AND NOW STRUGGLES TO GET UP STAIRS  Prior diagnosis of end-stage osteoarthritis both knees.  Left knee has become more symptomatic.  Crystalmarie would like to consider knee replacement.  We have  had a long discussion today regarding the surgery and what she can expect.  She like to have a cortisone injection in the right knee.  She is reached a point where she does have compromise of her activities particularly with the left knee in terms of walking going up and down stairs.  She has had prior cortisone injection  HPI  Review of Systems  Constitutional: Negative for fatigue and fever.  HENT: Negative for ear pain.   Eyes: Negative for pain.  Respiratory: Negative for cough and shortness of breath.   Cardiovascular: Positive for leg swelling.  Gastrointestinal: Negative for constipation and diarrhea.  Genitourinary: Negative for difficulty urinating.  Musculoskeletal: Negative for back pain and neck pain.  Skin: Negative for rash.  Neurological: Positive for weakness. Negative for numbness.  Hematological: Does not bruise/bleed easily.  Psychiatric/Behavioral: Negative for sleep disturbance.     Objective: Vital Signs: BP 108/60 (BP Location: Left Arm, Patient Position: Sitting, Cuff Size: Normal)   Pulse 72   Ht 4' 11"  (1.499 m)   Wt 115 lb (52.2 kg)   LMP 02/13/2012   BMI 23.23 kg/m   Physical Exam  Constitutional: She is oriented to person, place, and time. She appears well-developed and well-nourished.  HENT:  Mouth/Throat: Oropharynx is clear and moist.  Eyes: Pupils are equal, round, and reactive to light. EOM are normal.  Pulmonary/Chest: Effort normal.  Neurological: She is alert and oriented to  person, place, and time.  Skin: Skin is warm and dry.  Psychiatric: She has a normal mood and affect. Her behavior is normal.    Ortho Exam awake alert and oriented x3.  Comfortable sitting.  Lacks a few degrees to full left knee extension.  No effusion.  Increased varus.  Predominant medial joint pain.  No calf pain.  No distal edema.  No popliteal pain.  No instability.  Flexed about 120 degrees.  Neurovascular exam intact.  Straight leg raise negative.  Painless  range of motion left hip  Specialty Comments:  No specialty comments available.  Imaging: No results found.   PMFS History: Patient Active Problem List   Diagnosis Date Noted  . Bilateral primary osteoarthritis of knee 02/21/2018  . Carcinoma of central portion of right breast in female, estrogen receptor positive ([place]) 12/21/2016  . Malignant neoplasm of overlapping sites of right breast in female, estrogen receptor positive ([place]) 11/19/2016   Past Medical History:  Diagnosis Date  . Arthritis    "bone on bone'  left knee  . Asthma   . Breast cancer ([place])   . Colitis   . Dysphagia   . Esophageal stricture   . GERD (gastroesophageal reflux disease)   . History of infertility   . History of radiation therapy 12/31/16- 01/21/17   Right Breast 42.56 Gy in 16 fractions  . Osteopenia   . Pneumonia   . Rosacea   . Ulcerative colitis ([place])     Family History  Problem Relation Age of Onset  . Cancer Mother        Liver- 2003 Lymphoma  . Rheum arthritis Mother   . Stroke Father   . Diabetes Maternal Grandfather        Type 2   . Heart disease Maternal Grandfather   . Hypertension Maternal Grandmother   . Heart disease Maternal Grandmother   . Osteoporosis Maternal Grandmother   . Anesthesia problems Neg Hx     Past Surgical History:  Procedure Laterality Date  . BREAST LUMPECTOMY WITH RADIOACTIVE SEED AND SENTINEL LYMPH NODE BIOPSY Right 11/21/2016   Procedure: BREAST LUMPECTOMY WITH RADIOACTIVE SEED AND SENTINEL LYMPH NODE BIOPSY;  Surgeon: Excell Seltzer, MD;  Location: [place];  Service: General;  Laterality: Right;  . BUNIONECTOMY WITH HAMMERTOE RECONSTRUCTION Left 07/2014  . CATARACT EXTRACTION Right 10/2015  . ESOPHAGEAL DILATION    . Repair of Chiari Malformation  02/05/13  . SUBOCCIPITAL CRANIECTOMY CERVICAL LAMINECTOMY  02/13/2012   Procedure: SUBOCCIPITAL CRANIECTOMY CERVICAL LAMINECTOMY/DURAPLASTY;  Surgeon: Ophelia Charter, MD;  Location:  [place] NEUR[facility]S;  Service: Neurosurgery;  Laterality: N/A;  Suboccipital craniectomy with upper cervical laminectomy and duraplasty  . TUBAL LIGATION  1987   Social History   Occupational History  . Not on file  Tobacco Use  . Smoking status: Never Smoker  . Smokeless tobacco: Never Used  . Tobacco comment: she smoked for a few months in college  Substance and Sexual Activity  . Alcohol use: Yes    Comment: 1 glass of wine monthly  . Drug use: No    Comment: Smoked in college ~ 2 years  . Sexual activity: Not on file

## 2018-08-05 ENCOUNTER — Telehealth (INDEPENDENT_AMBULATORY_CARE_PROVIDER_SITE_OTHER): Payer: Self-pay | Admitting: Orthopaedic Surgery

## 2018-08-05 NOTE — Telephone Encounter (Signed)
PLEASE ADVISE.

## 2018-08-05 NOTE — Telephone Encounter (Signed)
Patient called wanting to proceed with left knee surgery.  Your notes mention first of the year, but she would like to go ahead and schedule for November 26th.  Will this be ok?  I am holding this date for her, along with the H&P with Aaron Edelman and post op appointment.  Please advise. Thanks

## 2018-08-06 ENCOUNTER — Ambulatory Visit (INDEPENDENT_AMBULATORY_CARE_PROVIDER_SITE_OTHER): Payer: Medicare Other | Admitting: Surgery

## 2018-08-06 NOTE — Telephone Encounter (Signed)
FYI: Patient has rescheduled her L-TKA surgery to 11-04-2018. She has an appointment with PCP-Dr. Theadore Nan on 09-23-18 to obtain medical clearance.

## 2018-08-06 NOTE — Telephone Encounter (Signed)
Please see message below  Thank you

## 2018-08-06 NOTE — Telephone Encounter (Signed)
Ok to schedule.

## 2018-08-07 NOTE — Telephone Encounter (Signed)
Just an FYI

## 2018-08-20 ENCOUNTER — Inpatient Hospital Stay (INDEPENDENT_AMBULATORY_CARE_PROVIDER_SITE_OTHER): Payer: Medicare Other | Admitting: Orthopaedic Surgery

## 2018-09-17 ENCOUNTER — Ambulatory Visit (INDEPENDENT_AMBULATORY_CARE_PROVIDER_SITE_OTHER): Payer: Medicare Other | Admitting: Orthopedic Surgery

## 2018-10-06 ENCOUNTER — Inpatient Hospital Stay (INDEPENDENT_AMBULATORY_CARE_PROVIDER_SITE_OTHER): Payer: Medicare Other | Admitting: Orthopaedic Surgery

## 2018-10-10 ENCOUNTER — Other Ambulatory Visit: Payer: Self-pay | Admitting: Oncology

## 2018-10-15 ENCOUNTER — Encounter (INDEPENDENT_AMBULATORY_CARE_PROVIDER_SITE_OTHER): Payer: Self-pay | Admitting: Orthopaedic Surgery

## 2018-10-15 ENCOUNTER — Ambulatory Visit (INDEPENDENT_AMBULATORY_CARE_PROVIDER_SITE_OTHER): Payer: Medicare Other | Admitting: Orthopaedic Surgery

## 2018-10-15 VITALS — BP 131/69 | HR 94 | Ht 60.0 in | Wt 115.0 lb

## 2018-10-15 DIAGNOSIS — M1712 Unilateral primary osteoarthritis, left knee: Secondary | ICD-10-CM | POA: Diagnosis not present

## 2018-10-15 NOTE — H&P (Addendum)
Subjective:  Chief Complaint:left knee pain.  HPI: Cindy Arias, 72 y.o. female, has a history of pain and functional disability in the left knee due to arthritis and has failed non-surgical conservative treatments for greater than 12 weeks to includeNSAID's and/or analgesics, corticosteriod injections, flexibility and strengthening excercises, use of assistive devices and activity modification.  Onset of symptoms was gradual, starting 4 years ago with gradually worsening course since that time. The patient noted no past surgery on the left knee(s).  Patient currently rates pain in the left knee(s) at 7 out of 10 with activity. Patient has night pain, worsening of pain with activity and weight bearing, pain that interferes with activities of daily living, pain with passive range of motion, crepitus and joint swelling.  Patient has evidence of subchondral sclerosis, periarticular osteophytes and joint space narrowing by imaging studies.There is no active infection.      Patient Active Problem List   Diagnosis Date Noted  . Bilateral primary osteoarthritis of knee 02/21/2018  . 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 Malignant neoplasm of overlapping sites of right breast in female, estrogen receptor positive ([place]) 11/19/2016       Past Medical History:  Diagnosis Date  . Arthritis    "bone on bone'  left knee  . Asthma   . Breast cancer ([place])   . Colitis   . Dysphagia   . Esophageal stricture   . GERD (gastroesophageal reflux disease)   . History of infertility   . History of radiation therapy 12/31/16- 01/21/17   Right Breast 42.56 Gy in 16 fractions  . Osteopenia   . Pneumonia   . Rosacea   . Ulcerative colitis [facility])          Past Surgical History:  Procedure Laterality Date  . BREAST LUMPECTOMY WITH RADIOACTIVE SEED AND SENTINEL LYMPH NODE BIOPSY Right 11/21/2016   Procedure: BREAST LUMPECTOMY  WITH RADIOACTIVE SEED AND SENTINEL LYMPH NODE BIOPSY;  Surgeon: Excell Seltzer, MD;  Location: [place];  Service: General;  Laterality: Right;  . BUNIONECTOMY WITH HAMMERTOE RECONSTRUCTION Left 07/2014  . CATARACT EXTRACTION Right 10/2015  . ESOPHAGEAL DILATION    . Repair of Chiari Malformation  02/05/13  . SUBOCCIPITAL CRANIECTOMY CERVICAL LAMINECTOMY  02/13/2012   Procedure: SUBOCCIPITAL CRANIECTOMY CERVICAL LAMINECTOMY/DURAPLASTY;  Surgeon: Ophelia Charter, MD;  Location: [place] NEURO ORS;  Service: Neurosurgery;  Laterality: N/A;  Suboccipital craniectomy with upper cervical laminectomy and duraplasty  . TUBAL LIGATION  1987    No current facility-administered medications for this encounter.           Current Outpatient Medications  Medication Sig Dispense Refill Last Dose  . anastrozole (ARIMIDEX) 1 MG tablet TAKE 1 TABLET ONCE DAILY. (Patient taking differently: Take 1 mg by mouth daily. ) 90 tablet 0 Taking  . aspirin 81 MG tablet Take 81 mg by mouth daily.   Taking  . CALCIUM CITRATE-VITAMIN D PO Take 1 tablet by mouth daily.    Taking  . Cholecalciferol (VITAMIN D) 50 MCG (2000 UT) CAPS Take 2,000 Units by mouth daily.    Taking  . diclofenac sodium (VOLTAREN) 1 % GEL Apply 2 g topically 4 (four) times daily. (Patient taking differently: Apply 2 g topically daily. ) 5 Tube 3 Taking  . fluticasone (FLONASE) 50 MCG/ACT nasal spray Place 2 sprays into both nostrils daily as needed for allergies or rhinitis.   Taking  . ibandronate (BONIVA) 150  MG tablet Take 1 tablet (150 mg total) by mouth every 30 (thirty) days. Take in the morning with a full glass of water, on an empty stomach, and do not take anything else by mouth or lie down for the next 30 min.   Taking  . levalbuterol (XOPENEX HFA) 45 MCG/ACT inhaler Inhale two puffs every 4-6 hours if needed for cough or wheeze (Patient taking differently: Inhale 1 puff into the lungs every 6 (six) hours as  needed for wheezing or shortness of breath. ) 1 Inhaler 3 Taking  . mesalamine (LIALDA) 1.2 G EC tablet Take 1.2 g by mouth daily with breakfast.    Taking  . montelukast (SINGULAIR) 10 MG tablet Take one tablet once daily as directed (Patient taking differently: Take 10 mg by mouth daily after supper. ) 30 tablet 11 Taking  . Multiple Vitamins-Iron (MULTIVITAMINS WITH IRON) TABS Take 1 tablet by mouth daily.   Taking  . omeprazole (PRILOSEC) 40 MG capsule Take one capsule twice daily as directed (Patient taking differently: Take 40 mg by mouth daily. ) 60 capsule 5 Taking  . ranitidine (ZANTAC) 300 MG capsule Take 1 capsule (300 mg total) by mouth every evening. (Patient taking differently: Take 300 mg by mouth daily as needed for heartburn. ) 30 capsule 5 Taking  . sodium chloride (OCEAN) 0.65 % nasal spray Place 1 spray into the nose daily as needed for congestion.    Taking  . vitamin E 400 UNIT capsule Take 400 Units by mouth daily.   Taking  . budesonide-formoterol (SYMBICORT) 160-4.5 MCG/ACT inhaler Inhale 2 puffs into the lungs 2 (two) times daily. 1 Inhaler 5 Taking        Allergies  Allergen Reactions  . Sulfa Antibiotics Hives  . Albuterol Other (See Comments)    States she cannot tolerate albuterol without side effects  . Penicillins Hives and Other (See Comments)    Had reaction as a child and as an adult. Had additional reaction but not clear on the specifics    Social History        Tobacco Use  . Smoking status: Never Smoker  . Smokeless tobacco: Never Used  . Tobacco comment: she smoked for a few months in college  Substance Use Topics  . Alcohol use: Yes    Comment: 1 glass of wine monthly    Family History  Problem Relation Age of Onset  . Cancer Mother        Liver- 2003 Lymphoma  . Rheum arthritis Mother   . Stroke Father   . Diabetes Maternal Grandfather        Type 2   . Heart disease Maternal Grandfather   . Hypertension Maternal  Grandmother   . Heart disease Maternal Grandmother   . Osteoporosis Maternal Grandmother   . Anesthesia problems Neg Hx      Review of Systems  Constitutional: Positive foractivity change.  HENT: Negative fortrouble swallowing.  Eyes: Negative forpain.  Respiratory: Negative forshortness of breath.  Cardiovascular: Negative forleg swelling.  Gastrointestinal: Negative forconstipation.  Endocrine: Negative forcold intolerance.  Genitourinary: Negative fordifficulty urinating.  Musculoskeletal: Positive forgait problemand joint swelling.  Skin: Negative forrash.  Allergic/Immunologic: Negative forfood allergies.  Neurological: Negative forweakness.  Hematological:Does not bruise/bleed easily.  Psychiatric/Behavioral: Negative forsleep disturbance.   Objective:  Physical Exam  Constitutional: She is oriented to person, place, and time. She appears well-developed and well-nourished.  HENT:  Head: Normocephalic and atraumatic.  Eyes: Pupils are equal, round, and reactive  to light. Conjunctivae and EOM are normal.  Neck: Neck supple. No thyromegaly present.  No bruits   Cardiovascular: Normal rate, regular rhythm, normal heart sounds and intact distal pulses.  Respiratory: Effort normal. She has wheezes.  GI: Soft. Bowel sounds are normal. She exhibits no distension.  Neurological: She is alert and oriented to person, place, and time.  Skin: Skin is warm and dry.  Psychiatric: She has a normal mood and affect. Her behavior is normal. Judgment and thought content normal.    Vital signs in last 24 hours: Pulse Rate:  [94] 94 (12/18 1109) BP: (131)/(69) 131/69 (12/18 1109) Weight:  [52.2 kg] 52.2 kg (12/18 1109)  Labs:   Estimated body mass index is 22.46 kg/m as calculated from the following:   Height as of 10/15/18: 5' (1.524 m).   Weight as of 10/15/18: 52.2 kg.   Imaging Review Plain radiographs demonstrate moderate degenerative  joint disease of the left knee(s). The overall alignment ismild varus. The bone quality appears to be good for age and reported activity level.   Preoperative templating of the joint replacement has been completed, documented, and submitted to the Operating Room personnel in order to optimize intra-operative equipment management.   Anticipated LOS equal to or greater than 2 midnights due to - Age 41 and older with one or more of the following:             - Obesity             - Expected need for hospital services (PT, OT, Nursing) required for safe   discharge             - Anticipated need for postoperative skilled nursing care or inpatient rehab             - Active co-morbidities: None OR   - Unanticipated findings during/Post Surgery: None  - Patient is a high risk of re-admission due to: None     Assessment/Plan:  End stage arthritis, left knee   The patient history, physical examination, clinical judgment of the provider and imaging studies are consistent with end stage degenerative joint disease of the left knee(s) and total knee arthroplasty is deemed medically necessary. The treatment options including medical management, injection therapy arthroscopy and arthroplasty were discussed at length. The risks and benefits of total knee arthroplasty were presented and reviewed. The risks due to aseptic loosening, infection, stiffness, patella tracking problems, thromboembolic complications and other imponderables were discussed. The patient acknowledged the explanation, agreed to proceed with the plan and consent was signed. Patient is being admitted for inpatient treatment for surgery, pain control, PT, OT, prophylactic antibiotics, VTE prophylaxis, progressive ambulation and ADL's and discharge planning. The patient is planning to be discharged home with home health services   Face-to-face time spent with patient was greater than 40 minutes.  Greater than 50% of the time  was spent in counseling and coordination of care.   Mike Craze [place], [place] [phone]  10/15/2018 12:08 PM

## 2018-10-15 NOTE — H&P (Signed)
TOTAL KNEE ADMISSION H&P  Patient is being admitted for left total knee arthroplasty.  Subjective:  Chief Complaint:left knee pain.  HPI: Cindy Arias, 72 y.o. female, has a history of pain and functional disability in the left knee due to arthritis and has failed non-surgical conservative treatments for greater than 12 weeks to includeNSAID's and/or analgesics, corticosteriod injections, flexibility and strengthening excercises, use of assistive devices and activity modification.  Onset of symptoms was gradual, starting 4 years ago with gradually worsening course since that time. The patient noted no past surgery on the left knee(s).  Patient currently rates pain in the left knee(s) at 7 out of 10 with activity. Patient has night pain, worsening of pain with activity and weight bearing, pain that interferes with activities of daily living, pain with passive range of motion, crepitus and joint swelling.  Patient has evidence of subchondral sclerosis, periarticular osteophytes and joint space narrowing by imaging studies.There is no active infection.  Patient Active Problem List   Diagnosis Date Noted  . Bilateral primary osteoarthritis of knee 02/21/2018  . 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 Malignant neoplasm of overlapping sites of right breast in female, estrogen receptor positive ([place]) 11/19/2016   Past Medical History:  Diagnosis Date  . Arthritis    "bone on bone'  left knee  . Asthma   . Breast cancer ([place])   . Colitis   . Dysphagia   . Esophageal stricture   . GERD (gastroesophageal reflux disease)   . History of infertility   . History of radiation therapy 12/31/16- 01/21/17   Right Breast 42.56 Gy in 16 fractions  . Osteopenia   . Pneumonia   . Rosacea   . Ulcerative colitis [facility])     Past Surgical History:  Procedure Laterality Date  . BREAST LUMPECTOMY WITH RADIOACTIVE SEED AND SENTINEL LYMPH NODE  BIOPSY Right 11/21/2016   Procedure: BREAST LUMPECTOMY WITH RADIOACTIVE SEED AND SENTINEL LYMPH NODE BIOPSY;  Surgeon: Excell Seltzer, MD;  Location: [place];  Service: General;  Laterality: Right;  . BUNIONECTOMY WITH HAMMERTOE RECONSTRUCTION Left 07/2014  . CATARACT EXTRACTION Right 10/2015  . ESOPHAGEAL DILATION    . Repair of Chiari Malformation  02/05/13  . SUBOCCIPITAL CRANIECTOMY CERVICAL LAMINECTOMY  02/13/2012   Procedure: SUBOCCIPITAL CRANIECTOMY CERVICAL LAMINECTOMY/DURAPLASTY;  Surgeon: Ophelia Charter, MD;  Location: [place] NEURO ORS;  Service: Neurosurgery;  Laterality: N/A;  Suboccipital craniectomy with upper cervical laminectomy and duraplasty  . TUBAL LIGATION  1987    No current facility-administered medications for this encounter.    Current Outpatient Medications  Medication Sig Dispense Refill Last Dose  . anastrozole (ARIMIDEX) 1 MG tablet TAKE 1 TABLET ONCE DAILY. (Patient taking differently: Take 1 mg by mouth daily. ) 90 tablet 0 Taking  . aspirin 81 MG tablet Take 81 mg by mouth daily.   Taking  . CALCIUM CITRATE-VITAMIN D PO Take 1 tablet by mouth daily.    Taking  . Cholecalciferol (VITAMIN D) 50 MCG (2000 UT) CAPS Take 2,000 Units by mouth daily.    Taking  . diclofenac sodium (VOLTAREN) 1 % GEL Apply 2 g topically 4 (four) times daily. (Patient taking differently: Apply 2 g topically daily. ) 5 Tube 3 Taking  . fluticasone (FLONASE) 50 MCG/ACT nasal spray Place 2 sprays into both nostrils daily as needed for allergies or rhinitis.   Taking  . ibandronate (BONIVA) 150 MG tablet Take 1 tablet (  150 mg total) by mouth every 30 (thirty) days. Take in the morning with a full glass of water, on an empty stomach, and do not take anything else by mouth or lie down for the next 30 min.   Taking  . levalbuterol (XOPENEX HFA) 45 MCG/ACT inhaler Inhale two puffs every 4-6 hours if needed for cough or wheeze (Patient taking differently: Inhale 1 puff into the  lungs every 6 (six) hours as needed for wheezing or shortness of breath. ) 1 Inhaler 3 Taking  . mesalamine (LIALDA) 1.2 G EC tablet Take 1.2 g by mouth daily with breakfast.    Taking  . montelukast (SINGULAIR) 10 MG tablet Take one tablet once daily as directed (Patient taking differently: Take 10 mg by mouth daily after supper. ) 30 tablet 11 Taking  . Multiple Vitamins-Iron (MULTIVITAMINS WITH IRON) TABS Take 1 tablet by mouth daily.   Taking  . omeprazole (PRILOSEC) 40 MG capsule Take one capsule twice daily as directed (Patient taking differently: Take 40 mg by mouth daily. ) 60 capsule 5 Taking  . ranitidine (ZANTAC) 300 MG capsule Take 1 capsule (300 mg total) by mouth every evening. (Patient taking differently: Take 300 mg by mouth daily as needed for heartburn. ) 30 capsule 5 Taking  . sodium chloride (OCEAN) 0.65 % nasal spray Place 1 spray into the nose daily as needed for congestion.    Taking  . vitamin E 400 UNIT capsule Take 400 Units by mouth daily.   Taking  . budesonide-formoterol (SYMBICORT) 160-4.5 MCG/ACT inhaler Inhale 2 puffs into the lungs 2 (two) times daily. 1 Inhaler 5 Taking   Allergies  Allergen Reactions  . Sulfa Antibiotics Hives  . Albuterol Other (See Comments)    States she cannot tolerate albuterol without side effects  . Penicillins Hives and Other (See Comments)    Had reaction as a child and as an adult. Had additional reaction but not clear on the specifics    Social History   Tobacco Use  . Smoking status: Never Smoker  . Smokeless tobacco: Never Used  . Tobacco comment: she smoked for a few months in college  Substance Use Topics  . Alcohol use: Yes    Comment: 1 glass of wine monthly    Family History  Problem Relation Age of Onset  . Cancer Mother        Liver- 2003 Lymphoma  . Rheum arthritis Mother   . Stroke Father   . Diabetes Maternal Grandfather        Type 2   . Heart disease Maternal Grandfather   . Hypertension Maternal  Grandmother   . Heart disease Maternal Grandmother   . Osteoporosis Maternal Grandmother   . Anesthesia problems Neg Hx      Review of Systems  Constitutional: Positive for activity change.  HENT: Negative for trouble swallowing.   Eyes: Negative for pain.  Respiratory: Negative for shortness of breath.   Cardiovascular: Negative for leg swelling.  Gastrointestinal: Negative for constipation.  Endocrine: Negative for cold intolerance.  Genitourinary: Negative for difficulty urinating.  Musculoskeletal: Positive for gait problem and joint swelling.  Skin: Negative for rash.  Allergic/Immunologic: Negative for food allergies.  Neurological: Negative for weakness.  Hematological: Does not bruise/bleed easily.  Psychiatric/Behavioral: Negative for sleep disturbance.    Objective:  Physical Exam  Constitutional: She is oriented to person, place, and time. She appears well-developed and well-nourished.  HENT:  Head: Normocephalic and atraumatic.  Eyes: Pupils are equal,  round, and reactive to light. Conjunctivae and EOM are normal.  Neck: Neck supple. No thyromegaly present.  No bruits   Cardiovascular: Normal rate, regular rhythm, normal heart sounds and intact distal pulses.  Respiratory: Effort normal. She has wheezes.  GI: Soft. Bowel sounds are normal. She exhibits no distension.  Neurological: She is alert and oriented to person, place, and time.  Skin: Skin is warm and dry.  Psychiatric: She has a normal mood and affect. Her behavior is normal. Judgment and thought content normal.    Vital signs in last 24 hours: Pulse Rate:  [94] 94 (12/18 1109) BP: (131)/(69) 131/69 (12/18 1109) Weight:  [52.2 kg] 52.2 kg (12/18 1109)  Labs:   Estimated body mass index is 22.46 kg/m as calculated from the following:   Height as of 10/15/18: 5' (1.524 m).   Weight as of 10/15/18: 52.2 kg.   Imaging Review Plain radiographs demonstrate moderate degenerative joint disease of  the left knee(s). The overall alignment ismild varus. The bone quality appears to be good for age and reported activity level.   Preoperative templating of the joint replacement has been completed, documented, and submitted to the Operating Room personnel in order to optimize intra-operative equipment management.   Anticipated LOS equal to or greater than 2 midnights due to - Age 74 and older with one or more of the following:  - Obesity  - Expected need for hospital services (PT, OT, Nursing) required for safe  discharge  - Anticipated need for postoperative skilled nursing care or inpatient rehab  - Active co-morbidities: None OR   - Unanticipated findings during/Post Surgery: None  - Patient is a high risk of re-admission due to: None     Assessment/Plan:  End stage arthritis, left knee   The patient history, physical examination, clinical judgment of the provider and imaging studies are consistent with end stage degenerative joint disease of the left knee(s) and total knee arthroplasty is deemed medically necessary. The treatment options including medical management, injection therapy arthroscopy and arthroplasty were discussed at length. The risks and benefits of total knee arthroplasty were presented and reviewed. The risks due to aseptic loosening, infection, stiffness, patella tracking problems, thromboembolic complications and other imponderables were discussed. The patient acknowledged the explanation, agreed to proceed with the plan and consent was signed. Patient is being admitted for inpatient treatment for surgery, pain control, PT, OT, prophylactic antibiotics, VTE prophylaxis, progressive ambulation and ADL's and discharge planning. The patient is planning to be discharged home with home health services   Mike Craze. [place], [place] [phone]  10/15/2018 12:08 PM

## 2018-10-15 NOTE — Progress Notes (Signed)
Subjective:  Chief Complaint:left knee pain.  HPI: Cindy Arias, 72 y.o. female, has a history of pain and functional disability in the left knee due to arthritis and has failed non-surgical conservative treatments for greater than 12 weeks to includeNSAID's and/or analgesics, corticosteriod injections, flexibility and strengthening excercises, use of assistive devices and activity modification.  Onset of symptoms was gradual, starting 4 years ago with gradually worsening course since that time. The patient noted no past surgery on the left knee(s).  Patient currently rates pain in the left knee(s) at 7 out of 10 with activity. Patient has night pain, worsening of pain with activity and weight bearing, pain that interferes with activities of daily living, pain with passive range of motion, crepitus and joint swelling.  Patient has evidence of subchondral sclerosis, periarticular osteophytes and joint space narrowing by imaging studies.There is no active infection.  Patient Active Problem List   Diagnosis Date Noted  . Bilateral primary osteoarthritis of knee 02/21/2018  . 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 Malignant neoplasm of overlapping sites of right breast in female, estrogen receptor positive ([place]) 11/19/2016   Past Medical History:  Diagnosis Date  . Arthritis    "bone on bone'  left knee  . Asthma   . Breast cancer ([place])   . Colitis   . Dysphagia   . Esophageal stricture   . GERD (gastroesophageal reflux disease)   . History of infertility   . History of radiation therapy 12/31/16- 01/21/17   Right Breast 42.56 Gy in 16 fractions  . Osteopenia   . Pneumonia   . Rosacea   . Ulcerative colitis [facility])     Past Surgical History:  Procedure Laterality Date  . BREAST LUMPECTOMY WITH RADIOACTIVE SEED AND SENTINEL LYMPH NODE BIOPSY Right 11/21/2016   Procedure: BREAST LUMPECTOMY WITH RADIOACTIVE SEED AND SENTINEL  LYMPH NODE BIOPSY;  Surgeon: Excell Seltzer, MD;  Location: [place];  Service: General;  Laterality: Right;  . BUNIONECTOMY WITH HAMMERTOE RECONSTRUCTION Left 07/2014  . CATARACT EXTRACTION Right 10/2015  . ESOPHAGEAL DILATION    . Repair of Chiari Malformation  02/05/13  . SUBOCCIPITAL CRANIECTOMY CERVICAL LAMINECTOMY  02/13/2012   Procedure: SUBOCCIPITAL CRANIECTOMY CERVICAL LAMINECTOMY/DURAPLASTY;  Surgeon: Ophelia Charter, MD;  Location: [place] NEURO ORS;  Service: Neurosurgery;  Laterality: N/A;  Suboccipital craniectomy with upper cervical laminectomy and duraplasty  . TUBAL LIGATION  1987    No current facility-administered medications for this encounter.    Current Outpatient Medications  Medication Sig Dispense Refill Last Dose  . anastrozole (ARIMIDEX) 1 MG tablet TAKE 1 TABLET ONCE DAILY. (Patient taking differently: Take 1 mg by mouth daily. ) 90 tablet 0 Taking  . aspirin 81 MG tablet Take 81 mg by mouth daily.   Taking  . CALCIUM CITRATE-VITAMIN D PO Take 1 tablet by mouth daily.    Taking  . Cholecalciferol (VITAMIN D) 50 MCG (2000 UT) CAPS Take 2,000 Units by mouth daily.    Taking  . diclofenac sodium (VOLTAREN) 1 % GEL Apply 2 g topically 4 (four) times daily. (Patient taking differently: Apply 2 g topically daily. ) 5 Tube 3 Taking  . fluticasone (FLONASE) 50 MCG/ACT nasal spray Place 2 sprays into both nostrils daily as needed for allergies or rhinitis.   Taking  . ibandronate (BONIVA) 150 MG tablet Take 1 tablet (150 mg total) by mouth every 30 (thirty) days. Take in the morning with a  full glass of water, on an empty stomach, and do not take anything else by mouth or lie down for the next 30 min.   Taking  . levalbuterol (XOPENEX HFA) 45 MCG/ACT inhaler Inhale two puffs every 4-6 hours if needed for cough or wheeze (Patient taking differently: Inhale 1 puff into the lungs every 6 (six) hours as needed for wheezing or shortness of breath. ) 1 Inhaler 3 Taking   . mesalamine (LIALDA) 1.2 G EC tablet Take 1.2 g by mouth daily with breakfast.    Taking  . montelukast (SINGULAIR) 10 MG tablet Take one tablet once daily as directed (Patient taking differently: Take 10 mg by mouth daily after supper. ) 30 tablet 11 Taking  . Multiple Vitamins-Iron (MULTIVITAMINS WITH IRON) TABS Take 1 tablet by mouth daily.   Taking  . omeprazole (PRILOSEC) 40 MG capsule Take one capsule twice daily as directed (Patient taking differently: Take 40 mg by mouth daily. ) 60 capsule 5 Taking  . ranitidine (ZANTAC) 300 MG capsule Take 1 capsule (300 mg total) by mouth every evening. (Patient taking differently: Take 300 mg by mouth daily as needed for heartburn. ) 30 capsule 5 Taking  . sodium chloride (OCEAN) 0.65 % nasal spray Place 1 spray into the nose daily as needed for congestion.    Taking  . vitamin E 400 UNIT capsule Take 400 Units by mouth daily.   Taking  . budesonide-formoterol (SYMBICORT) 160-4.5 MCG/ACT inhaler Inhale 2 puffs into the lungs 2 (two) times daily. 1 Inhaler 5 Taking   Allergies  Allergen Reactions  . Sulfa Antibiotics Hives  . Albuterol Other (See Comments)    States she cannot tolerate albuterol without side effects  . Penicillins Hives and Other (See Comments)    Had reaction as a child and as an adult. Had additional reaction but not clear on the specifics    Social History   Tobacco Use  . Smoking status: Never Smoker  . Smokeless tobacco: Never Used  . Tobacco comment: she smoked for a few months in college  Substance Use Topics  . Alcohol use: Yes    Comment: 1 glass of wine monthly    Family History  Problem Relation Age of Onset  . Cancer Mother        Liver- 2003 Lymphoma  . Rheum arthritis Mother   . Stroke Father   . Diabetes Maternal Grandfather        Type 2   . Heart disease Maternal Grandfather   . Hypertension Maternal Grandmother   . Heart disease Maternal Grandmother   . Osteoporosis Maternal Grandmother   .  Anesthesia problems Neg Hx      Review of Systems  Constitutional: Positive for activity change.  HENT: Negative for trouble swallowing.   Eyes: Negative for pain.  Respiratory: Negative for shortness of breath.   Cardiovascular: Negative for leg swelling.  Gastrointestinal: Negative for constipation.  Endocrine: Negative for cold intolerance.  Genitourinary: Negative for difficulty urinating.  Musculoskeletal: Positive for gait problem and joint swelling.  Skin: Negative for rash.  Allergic/Immunologic: Negative for food allergies.  Neurological: Negative for weakness.  Hematological: Does not bruise/bleed easily.  Psychiatric/Behavioral: Negative for sleep disturbance.    Objective:  Physical Exam  Constitutional: She is oriented to person, place, and time. She appears well-developed and well-nourished.  HENT:  Head: Normocephalic and atraumatic.  Eyes: Pupils are equal, round, and reactive to light. Conjunctivae and EOM are normal.  Neck: Neck supple. No  thyromegaly present.  No bruits   Cardiovascular: Normal rate, regular rhythm, normal heart sounds and intact distal pulses.  Respiratory: Effort normal. She has wheezes.  GI: Soft. Bowel sounds are normal. She exhibits no distension.  Neurological: She is alert and oriented to person, place, and time.  Skin: Skin is warm and dry.  Psychiatric: She has a normal mood and affect. Her behavior is normal. Judgment and thought content normal.    Vital signs in last 24 hours: Pulse Rate:  [94] 94 (12/18 1109) BP: (131)/(69) 131/69 (12/18 1109) Weight:  [52.2 kg] 52.2 kg (12/18 1109)  Labs:   Estimated body mass index is 22.46 kg/m as calculated from the following:   Height as of 10/15/18: 5' (1.524 m).   Weight as of 10/15/18: 52.2 kg.   Imaging Review Plain radiographs demonstrate moderate degenerative joint disease of the left knee(s). The overall alignment ismild varus. The bone quality appears to be good for age  and reported activity level.   Preoperative templating of the joint replacement has been completed, documented, and submitted to the Operating Room personnel in order to optimize intra-operative equipment management.   Anticipated LOS equal to or greater than 2 midnights due to - Age 54 and older with one or more of the following:  - Obesity  - Expected need for hospital services (PT, OT, Nursing) required for safe  discharge  - Anticipated need for postoperative skilled nursing care or inpatient rehab  - Active co-morbidities: None OR   - Unanticipated findings during/Post Surgery: None  - Patient is a high risk of re-admission due to: None     Assessment/Plan:  End stage arthritis, left knee   The patient history, physical examination, clinical judgment of the provider and imaging studies are consistent with end stage degenerative joint disease of the left knee(s) and total knee arthroplasty is deemed medically necessary. The treatment options including medical management, injection therapy arthroscopy and arthroplasty were discussed at length. The risks and benefits of total knee arthroplasty were presented and reviewed. The risks due to aseptic loosening, infection, stiffness, patella tracking problems, thromboembolic complications and other imponderables were discussed. The patient acknowledged the explanation, agreed to proceed with the plan and consent was signed. Patient is being admitted for inpatient treatment for surgery, pain control, PT, OT, prophylactic antibiotics, VTE prophylaxis, progressive ambulation and ADL's and discharge planning. The patient is planning to be discharged home with home health services   Face-to-face time spent with patient was greater than 40 minutes.  Greater than 50% of the time was spent in counseling and coordination of care.   Mike Craze [place], [place] [phone]  10/15/2018 12:08 PM

## 2018-10-16 ENCOUNTER — Telehealth: Payer: Self-pay | Admitting: *Deleted

## 2018-10-16 MED ORDER — PREDNISONE 20 MG PO TABS: 20.0000 mg | ORAL_TABLET | Freq: Every day | ORAL | 0 refills | Status: AC

## 2018-10-16 MED ORDER — AZITHROMYCIN 500 MG PO TABS: 500.0000 mg | ORAL_TABLET | Freq: Every day | ORAL | 0 refills | Status: AC

## 2018-10-16 NOTE — Telephone Encounter (Signed)
Please have patient use azithromycin 500 mg once a day for 3 days plus prednisone 20 mg once a day for 3 days and see [place] in clinic if she does not do better.

## 2018-10-16 NOTE — Telephone Encounter (Signed)
Called and spoke with patient and she stated that it first started with a raspy throat, then she started having a runny nose and a productive and dry cough. She stated that she is coughing up phlegm that is a brownish green color. Patient stated that she has take sudafed and mucinex with little relief. She has knee replacement surgery 11/04/2018 and needs to be well. Patient is wondering is there anything we could sent in to help her. Please advise

## 2018-10-16 NOTE — Telephone Encounter (Signed)
Called and left a message for patient to call office in regards to this matter. I have sent prescriptions to [facility] and will advise patient when she calls back. Will also need to let her know if she is not feeling better to schedule an appt to be seen.

## 2018-10-16 NOTE — Telephone Encounter (Signed)
Patient called stating she is having some laryngitis go on, and patient would like to know what can she take for it? Please advise [phone]

## 2018-10-16 NOTE — Telephone Encounter (Signed)
Patient called back and was informed of Dr. Bruna Potter recommendation and she will call and make OV if not any better.

## 2018-10-20 ENCOUNTER — Telehealth: Payer: Self-pay | Admitting: Allergy and Immunology

## 2018-10-20 MED ORDER — BENZONATATE 100 MG PO CAPS: 100.0000 mg | ORAL_CAPSULE | Freq: Three times a day (TID) | ORAL | 0 refills | Status: DC | PRN

## 2018-10-20 NOTE — Addendum Note (Signed)
Addended by: Lucrezia Starch I on: 10/20/2018 02:32 PM   Modules accepted: Orders

## 2018-10-20 NOTE — Telephone Encounter (Signed)
Please order tessalon perles 100 mg three times a day as needed for cough #45. Thank you

## 2018-10-20 NOTE — Telephone Encounter (Signed)
I spoke with patient and she is aware that Dr. Neldon Mc is out of the office for the remainder of the week. She is requesting a prescription for the Tessalon perles for her cough. She is on her way to [place] now and will not be available until after the holidays. Please advise on further instructions and thank you.

## 2018-10-20 NOTE — Telephone Encounter (Signed)
Pt called and said that said still has the real bad cough and congestion and is have knee replacement in jan 6 so she need to get better. [facility] [place]. [phone].

## 2018-10-20 NOTE — Telephone Encounter (Signed)
Patient informed of prescription being sent to requested pharmacy.

## 2018-10-24 NOTE — Pre-Procedure Instructions (Signed)
MIIA BLANKS  10/24/2018      [place], [place] [address] [place] [address] Phone: [phone] Fax: [phone]  [id number] [place], [address]Phone: [phone]: [phone]   Your procedure is scheduled on January 7  Report to [facility].  Call this number if you have problems the morning of surgery:  [phone]   Remember:  Do not eat or drink after midnight.      Take these medicines the morning of surgery with A SIP OF WATER  budesonide-formoterol (SYMBICORT)  fluticasone (FLONASE) levalbuterol (XOPENEX HFA montelukast (SINGULAIR) omeprazole (PRILOSEC ranitidine (ZANTAC)      Do not wear jewelry, make-up or nail polish.  Do not wear lotions, powders, or perfumes, or deodorant.  Do not shave 48 hours prior to surgery.   Do not bring valuables to the hospital.  [facility] or [facility]ables.  Contacts, dentures or bridgework may not be worn into surgery.  Leave your suitcase in the car.  After surgery it may be brought to your room.  For patients admitted to the hospital, discharge time will be determined by your treatment team.  Patients discharged the day of surgery will not be allowed to drive home.    Special instructions:   [place]- Preparing For Surgery  Before surgery, you can play an important role. Because skin is not sterile, your skin needs to be as free of germs as possible. You can reduce the number of germs on your skin by washing with CHG (chlorahexidine gluconate) Soap before surgery.  CHG is an antiseptic cleaner which kills germs and bonds with the skin to continue killing germs even after washing.    Oral Hygiene is also important to reduce your risk of infection.  Remember - BRUSH  YOUR TEETH THE MORNING OF SURGERY WITH YOUR REGULAR TOOTHPASTE  Please do not use if you have an allergy to CHG or antibacterial soaps. If your skin becomes reddened/irritated stop using the CHG.  Do not shave (including legs and underarms) for at least 48 hours prior to first CHG shower. It is OK to shave your face.  Please follow these instructions carefully.   1. Shower the NIGHT BEFORE SURGERY and the MORNING OF SURGERY with CHG.   2. If you chose to wash your hair, wash your hair first as usual with your normal shampoo.  3. After you shampoo, rinse your hair and body thoroughly to remove the shampoo.  4. Use CHG as you would any other liquid soap. You can apply CHG directly to the skin and wash gently with a scrungie or a clean washcloth.   5. Apply the CHG Soap to your body ONLY FROM THE NECK DOWN.  Do not use on open wounds or open sores. Avoid contact with your eyes, ears, mouth and genitals (private parts). Wash Face and genitals (private parts)  with your normal soap.  6. Wash thoroughly, paying special attention to the area where your surgery will be performed.  7. Thoroughly rinse your body with warm water from the neck down.  8. DO NOT shower/wash with your normal soap after using and rinsing off the CHG Soap.  9. Pat yourself dry with a CLEAN TOWEL.  10. Wear CLEAN PAJAMAS  to bed the night before surgery, wear comfortable clothes the morning of surgery  11. Place CLEAN SHEETS on your bed the night of your first shower and DO NOT SLEEP WITH PETS.    Day of Surgery:  Do not apply any deodorants/lotions.  Please wear clean clothes to the hospital/surgery center.   Remember to brush your teeth WITH YOUR REGULAR TOOTHPASTE.    Please read over the following fact sheets that you were given.

## 2018-10-27 ENCOUNTER — Encounter (HOSPITAL_COMMUNITY): Payer: Self-pay

## 2018-10-27 ENCOUNTER — Other Ambulatory Visit: Payer: Self-pay

## 2018-10-27 ENCOUNTER — Ambulatory Visit (HOSPITAL_COMMUNITY)
Admission: RE | Admit: 2018-10-27 | Discharge: 2018-10-27 | Disposition: A | Discharge status: Home / Self Care | Payer: Medicare Other | Source: Ambulatory Visit | Attending: Orthopedic Surgery | Admitting: Orthopedic Surgery

## 2018-10-27 ENCOUNTER — Encounter (HOSPITAL_COMMUNITY)
Admission: RE | Admit: 2018-10-27 | Discharge: 2018-10-27 | Disposition: A | Discharge status: Home / Self Care | Payer: Medicare Other | Source: Ambulatory Visit | Attending: Orthopaedic Surgery | Admitting: Orthopaedic Surgery

## 2018-10-27 DIAGNOSIS — Z01818 Encounter for other preprocedural examination: Secondary | ICD-10-CM | POA: Insufficient documentation

## 2018-10-27 HISTORY — DX: Dyspnea, unspecified: R06.00

## 2018-10-27 LAB — URINALYSIS, ROUTINE W REFLEX MICROSCOPIC
Bilirubin Urine: NEGATIVE
Glucose, UA: NEGATIVE mg/dL
Hgb urine dipstick: NEGATIVE
Ketones, ur: NEGATIVE mg/dL
Leukocytes, UA: NEGATIVE
NITRITE: NEGATIVE
PH: 5 (ref 5.0–8.0)
Protein, ur: NEGATIVE mg/dL
Specific Gravity, Urine: 1.026 (ref 1.005–1.030)

## 2018-10-27 LAB — COMPREHENSIVE METABOLIC PANEL
ALT: 23 U/L (ref 0–44)
AST: 24 U/L (ref 15–41)
Albumin: 4.1 g/dL (ref 3.5–5.0)
Alkaline Phosphatase: 60 U/L (ref 38–126)
Anion gap: 10 (ref 5–15)
BUN: 18 mg/dL (ref 8–23)
CO2: 24 mmol/L (ref 22–32)
Calcium: 9.2 mg/dL (ref 8.9–10.3)
Chloride: 105 mmol/L (ref 98–111)
Creatinine, Ser: 0.85 mg/dL (ref 0.44–1.00)
GFR calc non Af Amer: >60 mL/min (ref >60)
Glucose, Bld: 99 mg/dL (ref 70–99)
Potassium: 3.9 mmol/L (ref 3.5–5.1)
Sodium: 139 mmol/L (ref 135–145)
TOTAL PROTEIN: 6.8 g/dL (ref 6.5–8.1)
Total Bilirubin: 0.7 mg/dL (ref 0.3–1.2)

## 2018-10-27 LAB — TYPE AND SCREEN
ABO/RH(D): O POS
Antibody Screen: NEGATIVE

## 2018-10-27 LAB — CBC WITH DIFFERENTIAL/PLATELET
Abs Immature Granulocytes: 0.02 10*3/uL (ref 0.00–0.07)
Basophils Absolute: 0.1 10*3/uL (ref 0.0–0.1)
Basophils Relative: 1 %
Eosinophils Absolute: 0.2 10*3/uL (ref 0.0–0.5)
Eosinophils Relative: 3 %
HEMATOCRIT: 42.6 % (ref 36.0–46.0)
Hemoglobin: 13.3 g/dL (ref 12.0–15.0)
Immature Granulocytes: 0 %
Lymphocytes Relative: 29 %
Lymphs Abs: 2 10*3/uL (ref 0.7–4.0)
MCH: 28.6 pg (ref 26.0–34.0)
MCHC: 31.2 g/dL (ref 30.0–36.0)
MCV: 91.6 fL (ref 80.0–100.0)
MONO ABS: 0.6 10*3/uL (ref 0.1–1.0)
Monocytes Relative: 9 %
Neutro Abs: 4.1 10*3/uL (ref 1.7–7.7)
Neutrophils Relative %: 58 %
Platelets: 326 10*3/uL (ref 150–400)
RBC: 4.65 MIL/uL (ref 3.87–5.11)
RDW: 13 % (ref 11.5–15.5)
WBC: 7 10*3/uL (ref 4.0–10.5)
nRBC: 0 % (ref 0.0–0.2)

## 2018-10-27 LAB — SURGICAL PCR SCREEN
MRSA, PCR: NEGATIVE
Staphylococcus aureus: NEGATIVE

## 2018-10-27 LAB — PROTIME-INR
INR: 1
Prothrombin Time: 13.1 seconds (ref 11.4–15.2)

## 2018-10-27 LAB — APTT: aPTT: 33 seconds (ref 24–36)

## 2018-10-27 NOTE — Progress Notes (Signed)
PCP is Dr. Amedeo Gory  LOV 09/2018 Asthma MD is Dr. Alden Benjamin   Oncologist Dr. Darnell Level Magrinat  LOV 01/2018 Denies murmur, cp, sob.  Did have Echo approx 5 yrs ago prior to her radiation/chemo treatments for breast cancer.

## 2018-10-27 NOTE — Pre-Procedure Instructions (Signed)
Cindy Arias  10/27/2018      [place], [place] [address] Phone: [phone] Fax: [phone]  [id number] [place], [address]Phone: [phone]: [phone]   Your procedure is scheduled on Tuesday, January 7   Report to [facility]             (posted surgery time 7:30a - 9:45a)   Call this number if you have problems the morning of surgery:  [phone]   Remember:  Do not eat any foods or drink any liquids after midnight, Monday.      Take these medicines the morning of surgery with A SIP OF WATER  budesonide-formoterol (SYMBICORT)  fluticasone (FLONASE) levalbuterol (XOPENEX HFA montelukast (SINGULAIR) omeprazole (PRILOSEC ranitidine (ZANTAC)      Do not wear jewelry, make-up or nail polish.  Do not wear lotions, powders, or perfumes, or deodorant.  Do not shave 48 hours prior to surgery.   Do not bring valuables to the hospital.  [facility] or [facility]uables.  Contacts, dentures or bridgework may not be worn into surgery.  Leave your suitcase in the car.  After surgery it may be brought to your room.  For patients admitted to the hospital, discharge time will be determined by your treatment team.     Special instructions:   [place]- Preparing For Surgery  Before surgery, you can play an important role. Because skin is not sterile, your skin needs to be as free of germs as possible. You can reduce the number of germs on your skin by washing with CHG (chlorahexidine gluconate) Soap before surgery.  CHG is an antiseptic cleaner which kills germs and bonds with the skin to continue killing germs even after washing.    Oral Hygiene is also important to reduce your risk of infection.     Remember - BRUSH YOUR TEETH THE MORNING OF SURGERY WITH YOUR REGULAR TOOTHPASTE  Please do not use if you have an allergy to CHG or antibacterial soaps. If your skin becomes reddened/irritated stop using the CHG.  Do not shave (including legs and underarms) for at least 48 hours prior to first CHG shower. It is OK to shave your face.  Please follow these instructions carefully.   1. Shower the NIGHT BEFORE SURGERY and the MORNING OF SURGERY with CHG.   2. If you chose to wash your hair, wash your hair first as usual with your normal shampoo.  3. After you shampoo, rinse your hair and body thoroughly to remove the shampoo.  4. Use CHG as you would any other liquid soap. You can apply CHG directly to the skin and wash gently with a scrungie or a clean washcloth.   5. Apply the CHG Soap to your body ONLY FROM THE NECK DOWN.  Do not use on open wounds or open sores. Avoid contact with your eyes, ears, mouth and genitals (private parts). Wash Face and genitals (private parts)  with your normal soap.  6. Wash thoroughly, paying special attention to the area where your surgery will be performed.  7. Thoroughly rinse your body with warm water from the neck down.  8. DO NOT shower/wash with your normal soap after using and rinsing off the CHG  Soap.  9. Pat yourself dry with a CLEAN TOWEL.  10. Wear CLEAN PAJAMAS to bed the night before surgery, wear comfortable clothes the morning of surgery  11. Place CLEAN SHEETS on your bed the night of your first shower and DO NOT SLEEP WITH PETS.    Day of Surgery:  Do not apply any deodorants/lotions.  Please wear clean clothes to the hospital/surgery center.   Remember to brush your teeth WITH YOUR REGULAR TOOTHPASTE.    Please read over the following fact sheets that you were given.

## 2018-10-28 LAB — URINE CULTURE: Culture: NO GROWTH

## 2018-11-03 MED ORDER — VANCOMYCIN HCL IN DEXTROSE 1-5 GM/200ML-% IV SOLN
1000.0000 mg | INTRAVENOUS | Status: AC
  Administered 2018-11-04: 1000 mg via INTRAVENOUS
  Filled 2018-11-03: qty 200

## 2018-11-03 MED ORDER — TRANEXAMIC ACID 1000 MG/10ML IV SOLN
2000.0000 mg | INTRAVENOUS | Status: AC
  Administered 2018-11-04: 2000 mg via TOPICAL
  Filled 2018-11-03: qty 20

## 2018-11-04 ENCOUNTER — Encounter (HOSPITAL_COMMUNITY)
Admission: AD | Disposition: A | Discharge status: Home Health | Payer: Self-pay | Source: Home / Self Care | Attending: Orthopaedic Surgery

## 2018-11-04 ENCOUNTER — Inpatient Hospital Stay (HOSPITAL_COMMUNITY)
Admission: AD | Admit: 2018-11-04 | Discharge: 2018-11-06 | DRG: 470 | Disposition: A | Discharge status: Home Health | Payer: Medicare Other | Attending: Orthopaedic Surgery | Admitting: Orthopaedic Surgery

## 2018-11-04 ENCOUNTER — Ambulatory Visit (HOSPITAL_COMMUNITY): Payer: Medicare Other | Admitting: Certified Registered Nurse Anesthetist

## 2018-11-04 ENCOUNTER — Other Ambulatory Visit: Payer: Self-pay

## 2018-11-04 ENCOUNTER — Encounter (HOSPITAL_COMMUNITY): Payer: Self-pay | Admitting: General Practice

## 2018-11-04 DIAGNOSIS — Z8262 Family history of osteoporosis: Secondary | ICD-10-CM

## 2018-11-04 DIAGNOSIS — Z170 Estrogen receptor positive status [ER+]: Secondary | ICD-10-CM

## 2018-11-04 DIAGNOSIS — Z79899 Other long term (current) drug therapy: Secondary | ICD-10-CM | POA: Diagnosis not present

## 2018-11-04 DIAGNOSIS — J45909 Unspecified asthma, uncomplicated: Secondary | ICD-10-CM | POA: Diagnosis present

## 2018-11-04 DIAGNOSIS — Z8261 Family history of arthritis: Secondary | ICD-10-CM | POA: Diagnosis not present

## 2018-11-04 DIAGNOSIS — K219 Gastro-esophageal reflux disease without esophagitis: Secondary | ICD-10-CM | POA: Diagnosis present

## 2018-11-04 DIAGNOSIS — Z882 Allergy status to sulfonamides status: Secondary | ICD-10-CM | POA: Diagnosis not present

## 2018-11-04 DIAGNOSIS — Z888 Allergy status to other drugs, medicaments and biological substances status: Secondary | ICD-10-CM

## 2018-11-04 DIAGNOSIS — M8580 Other specified disorders of bone density and structure, unspecified site: Secondary | ICD-10-CM | POA: Diagnosis not present

## 2018-11-04 DIAGNOSIS — M170 Bilateral primary osteoarthritis of knee: Principal | ICD-10-CM | POA: Diagnosis present

## 2018-11-04 DIAGNOSIS — Z8249 Family history of ischemic heart disease and other diseases of the circulatory system: Secondary | ICD-10-CM

## 2018-11-04 DIAGNOSIS — Z880 Allergy status to penicillin: Secondary | ICD-10-CM

## 2018-11-04 DIAGNOSIS — K5190 Ulcerative colitis, unspecified, without complications: Secondary | ICD-10-CM | POA: Diagnosis present

## 2018-11-04 DIAGNOSIS — M1712 Unilateral primary osteoarthritis, left knee: Secondary | ICD-10-CM | POA: Diagnosis present

## 2018-11-04 DIAGNOSIS — Z923 Personal history of irradiation: Secondary | ICD-10-CM

## 2018-11-04 DIAGNOSIS — Z7951 Long term (current) use of inhaled steroids: Secondary | ICD-10-CM | POA: Diagnosis not present

## 2018-11-04 DIAGNOSIS — Z9841 Cataract extraction status, right eye: Secondary | ICD-10-CM

## 2018-11-04 DIAGNOSIS — Z79811 Long term (current) use of aromatase inhibitors: Secondary | ICD-10-CM

## 2018-11-04 DIAGNOSIS — Z833 Family history of diabetes mellitus: Secondary | ICD-10-CM

## 2018-11-04 DIAGNOSIS — Z7982 Long term (current) use of aspirin: Secondary | ICD-10-CM | POA: Diagnosis not present

## 2018-11-04 DIAGNOSIS — Z853 Personal history of malignant neoplasm of breast: Secondary | ICD-10-CM

## 2018-11-04 DIAGNOSIS — Z791 Long term (current) use of non-steroidal anti-inflammatories (NSAID): Secondary | ICD-10-CM

## 2018-11-04 DIAGNOSIS — Z823 Family history of stroke: Secondary | ICD-10-CM

## 2018-11-04 DIAGNOSIS — Z96652 Presence of left artificial knee joint: Secondary | ICD-10-CM

## 2018-11-04 HISTORY — PX: TOTAL KNEE ARTHROPLASTY: SHX125

## 2018-11-04 SURGERY — ARTHROPLASTY, KNEE, TOTAL
Anesthesia: Spinal | Laterality: Left

## 2018-11-04 MED ORDER — ALUM & MAG HYDROXIDE-SIMETH 200-200-20 MG/5ML PO SUSP: 30.0000 mL | ORAL | Status: DC | PRN

## 2018-11-04 MED ORDER — PROPOFOL 500 MG/50ML IV EMUL
INTRAVENOUS | Status: DC | PRN
  Administered 2018-11-04: 65 ug/kg/min via INTRAVENOUS

## 2018-11-04 MED ORDER — VANCOMYCIN HCL IN DEXTROSE 1-5 GM/200ML-% IV SOLN
1000.0000 mg | Freq: Two times a day (BID) | INTRAVENOUS | Status: AC
  Administered 2018-11-04: 1000 mg via INTRAVENOUS
  Filled 2018-11-04: qty 200

## 2018-11-04 MED ORDER — FLEET ENEMA 7-19 GM/118ML RE ENEM: 1.0000 | ENEMA | Freq: Once | RECTAL | Status: DC | PRN

## 2018-11-04 MED ORDER — HYDROMORPHONE HCL 1 MG/ML IJ SOLN: 0.2500 mg | INTRAMUSCULAR | Status: DC | PRN

## 2018-11-04 MED ORDER — MONTELUKAST SODIUM 10 MG PO TABS
10.0000 mg | ORAL_TABLET | Freq: Every day | ORAL | Status: DC
  Administered 2018-11-04 – 2018-11-05 (×2): 10 mg via ORAL
  Filled 2018-11-04 (×2): qty 1

## 2018-11-04 MED ORDER — CHLORHEXIDINE GLUCONATE 4 % EX LIQD: 60.0000 mL | Freq: Once | CUTANEOUS | Status: DC

## 2018-11-04 MED ORDER — PROMETHAZINE HCL 25 MG/ML IJ SOLN: 6.2500 mg | INTRAMUSCULAR | Status: DC | PRN

## 2018-11-04 MED ORDER — DOCUSATE SODIUM 100 MG PO CAPS
100.0000 mg | ORAL_CAPSULE | Freq: Two times a day (BID) | ORAL | Status: DC
  Administered 2018-11-04 – 2018-11-06 (×4): 100 mg via ORAL
  Filled 2018-11-04 (×4): qty 1

## 2018-11-04 MED ORDER — METHOCARBAMOL 500 MG PO TABS
ORAL_TABLET | ORAL | Status: AC
  Filled 2018-11-04: qty 1

## 2018-11-04 MED ORDER — MOMETASONE FURO-FORMOTEROL FUM 200-5 MCG/ACT IN AERO
2.0000 | INHALATION_SPRAY | Freq: Two times a day (BID) | RESPIRATORY_TRACT | Status: DC
  Administered 2018-11-05 – 2018-11-06 (×2): 2 via RESPIRATORY_TRACT
  Filled 2018-11-04: qty 8.8

## 2018-11-04 MED ORDER — OXYCODONE HCL 5 MG PO TABS
ORAL_TABLET | ORAL | Status: AC
  Filled 2018-11-04: qty 1

## 2018-11-04 MED ORDER — FAMOTIDINE 20 MG PO TABS
20.0000 mg | ORAL_TABLET | Freq: Two times a day (BID) | ORAL | Status: DC
  Administered 2018-11-04 – 2018-11-06 (×4): 20 mg via ORAL
  Filled 2018-11-04 (×4): qty 1

## 2018-11-04 MED ORDER — PROPOFOL 10 MG/ML IV BOLUS
INTRAVENOUS | Status: AC
  Filled 2018-11-04: qty 20

## 2018-11-04 MED ORDER — BUPIVACAINE-EPINEPHRINE (PF) 0.25% -1:200000 IJ SOLN
INTRAMUSCULAR | Status: AC
  Filled 2018-11-04: qty 30

## 2018-11-04 MED ORDER — OXYCODONE HCL 5 MG PO TABS
5.0000 mg | ORAL_TABLET | ORAL | Status: DC | PRN
  Administered 2018-11-04: 5 mg via ORAL
  Administered 2018-11-05: 10 mg via ORAL
  Administered 2018-11-05: 5 mg via ORAL
  Administered 2018-11-06 (×2): 10 mg via ORAL
  Filled 2018-11-04 (×4): qty 2
  Filled 2018-11-04: qty 1

## 2018-11-04 MED ORDER — PHENOL 1.4 % MT LIQD: 1.0000 | OROMUCOSAL | Status: DC | PRN

## 2018-11-04 MED ORDER — KETOROLAC TROMETHAMINE 15 MG/ML IJ SOLN
7.5000 mg | Freq: Four times a day (QID) | INTRAMUSCULAR | Status: AC
  Administered 2018-11-04 – 2018-11-05 (×4): 7.5 mg via INTRAVENOUS
  Filled 2018-11-04 (×3): qty 1

## 2018-11-04 MED ORDER — FLUTICASONE PROPIONATE 50 MCG/ACT NA SUSP: 2.0000 | Freq: Every day | NASAL | Status: DC | PRN

## 2018-11-04 MED ORDER — PANTOPRAZOLE SODIUM 40 MG PO TBEC
80.0000 mg | DELAYED_RELEASE_TABLET | Freq: Every day | ORAL | Status: DC
  Administered 2018-11-05 – 2018-11-06 (×2): 80 mg via ORAL
  Filled 2018-11-04 (×3): qty 2

## 2018-11-04 MED ORDER — METOCLOPRAMIDE HCL 5 MG PO TABS
5.0000 mg | ORAL_TABLET | Freq: Three times a day (TID) | ORAL | Status: DC | PRN
  Administered 2018-11-05: 10 mg via ORAL
  Filled 2018-11-04: qty 2

## 2018-11-04 MED ORDER — ANASTROZOLE 1 MG PO TABS
1.0000 mg | ORAL_TABLET | Freq: Every day | ORAL | Status: DC
  Administered 2018-11-05 – 2018-11-06 (×2): 1 mg via ORAL
  Filled 2018-11-04 (×2): qty 1

## 2018-11-04 MED ORDER — TAB-A-VITE/IRON PO TABS
1.0000 | ORAL_TABLET | Freq: Every day | ORAL | Status: DC
  Administered 2018-11-05 – 2018-11-06 (×2): 1 via ORAL
  Filled 2018-11-04 (×2): qty 1

## 2018-11-04 MED ORDER — OXYCODONE HCL 5 MG/5ML PO SOLN: 5.0000 mg | Freq: Once | ORAL | Status: AC | PRN

## 2018-11-04 MED ORDER — LEVALBUTEROL HCL 0.63 MG/3ML IN NEBU: 0.6300 mg | INHALATION_SOLUTION | Freq: Four times a day (QID) | RESPIRATORY_TRACT | Status: DC | PRN

## 2018-11-04 MED ORDER — BUPIVACAINE HCL (PF) 0.75 % IJ SOLN
INTRAMUSCULAR | Status: DC | PRN
  Administered 2018-11-04: 1.8 mL via INTRATHECAL

## 2018-11-04 MED ORDER — ONDANSETRON HCL 4 MG PO TABS
4.0000 mg | ORAL_TABLET | Freq: Four times a day (QID) | ORAL | Status: DC | PRN
  Administered 2018-11-05: 4 mg via ORAL
  Filled 2018-11-04: qty 1

## 2018-11-04 MED ORDER — ACETAMINOPHEN 10 MG/ML IV SOLN
1000.0000 mg | Freq: Once | INTRAVENOUS | Status: AC
  Administered 2018-11-04: 1000 mg via INTRAVENOUS
  Filled 2018-11-04: qty 100

## 2018-11-04 MED ORDER — SODIUM CHLORIDE 0.9 % IV SOLN: INTRAVENOUS | Status: DC

## 2018-11-04 MED ORDER — MESALAMINE 1.2 G PO TBEC
1.2000 g | DELAYED_RELEASE_TABLET | Freq: Every day | ORAL | Status: DC
  Administered 2018-11-05 – 2018-11-06 (×2): 1.2 g via ORAL
  Filled 2018-11-04 (×2): qty 1

## 2018-11-04 MED ORDER — METOCLOPRAMIDE HCL 5 MG/ML IJ SOLN: 5.0000 mg | Freq: Three times a day (TID) | INTRAMUSCULAR | Status: DC | PRN

## 2018-11-04 MED ORDER — FENTANYL CITRATE (PF) 250 MCG/5ML IJ SOLN
INTRAMUSCULAR | Status: AC
  Filled 2018-11-04: qty 5

## 2018-11-04 MED ORDER — VITAMIN E 180 MG (400 UNIT) PO CAPS
400.0000 [IU] | ORAL_CAPSULE | Freq: Every day | ORAL | Status: DC
  Administered 2018-11-05 – 2018-11-06 (×2): 400 [IU] via ORAL
  Filled 2018-11-04 (×2): qty 1

## 2018-11-04 MED ORDER — BISACODYL 10 MG RE SUPP: 10.0000 mg | Freq: Every day | RECTAL | Status: DC | PRN

## 2018-11-04 MED ORDER — LEVALBUTEROL TARTRATE 45 MCG/ACT IN AERO: 1.0000 | INHALATION_SPRAY | Freq: Four times a day (QID) | RESPIRATORY_TRACT | Status: DC | PRN

## 2018-11-04 MED ORDER — METHOCARBAMOL 500 MG PO TABS
500.0000 mg | ORAL_TABLET | Freq: Four times a day (QID) | ORAL | Status: DC | PRN
  Administered 2018-11-04 – 2018-11-05 (×3): 500 mg via ORAL
  Filled 2018-11-04 (×2): qty 1

## 2018-11-04 MED ORDER — SODIUM CHLORIDE 0.9 % IV SOLN
INTRAVENOUS | Status: DC | PRN
  Administered 2018-11-04: 20 ug/min via INTRAVENOUS

## 2018-11-04 MED ORDER — DIPHENHYDRAMINE HCL 12.5 MG/5ML PO ELIX: 12.5000 mg | ORAL_SOLUTION | ORAL | Status: DC | PRN

## 2018-11-04 MED ORDER — SODIUM CHLORIDE 0.9 % IR SOLN
Status: DC | PRN
  Administered 2018-11-04: 3000 mL

## 2018-11-04 MED ORDER — BENZONATATE 100 MG PO CAPS: 100.0000 mg | ORAL_CAPSULE | Freq: Three times a day (TID) | ORAL | Status: DC | PRN

## 2018-11-04 MED ORDER — ASPIRIN EC 325 MG PO TBEC
325.0000 mg | DELAYED_RELEASE_TABLET | Freq: Every day | ORAL | Status: DC
  Administered 2018-11-05 – 2018-11-06 (×2): 325 mg via ORAL
  Filled 2018-11-04 (×2): qty 1

## 2018-11-04 MED ORDER — FENTANYL CITRATE (PF) 250 MCG/5ML IJ SOLN
INTRAMUSCULAR | Status: DC | PRN
  Administered 2018-11-04: 75 ug via INTRAVENOUS

## 2018-11-04 MED ORDER — OXYCODONE HCL 5 MG PO TABS
5.0000 mg | ORAL_TABLET | Freq: Once | ORAL | Status: AC | PRN
  Administered 2018-11-04: 5 mg via ORAL

## 2018-11-04 MED ORDER — METHOCARBAMOL 1000 MG/10ML IJ SOLN
500.0000 mg | Freq: Four times a day (QID) | INTRAVENOUS | Status: DC | PRN
  Filled 2018-11-04: qty 5

## 2018-11-04 MED ORDER — MAGNESIUM HYDROXIDE 400 MG/5ML PO SUSP: 30.0000 mL | Freq: Every day | ORAL | Status: DC | PRN

## 2018-11-04 MED ORDER — ONDANSETRON HCL 4 MG/2ML IJ SOLN
INTRAMUSCULAR | Status: DC | PRN
  Administered 2018-11-04: 4 mg via INTRAVENOUS

## 2018-11-04 MED ORDER — HYDROMORPHONE HCL 1 MG/ML IJ SOLN: 0.5000 mg | INTRAMUSCULAR | Status: DC | PRN

## 2018-11-04 MED ORDER — LACTATED RINGERS IV SOLN
INTRAVENOUS | Status: DC
  Administered 2018-11-04 (×2): via INTRAVENOUS

## 2018-11-04 MED ORDER — ROPIVACAINE HCL 5 MG/ML IJ SOLN
INTRAMUSCULAR | Status: DC | PRN
  Administered 2018-11-04: 20 mL via PERINEURAL

## 2018-11-04 MED ORDER — MENTHOL 3 MG MT LOZG: 1.0000 | LOZENGE | OROMUCOSAL | Status: DC | PRN

## 2018-11-04 MED ORDER — MIDAZOLAM HCL 2 MG/2ML IJ SOLN
INTRAMUSCULAR | Status: DC | PRN
  Administered 2018-11-04 (×2): 1 mg via INTRAVENOUS

## 2018-11-04 MED ORDER — MIDAZOLAM HCL 2 MG/2ML IJ SOLN
INTRAMUSCULAR | Status: AC
  Filled 2018-11-04: qty 2

## 2018-11-04 MED ORDER — 0.9 % SODIUM CHLORIDE (POUR BTL) OPTIME
TOPICAL | Status: DC | PRN
  Administered 2018-11-04: 1000 mL

## 2018-11-04 MED ORDER — KETOROLAC TROMETHAMINE 15 MG/ML IJ SOLN
INTRAMUSCULAR | Status: AC
  Filled 2018-11-04: qty 1

## 2018-11-04 MED ORDER — ONDANSETRON HCL 4 MG/2ML IJ SOLN: 4.0000 mg | Freq: Four times a day (QID) | INTRAMUSCULAR | Status: DC | PRN

## 2018-11-04 MED ORDER — SODIUM CHLORIDE 0.9 % IV SOLN
75.0000 mL/h | INTRAVENOUS | Status: DC
  Administered 2018-11-04: 75 mL/h via INTRAVENOUS

## 2018-11-04 SURGICAL SUPPLY — 73 items
BAG DECANTER FOR FLEXI CONT (MISCELLANEOUS) ×3 IMPLANT
BANDAGE ESMARK 6X9 LF (GAUZE/BANDAGES/DRESSINGS) ×1 IMPLANT
BLADE SAGITTAL 25.0X1.19X90 (BLADE) ×2 IMPLANT
BLADE SAGITTAL 25.0X1.19X90MM (BLADE) ×1
BNDG CMPR 9X6 STRL LF SNTH (GAUZE/BANDAGES/DRESSINGS) ×1
BNDG ESMARK 6X9 LF (GAUZE/BANDAGES/DRESSINGS) ×3
BOWL SMART MIX CTS (DISPOSABLE) ×3 IMPLANT
BRUSH SCRUB EZ  4% CHG (MISCELLANEOUS) ×2
BRUSH SCRUB EZ 4% CHG (MISCELLANEOUS) IMPLANT
CEMENT HV SMART SET (Cement) ×6 IMPLANT
CEMENT TIBIA MBT SIZE 2 (Knees) IMPLANT
COVER SURGICAL LIGHT HANDLE (MISCELLANEOUS) ×3 IMPLANT
COVER WAND RF STERILE (DRAPES) ×3 IMPLANT
CUFF TOURNIQUET SINGLE 34IN LL (TOURNIQUET CUFF) ×3 IMPLANT
CUFF TOURNIQUET SINGLE 44IN (TOURNIQUET CUFF) IMPLANT
DECANTER SPIKE VIAL GLASS SM (MISCELLANEOUS) ×3 IMPLANT
DRAPE EXTREMITY T 121X128X90 (DISPOSABLE) ×3 IMPLANT
DRAPE HALF SHEET 40X57 (DRAPES) ×6 IMPLANT
DRSG ADAPTIC 3X8 NADH LF (GAUZE/BANDAGES/DRESSINGS) ×3 IMPLANT
DRSG PAD ABDOMINAL 8X10 ST (GAUZE/BANDAGES/DRESSINGS) ×6 IMPLANT
DURAPREP 26ML APPLICATOR (WOUND CARE) ×6 IMPLANT
ELECT CAUTERY BLADE 6.4 (BLADE) ×3 IMPLANT
ELECT REM PT RETURN 9FT ADLT (ELECTROSURGICAL) ×3
ELECTRODE REM PT RTRN 9FT ADLT (ELECTROSURGICAL) ×1 IMPLANT
EVACUATOR 1/8 PVC DRAIN (DRAIN) IMPLANT
FACESHIELD WRAPAROUND (MASK) ×6 IMPLANT
FACESHIELD WRAPAROUND OR TEAM (MASK) ×2 IMPLANT
FEMUR CMTD LCS LEFT MEDIUM (Knees) IMPLANT
FEMUR LCS LEFT MEDIUM (Knees) ×3 IMPLANT
GAUZE SPONGE 4X4 12PLY STRL (GAUZE/BANDAGES/DRESSINGS) ×3 IMPLANT
GLOVE BIO SURGEON STRL SZ7.5 (GLOVE) ×6 IMPLANT
GLOVE BIOGEL PI IND STRL 7.0 (GLOVE) IMPLANT
GLOVE BIOGEL PI IND STRL 8 (GLOVE) ×1 IMPLANT
GLOVE BIOGEL PI IND STRL 8.5 (GLOVE) ×1 IMPLANT
GLOVE BIOGEL PI INDICATOR 7.0 (GLOVE) ×2
GLOVE BIOGEL PI INDICATOR 8 (GLOVE) ×2
GLOVE BIOGEL PI INDICATOR 8.5 (GLOVE) ×2
GLOVE ECLIPSE 8.0 STRL XLNG CF (GLOVE) ×6 IMPLANT
GLOVE ECLIPSE 8.5 STRL (GLOVE) ×6 IMPLANT
GOWN STRL REUS W/ TWL LRG LVL3 (GOWN DISPOSABLE) ×2 IMPLANT
GOWN STRL REUS W/TWL 2XL LVL3 (GOWN DISPOSABLE) ×3 IMPLANT
GOWN STRL REUS W/TWL LRG LVL3 (GOWN DISPOSABLE) ×6
HANDPIECE INTERPULSE COAX TIP (DISPOSABLE) ×3
INSERT DEPUY TIBIAL (Knees) ×2 IMPLANT
KIT BASIN OR (CUSTOM PROCEDURE TRAY) ×3 IMPLANT
KIT TURNOVER KIT B (KITS) ×3 IMPLANT
MANIFOLD NEPTUNE II (INSTRUMENTS) ×3 IMPLANT
NEEDLE 22X1 1/2 (OR ONLY) (NEEDLE) ×3 IMPLANT
NS IRRIG 1000ML POUR BTL (IV SOLUTION) ×3 IMPLANT
PACK TOTAL JOINT (CUSTOM PROCEDURE TRAY) ×3 IMPLANT
PAD ARMBOARD 7.5X6 YLW CONV (MISCELLANEOUS) ×6 IMPLANT
PAD CAST 4YDX4 CTTN HI CHSV (CAST SUPPLIES) ×1 IMPLANT
PADDING CAST COTTON 4X4 STRL (CAST SUPPLIES) ×3
PADDING CAST COTTON 6X4 STRL (CAST SUPPLIES) ×3 IMPLANT
PATELLA LCS 3PEG MED (Knees) ×2 IMPLANT
PIN STEINMAN FIXATION KNEE (PIN) ×2 IMPLANT
SET HNDPC FAN SPRY TIP SCT (DISPOSABLE) ×1 IMPLANT
STAPLER VISISTAT 35W (STAPLE) ×3 IMPLANT
SUCTION FRAZIER HANDLE 10FR (MISCELLANEOUS) ×2
SUCTION TUBE FRAZIER 10FR DISP (MISCELLANEOUS) ×1 IMPLANT
SURGIFLO W/THROMBIN 8M KIT (HEMOSTASIS) IMPLANT
SUT BONE WAX W31G (SUTURE) ×3 IMPLANT
SUT ETHIBOND NAB CT1 #1 30IN (SUTURE) ×6 IMPLANT
SUT MNCRL AB 3-0 PS2 18 (SUTURE) ×3 IMPLANT
SUT VIC AB 0 CT1 27 (SUTURE) ×3
SUT VIC AB 0 CT1 27XBRD ANBCTR (SUTURE) ×1 IMPLANT
SYR CONTROL 10ML LL (SYRINGE) IMPLANT
TIBIA MBT CEMENT SIZE 2 (Knees) ×3 IMPLANT
TOWEL OR 17X24 6PK STRL BLUE (TOWEL DISPOSABLE) ×3 IMPLANT
TOWEL OR 17X26 10 PK STRL BLUE (TOWEL DISPOSABLE) ×3 IMPLANT
TRAY FOLEY BAG SILVER LF 16FR (SET/KITS/TRAYS/PACK) ×3 IMPLANT
TUBING BULK SUCTION (MISCELLANEOUS) ×2 IMPLANT
WRAP KNEE MAXI GEL POST OP (GAUZE/BANDAGES/DRESSINGS) ×3 IMPLANT

## 2018-11-04 NOTE — Progress Notes (Signed)
Orthopedic Tech Progress Note Patient Details:  Cindy Arias Feb 05, 1946 [id number]  CPM Left Knee CPM Left Knee: On Left Knee Flexion (Degrees): 90 Left Knee Extension (Degrees): 0  Post Interventions Patient Tolerated: Well Instructions Provided: Care of device  [place] Pink 11/04/2018, 11:10 AM

## 2018-11-04 NOTE — Anesthesia Postprocedure Evaluation (Signed)
Anesthesia Post Note  Patient: Cindy Arias  Procedure(s) Performed: LEFT TOTAL KNEE ARTHROPLASTY (Left )     Patient location during evaluation: PACU Anesthesia Type: Spinal Level of consciousness: oriented and awake and alert Pain management: pain level controlled Vital Signs Assessment: post-procedure vital signs reviewed and stable Respiratory status: spontaneous breathing and respiratory function stable Cardiovascular status: blood pressure returned to baseline and stable Postop Assessment: no headache, no backache and no apparent nausea or vomiting Anesthetic complications: no    Last Vitals:  Vitals:   11/04/18 1050 11/04/18 1120  BP: 106/70 127/77  Pulse: 63 74  Resp: 12 18  Temp:    SpO2: 100% 100%    Last Pain:  Vitals:   11/04/18 1006  TempSrc:   PainSc: 0-No pain                 Lynda Rainwater

## 2018-11-04 NOTE — Anesthesia Preprocedure Evaluation (Signed)
Anesthesia Evaluation  Patient identified by MRN, date of birth, ID band Patient awake    Reviewed: Allergy & Precautions, NPO status , Patient's Chart, lab work & pertinent test results  Airway Mallampati: II  TM Distance: >3 FB Neck ROM: Full    Dental no notable dental hx.    Pulmonary asthma ,    Pulmonary exam normal breath sounds clear to auscultation       Cardiovascular negative cardio ROS Normal cardiovascular exam Rhythm:Regular Rate:Normal     Neuro/Psych negative neurological ROS  negative psych ROS   GI/Hepatic Neg liver ROS, GERD  Medicated and Controlled,  Endo/Other  negative endocrine ROS  Renal/GU negative Renal ROS  negative genitourinary   Musculoskeletal  (+) Arthritis , Osteoarthritis,    Abdominal   Peds negative pediatric ROS (+)  Hematology negative hematology ROS (+)   Anesthesia Other Findings   Reproductive/Obstetrics negative OB ROS                             Anesthesia Physical  Anesthesia Plan  ASA: II  Anesthesia Plan: Spinal   Post-op Pain Management:  Regional for Post-op pain   Induction: Intravenous  PONV Risk Score and Plan: 2 and Ondansetron and Midazolam  Airway Management Planned: Simple Face Mask  Additional Equipment:   Intra-op Plan:   Post-operative Plan:   Informed Consent: I have reviewed the patients History and Physical, chart, labs and discussed the procedure including the risks, benefits and alternatives for the proposed anesthesia with the patient or authorized representative who has indicated his/her understanding and acceptance.   Dental advisory given  Plan Discussed with: CRNA  Anesthesia Plan Comments:         Anesthesia Quick Evaluation

## 2018-11-04 NOTE — Op Note (Signed)
PATIENT ID:      Cindy Arias  MRN:     [id number] DOB/AGE:    [birth year] / 74 y.o.       OPERATIVE REPORT    DATE OF PROCEDURE:  11/04/2018       PREOPERATIVE DIAGNOSIS:end stage   left knee osteoarthritis                                                       Estimated body mass index is 22.62 kg/m as calculated from the following:   Height as of this encounter: 5' (1.524 m).   Weight as of this encounter: 52.5 kg.     POSTOPERATIVE DIAGNOSIS: end stage  left knee osteoarthritis                                                                     Estimated body mass index is 22.62 kg/m as calculated from the following:   Height as of this encounter: 5' (1.524 m).   Weight as of this encounter: 52.5 kg.     PROCEDURE:  Procedure(s): LEFT TOTAL KNEE ARTHROPLASTY      SURGEON:  Joni Fears, MD    ASSISTANT:   Biagio Borg, PA-C   (Present and scrubbed throughout the case, critical for assistance with exposure, retraction, instrumentation, and closure.)          ANESTHESIA: regional, spinal and IV sedation     DRAINS: none :      TOURNIQUET TIME:  Total Tourniquet Time Documented: Thigh (Left) - 73 minutes Total: Thigh (Left) - 73 minutes     COMPLICATIONS:  None   CONDITION:  stable  PROCEDURE IN DETAIL: [place] 11/04/2018, 9:37 AM

## 2018-11-04 NOTE — Anesthesia Procedure Notes (Signed)
Anesthesia Regional Block: Adductor canal block   Pre-Anesthetic Checklist: ,, timeout performed, Correct Patient, Correct Site, Correct Laterality, Correct Procedure, Correct Position, site marked, Risks and benefits discussed,  Surgical consent,  Pre-op evaluation,  At surgeon's request and post-op pain management  Laterality: Left  Prep: chloraprep       Needles:  Injection technique: Single-shot  Needle Type: Stimiplex     Needle Length: 9cm  Needle Gauge: 21     Additional Needles:   Procedures:,,,, ultrasound used (permanent image in chart),,,,  Narrative:  Start time: 11/04/2018 6:57 AM End time: 11/04/2018 7:02 AM Injection made incrementally with aspirations every 5 mL.  Performed by: Personally  Anesthesiologist: Lynda Rainwater, MD

## 2018-11-04 NOTE — Transfer of Care (Signed)
Immediate Anesthesia Transfer of Care Note  Patient: Cindy Arias  Procedure(s) Performed: LEFT TOTAL KNEE ARTHROPLASTY (Left )  Patient Location: PACU  Anesthesia Type:MAC, Regional and Spinal  Level of Consciousness: awake, alert  and oriented  Airway & Oxygen Therapy: Patient Spontanous Breathing  Post-op Assessment: Report given to RN, Post -op Vital signs reviewed and stable and Patient moving all extremities  Post vital signs: Reviewed and stable  Last Vitals:  Vitals Value Taken Time  BP 136/95 11/04/2018  9:51 AM  Temp 36.4 C 11/04/2018  9:51 AM  Pulse 68 11/04/2018  9:57 AM  Resp 12 11/04/2018  9:57 AM  SpO2 100 % 11/04/2018  9:57 AM  Vitals shown include unvalidated device data.  Last Pain:  Vitals:   11/04/18 8403  TempSrc: Oral  PainSc: 7       Patients Stated Pain Goal: 3 (75/43/60 6770)  Complications: No apparent anesthesia complications

## 2018-11-04 NOTE — Anesthesia Procedure Notes (Signed)
Procedure Name: MAC Date/Time: 11/04/2018 7:30 AM Performed by: Leonor Liv, CRNA Pre-anesthesia Checklist: Patient identified, Emergency Drugs available, Suction available, Patient being monitored and Timeout performed Patient Re-evaluated:Patient Re-evaluated prior to induction Oxygen Delivery Method: Nasal cannula Placement Confirmation: positive ETCO2 Dental Injury: Teeth and Oropharynx as per pre-operative assessment

## 2018-11-04 NOTE — H&P (Signed)
The recent History & Physical has been reviewed. I have personally examined the patient today. There is no interval change to the documented History & Physical. The patient would like to proceed with the procedure.  Garald Balding 11/04/2018,  7:14 AM

## 2018-11-04 NOTE — Op Note (Signed)
NAME: Cindy Arias, Cindy Arias MEDICAL RECORD UU:[id number] ACCOUNT [id number] DATE OF BIRTH:1946/09/03 FACILITY: [facility] LOCATION: [facility] PHYSICIAN:Carah Barrientes Sharlotte Alamo, MD  OPERATIVE REPORT  DATE OF PROCEDURE:  11/04/2018  PREOPERATIVE DIAGNOSIS:  End-stage osteoarthritis, left knee.  POSTOPERATIVE DIAGNOSIS:  End-stage osteoarthritis, left knee.  PROCEDURE:  Left total knee replacement.  SURGEON:  Joni Fears, MD  ASSISTANT:  Biagio Borg, PA-C.  ANESTHESIA:  Spinal with adductor canal block and IV sedation.  COMPLICATIONS:  None.  COMPONENTS: 1.  DePuy LCS medium femoral condyle. 2.  A #2 rotating keeled tibial tray with a 12.5 mm bridging bearing.   3.  A metal backed 3-peg rotating patella.  Components were secured with polymethyl methacrylate.  DESCRIPTION OF PROCEDURE:  The patient was met with her family in the holding area and identified the left knee as appropriate operative site and marked it accordingly.  Anesthesia performed an adductor canal block.  The patient was then transported to room 6.  Anesthesia performed spinal anesthetic without difficulty.  The patient was then placed supine on the operating table with IV sedation.  The left lower extremity was placed in a thigh tourniquet.  The left  lower extremity was then prepped with chlorhexidine scrub and DuraPrep x2 from the tourniquet to the tips of the toes.  Sterile draping was performed.  A gram of vancomycin was administered IV.  Timeout was called.  Left lower extremity was then elevated with the Esmarch applied and tourniquet at 325 mmHg.  A midline longitudinal incision was then made centered about the patella extending from the superior pouch to the tibial tubercle.  Via sharp dissection, incision was carried down to subcutaneous tissue.  A medial parapatellar incision was then made  through the deep capsule.  The joint was entered.  The patella was easily everted 180 degrees laterally.  There was a  small clear yellow joint effusion.  There was mild synovitis.  Synovectomy was performed.  There were moderately large osteophytes along the medial and lateral femoral condyle.  These were removed for measuring purposes.  I measured a medium femoral component.  I was able to place the knee at the neutral alignment from a varus position.  First bony cut was then made transversely in the proximal tibia with a 7 degree angle of declination using the external tibial guide.  After each bony cut of the tibia and femur, I checked my alignment with the external guide.  Subsequent cuts were then made on the femur using the medium femoral jig.  Flexion and extension gaps were perfectly symmetrical at 12.5.  The distal femoral cut was then made in 4 degrees of valgus.  Laminar spreader was then inserted along the medial and lateral compartment.  Medial and lateral menisci and ACL and PCL were removed with the Bovie.  Osteophytes were removed from the  posterior femoral condyle with a 3/4 inch curved osteotome.  There were no loose bodies.  I again checked my alignment and my flexion and extension gaps without evidence of instability with the 12.5 mm gap.  Finishing cuts were then made on the femur for tapering purposes and for the center holes.  Retractor was then placed about the tibia and was advanced anteriorly.  I measured a #2 tibial tray.  This was pinned in place.  A center hole was made followed by the keeled cut.  With the trial tibial jig in place, the 12.5 mm bridging bearing was then applied followed by the medium femoral component.  The entire  construct was reduced and through a full range of motion, there was no opening with varus or valgus stress.  MCL and  LCL remained intact throughout the procedure.  Patella was then prepared by removing approximately 9 mm of bone leaving 12 mm of patella thickness.  Three holes were then made with the patella guide.  Trial patella was inserted and  through a full range of motion remained perfectly stable.  The trial components were then removed.  The joint was copiously irrigated with saline solution.  The final components were then impacted with polymethyl methacrylate.  We initially applied the #2 tibial tray followed by the 12.5 mm bridging bearing and the medium femoral condyle.  Any extraneous methacrylate was removed.  We had full extension and  no opening with varus or valgus stress.  With the knee in extension, the patella was then applied with methacrylate and compressed with a patellar clamp.  Any extraneous methacrylate was removed from its periphery.  At approximately 16 minutes, the methacrylate had hardened and matured.  During this time, we infiltrated the joint with 0.25% Marcaine with epinephrine.  Tourniquet was deflated at 72 minutes.  Any bleeding was controlled with the Bovie and with topical tranexamic acid under compression for over 5 minutes.  With a nice dry field, I closed the deep capsule with a running #1 Ethibond.  The superficial  capsule and subcutaneous were closed with Vicryl and 3-0 Monocryl.  Skin closed with skin clips.  A sterile bulky dressing was applied followed by the patient's support stocking.  Nursing staff inserted a Foley catheter with clear urine.  The patient was then transported to the postanesthesia recovery room without complications.  TN/NUANCE  D:11/04/2018 T:11/04/2018 JOB:[id number]

## 2018-11-04 NOTE — Evaluation (Addendum)
Physical Therapy Evaluation Patient Details Name: Cindy Arias MRN: [id number] DOB: [birth year] Today's Date: 11/04/2018   History of Present Illness  Pt is a 73 y.o. female s/p elective L TKA on 11/04/18. PMH includes asthma, osteopenia, breast CA.  Clinical Impression  Pt presents with an overall decrease in functional mobility secondary to above. PTA, pt indep and lives with supportive husband. Educ on precautions, positioning, therex, and importance of mobility. Today, pt able to transfer and ambulate with min guard and RW; good ability to maintain LLE 50% PWB precautions. Pt would benefit from continued acute PT services to maximize functional mobility and independence prior to d/c with HHPT services.     Follow Up Recommendations Follow surgeon's recommendation for DC plan and follow-up therapies;Supervision for mobility/OOB    Equipment Recommendations  None recommended by PT    Recommendations for Other Services       Precautions / Restrictions Precautions Precautions: Knee;Fall Precaution Booklet Issued: Yes (comment) Precaution Comments: Verbally reviewed precautions/positioning Restrictions Weight Bearing Restrictions: Yes LLE Weight Bearing: Partial weight bearing LLE Partial Weight Bearing Percentage or Pounds: 50      Mobility  Bed Mobility Overal bed mobility: Modified Independent                Transfers Overall transfer level: Needs assistance Equipment used: Rolling walker (2 wheeled) Transfers: Sit to/from Stand Sit to Stand: Min guard         General transfer comment: Cues for correct hand placement as pt attempting to pull on RW; pt encouraged to still use LLE as initially standing with RLE alone  Ambulation/Gait Ambulation/Gait assistance: Min guard Gait Distance (Feet): 60 Feet Assistive device: Rolling walker (2 wheeled) Gait Pattern/deviations: Step-to pattern;Decreased weight shift to left;Antalgic Gait velocity: Decreased Gait  velocity interpretation: <1.8 ft/sec, indicate of risk for recurrent falls General Gait Details: Slow, antalgic amb with RW and intermittent min guard for balance; pt with good recall of 50% PWB precautions. Will benefit from youth RW next visit  Stairs            Wheelchair Mobility    Modified Rankin (Stroke Patients Only)       Balance Overall balance assessment: Needs assistance   Sitting balance-Leahy Scale: Good       Standing balance-Leahy Scale: Fair Standing balance comment: Can static stand without UE support                             Pertinent Vitals/Pain Pain Assessment: 0-10 Pain Score: 6  Pain Location: L posterior knee Pain Descriptors / Indicators: Sore;Discomfort Pain Intervention(s): Premedicated before session;Monitored during session;Limited activity within patient's tolerance    Home Living Family/patient expects to be discharged to:: Private residence Living Arrangements: Spouse/significant other Available Help at Discharge: Family;Available 24 hours/day Type of Home: House Home Access: Stairs to enter   [employer] of Steps: 2 threshold steps Home Layout: Two level;Able to live on main level with bedroom/bathroom Home Equipment: Gilford Rile - 2 wheels;Bedside commode;Grab bars - tub/shower;Hand held shower head      Prior Function Level of Independence: Independent               Hand Dominance        Extremity/Trunk Assessment   Upper Extremity Assessment Upper Extremity Assessment: Overall WFL for tasks assessed    Lower Extremity Assessment Lower Extremity Assessment: LLE deficits/detail LLE Deficits / Details: s/p L TKA; knee flex/ext at least 3/5  LLE: Unable to fully assess due to immobilization;Unable to fully assess due to pain       Communication   Communication: No difficulties  Cognition Arousal/Alertness: Awake/alert Behavior During Therapy: WFL for tasks assessed/performed Overall  Cognitive Status: Within Functional Limits for tasks assessed                                 General Comments: [facility] for basic tasks. Some decreased attention noted when attempting to multitask, likely baseline cognition.       General Comments General comments (skin integrity, edema, etc.): Husband present and supportive    Exercises General Exercises - Lower Extremity Long Arc Quad: AROM;Left;Seated Heel Slides: AROM;Left;Seated Straight Leg Raises: AROM;Left;Seated(partial range)   Assessment/Plan    PT Assessment Patient needs continued PT services  PT Problem List Decreased strength;Decreased range of motion;Decreased activity tolerance;Decreased balance;Decreased mobility;Decreased knowledge of use of DME;Decreased knowledge of precautions;Pain       PT Treatment Interventions DME instruction;Gait training;Stair training;Functional mobility training;Therapeutic activities;Therapeutic exercise;Balance training;Patient/family education    PT Goals (Current goals can be found in the Care Plan section)  Acute Rehab PT Goals Patient Stated Goal: Return home PT Goal Formulation: With patient Time For Goal Achievement: 11/18/18 Potential to Achieve Goals: Good    Frequency 7X/week   Barriers to discharge        Co-evaluation               AM-PAC PT "6 Clicks" Mobility  Outcome Measure Help needed turning from your back to your side while in a flat bed without using bedrails?: None Help needed moving from lying on your back to sitting on the side of a flat bed without using bedrails?: None Help needed moving to and from a bed to a chair (including a wheelchair)?: A Little Help needed standing up from a chair using your arms (e.g., wheelchair or bedside chair)?: A Little Help needed to walk in hospital room?: A Little Help needed climbing 3-5 steps with a railing? : A Little 6 Click Score: 20    End of Session Equipment Utilized During Treatment: Gait  belt Activity Tolerance: Patient tolerated treatment well Patient left: in chair;with call bell/phone within reach;with family/visitor present Nurse Communication: Mobility status PT Visit Diagnosis: Other abnormalities of gait and mobility (R26.89);Pain Pain - Right/Left: Left Pain - part of body: Knee    Time: 6438-3818 PT Time Calculation (min) (ACUTE ONLY): 25 min   Charges:   PT Evaluation $PT Eval Low Complexity: 1 Low PT Treatments $Gait Training: 8-22 mins      Mabeline Caras, PT, DPT Acute Rehabilitation Services  Pager [phone] Office [place] 11/04/2018, 5:36 PM

## 2018-11-04 NOTE — Anesthesia Procedure Notes (Signed)
Spinal  Patient location during procedure: OR Start time: 11/04/2018 7:29 AM End time: 11/04/2018 7:34 AM Staffing Anesthesiologist: Lynda Rainwater, MD Performed: anesthesiologist  Preanesthetic Checklist Completed: patient identified, site marked, surgical consent, pre-op evaluation, timeout performed, IV checked, risks and benefits discussed and monitors and equipment checked Spinal Block Patient position: sitting Prep: ChloraPrep Patient monitoring: heart rate, continuous pulse ox and blood pressure Approach: midline Location: L3-4 Injection technique: single-shot Needle Needle type: Pencan  Needle gauge: 24 G

## 2018-11-05 ENCOUNTER — Encounter (HOSPITAL_COMMUNITY): Payer: Self-pay | Admitting: Orthopaedic Surgery

## 2018-11-05 ENCOUNTER — Other Ambulatory Visit (INDEPENDENT_AMBULATORY_CARE_PROVIDER_SITE_OTHER): Payer: Self-pay | Admitting: Orthopaedic Surgery

## 2018-11-05 DIAGNOSIS — M1712 Unilateral primary osteoarthritis, left knee: Secondary | ICD-10-CM | POA: Diagnosis not present

## 2018-11-05 DIAGNOSIS — M170 Bilateral primary osteoarthritis of knee: Secondary | ICD-10-CM | POA: Diagnosis not present

## 2018-11-05 LAB — CBC
HCT: 28.7 % — ABNORMAL LOW (ref 36.0–46.0)
HEMOGLOBIN: 9 g/dL — AB (ref 12.0–15.0)
MCH: 27.8 pg (ref 26.0–34.0)
MCHC: 31.4 g/dL (ref 30.0–36.0)
MCV: 88.6 fL (ref 80.0–100.0)
Platelets: 220 10*3/uL (ref 150–400)
RBC: 3.24 MIL/uL — ABNORMAL LOW (ref 3.87–5.11)
RDW: 13.2 % (ref 11.5–15.5)
WBC: 6.6 10*3/uL (ref 4.0–10.5)
nRBC: 0 % (ref 0.0–0.2)

## 2018-11-05 LAB — BASIC METABOLIC PANEL
Anion gap: 6 (ref 5–15)
BUN: 16 mg/dL (ref 8–23)
CHLORIDE: 108 mmol/L (ref 98–111)
CO2: 22 mmol/L (ref 22–32)
Calcium: 8.1 mg/dL — ABNORMAL LOW (ref 8.9–10.3)
Creatinine, Ser: 0.74 mg/dL (ref 0.44–1.00)
GFR calc Af Amer: >60 mL/min (ref >60)
GFR calc non Af Amer: >60 mL/min (ref >60)
Glucose, Bld: 109 mg/dL — ABNORMAL HIGH (ref 70–99)
Potassium: 4 mmol/L (ref 3.5–5.1)
SODIUM: 136 mmol/L (ref 135–145)

## 2018-11-05 NOTE — Progress Notes (Signed)
Physical Therapy Treatment Patient Details Name: Cindy Arias MRN: [id number] DOB: 1946/04/21 Today's Date: 11/05/2018    History of Present Illness Pt is a 73 y.o. female s/p elective L TKA on 11/04/18. PMH includes asthma, osteopenia, breast CA.    PT Comments    Patient seen for mobility progression. Excellent motivation to participate. Education on bed positioning as well as WEB status. Cueing for safety and sequencing with gait with good carryover. Primary limiting factor pain. Will plan to progress gait and exercise at next visit.    Follow Up Recommendations  Follow surgeon's recommendation for DC plan and follow-up therapies;Supervision for mobility/OOB     Equipment Recommendations  None recommended by PT    Recommendations for Other Services       Precautions / Restrictions Precautions Precautions: Knee;Fall Precaution Comments: Verbally reviewed precautions/positioning Restrictions Weight Bearing Restrictions: Yes LLE Weight Bearing: Partial weight bearing LLE Partial Weight Bearing Percentage or Pounds: 50    Mobility  Bed Mobility Overal bed mobility: Modified Independent                Transfers Overall transfer level: Needs assistance Equipment used: Rolling walker (2 wheeled) Transfers: Sit to/from Stand Sit to Stand: Min guard         General transfer comment: min guard from bedside and toilet; cueing for hand placement for safety  Ambulation/Gait Ambulation/Gait assistance: Min guard Gait Distance (Feet): 60 Feet Assistive device: Rolling walker (2 wheeled) Gait Pattern/deviations: Step-to pattern;Decreased weight shift to left;Decreased stance time - left;Antalgic Gait velocity: Decreased   General Gait Details: slow antalgic gait; consistent verbal cueing for sequencing with gait adn how to offload to maintain 50% weight bearing; use of youth RW   Stairs             Wheelchair Mobility    Modified Rankin (Stroke  Patients Only)       Balance Overall balance assessment: Needs assistance Sitting-balance support: No upper extremity supported;Feet supported Sitting balance-Leahy Scale: Good     Standing balance support: Bilateral upper extremity supported;During functional activity Standing balance-Leahy Scale: Fair Standing balance comment: Can static stand without UE support                            Cognition Arousal/Alertness: Awake/alert Behavior During Therapy: WFL for tasks assessed/performed Overall Cognitive Status: Within Functional Limits for tasks assessed                                        Exercises      General Comments        Pertinent Vitals/Pain Pain Assessment: 0-10 Pain Score: 9  Pain Location: L knee with mobility Pain Descriptors / Indicators: Aching;Discomfort;Guarding Pain Intervention(s): Limited activity within patient's tolerance;Monitored during session;Repositioned;Premedicated before session    Home Living                      Prior Function            PT Goals (current goals can now be found in the care plan section) Acute Rehab PT Goals Patient Stated Goal: Return home PT Goal Formulation: With patient Time For Goal Achievement: 11/18/18 Potential to Achieve Goals: Good Progress towards PT goals: Progressing toward goals    Frequency    7X/week      PT Plan Current plan  remains appropriate    Co-evaluation              AM-PAC PT "6 Clicks" Mobility   Outcome Measure  Help needed turning from your back to your side while in a flat bed without using bedrails?: None Help needed moving from lying on your back to sitting on the side of a flat bed without using bedrails?: None Help needed moving to and from a bed to a chair (including a wheelchair)?: A Little Help needed standing up from a chair using your arms (e.g., wheelchair or bedside chair)?: A Little Help needed to walk in hospital  room?: A Little Help needed climbing 3-5 steps with a railing? : A Little 6 Click Score: 20    End of Session Equipment Utilized During Treatment: Gait belt Activity Tolerance: Patient tolerated treatment well Patient left: in chair;with call bell/phone within reach;with family/visitor present Nurse Communication: Mobility status PT Visit Diagnosis: Other abnormalities of gait and mobility (R26.89);Pain Pain - Right/Left: Left Pain - part of body: Knee     Time: 5176-1607 PT Time Calculation (min) (ACUTE ONLY): 26 min  Charges:  $Gait Training: 8-22 mins $Therapeutic Activity: 8-22 mins                      Lanney Gins, PT, DPT Supplemental Physical Therapist 11/05/18 11:23 AM Pager: [phone] Office: [phone]

## 2018-11-05 NOTE — Plan of Care (Signed)
  Problem: Activity: Goal: Range of joint motion will improve Outcome: Progressing   Problem: Pain Management: Goal: Pain level will decrease with appropriate interventions Outcome: Progressing

## 2018-11-05 NOTE — Progress Notes (Signed)
Physical Therapy Treatment Patient Details Name: Cindy Arias MRN: [id number] DOB: May 30, 1946 Today's Date: 11/05/2018    History of Present Illness Pt is a 73 y.o. female s/p elective L TKA on 11/04/18. PMH includes asthma, osteopenia, breast CA.    PT Comments    Patient received supine in bed - reports some nausea which she has been medicated for. Patient instructed on HEP for TKA with good tolerance to activity. Patient ambulating short distance in room to door and then to restroom with min guard for safety. Will continue to follow.     Follow Up Recommendations  Follow surgeon's recommendation for DC plan and follow-up therapies;Supervision for mobility/OOB     Equipment Recommendations  None recommended by PT    Recommendations for Other Services       Precautions / Restrictions Precautions Precautions: Knee;Fall Precaution Comments: Verbally reviewed precautions/positioning Restrictions Weight Bearing Restrictions: Yes LLE Weight Bearing: Partial weight bearing LLE Partial Weight Bearing Percentage or Pounds: 50    Mobility  Bed Mobility Overal bed mobility: Modified Independent                Transfers Overall transfer level: Needs assistance Equipment used: Rolling walker (2 wheeled) Transfers: Sit to/from Stand Sit to Stand: Min guard         General transfer comment: min guard for safety and immediate standing balance - no LOB  Ambulation/Gait Ambulation/Gait assistance: Min guard Gait Distance (Feet): 40 Feet Assistive device: Rolling walker (2 wheeled) Gait Pattern/deviations: Step-to pattern;Decreased weight shift to left;Decreased stance time - left;Antalgic Gait velocity: Decreased   General Gait Details: gait limited due to nausea; patient able to maintain WB status    Stairs             Wheelchair Mobility    Modified Rankin (Stroke Patients Only)       Balance Overall balance assessment: Needs  assistance Sitting-balance support: No upper extremity supported;Feet supported Sitting balance-Leahy Scale: Good     Standing balance support: Bilateral upper extremity supported;During functional activity Standing balance-Leahy Scale: Fair Standing balance comment: Can static stand without UE support                            Cognition Arousal/Alertness: Awake/alert Behavior During Therapy: WFL for tasks assessed/performed Overall Cognitive Status: Within Functional Limits for tasks assessed                                        Exercises Total Joint Exercises Ankle Circles/Pumps: AROM;Both;15 reps Quad Sets: AROM;Both;10 reps Gluteal Sets: AROM;Both;10 reps Heel Slides: AAROM;Left;10 reps Hip ABduction/ADduction: AAROM;Left;10 reps Straight Leg Raises: AAROM;Left;10 reps    General Comments        Pertinent Vitals/Pain Pain Assessment: 0-10 Pain Score: 4  Pain Location: L knee with mobility Pain Descriptors / Indicators: Aching;Discomfort;Guarding Pain Intervention(s): Limited activity within patient's tolerance;Monitored during session;Repositioned    Home Living                      Prior Function            PT Goals (current goals can now be found in the care plan section) Acute Rehab PT Goals Patient Stated Goal: Return home PT Goal Formulation: With patient Time For Goal Achievement: 11/18/18 Potential to Achieve Goals: Good Progress towards PT goals: Progressing toward  goals    Frequency    7X/week      PT Plan Current plan remains appropriate    Co-evaluation              AM-PAC PT "6 Clicks" Mobility   Outcome Measure  Help needed turning from your back to your side while in a flat bed without using bedrails?: None Help needed moving from lying on your back to sitting on the side of a flat bed without using bedrails?: None Help needed moving to and from a bed to a chair (including a  wheelchair)?: A Little Help needed standing up from a chair using your arms (e.g., wheelchair or bedside chair)?: A Little Help needed to walk in hospital room?: A Little Help needed climbing 3-5 steps with a railing? : A Little 6 Click Score: 20    End of Session Equipment Utilized During Treatment: Gait belt Activity Tolerance: Patient tolerated treatment well Patient left: in chair;with call bell/phone within reach;with family/visitor present Nurse Communication: Mobility status PT Visit Diagnosis: Other abnormalities of gait and mobility (R26.89);Pain Pain - Right/Left: Left Pain - part of body: Knee     Time: 3875-6433 PT Time Calculation (min) (ACUTE ONLY): 33 min  Charges:  $Gait Training: 8-22 mins $Therapeutic Exercise: 8-22 mins        Lanney Gins, PT, DPT Supplemental Physical Therapist 11/05/18 2:45 PM Pager: [phone] Office: [phone]

## 2018-11-05 NOTE — Progress Notes (Signed)
Patient would like to keep her foley in until after doing her morning CPM.

## 2018-11-05 NOTE — Progress Notes (Signed)
PATIENT ID: Cindy Arias        MRN:  [id number]          DOB/AGE: [birth year] / 73 y.o.    Joni Fears, MD   Biagio Borg, PA-C [address] [place], [place]  [address]                             [phone]   PROGRESS NOTE  Subjective:  negative for Chest Pain  negative for Shortness of Breath  negative for Nausea/Vomiting   negative for Calf Pain    Tolerating Diet: yes         Patient reports pain as mild.     Comfortable night-"staying ahead of the pain"  Objective: Vital signs in last 24 hours:   @IPVITALS @    Intake/Output from previous day:   No intake/output data recorded.   Intake/Output this shift:   @RRIOCURSHIFT @      LABORATORY DATA: Recent Labs    11/05/18 0153  WBC 6.6  HGB 9.0*  HCT 28.7*  PLT 220   Recent Labs    11/05/18 0153  NA 136  K 4.0  CL 108  CO2 22  BUN 16  CREATININE 0.74  GLUCOSE 109*  CALCIUM 8.1*   Lab Results  Component Value Date   INR 1.00 10/27/2018    Recent Radiographic Studies :  Dg Chest 2 View  Result Date: 10/27/2018 CLINICAL DATA:  73 year old female preoperative study for knee surgery. EXAM: CHEST - 2 VIEW COMPARISON:  Chest radiographs 02/06/2012. FINDINGS: Incidental bilateral nipple shadows. Lung volumes and mediastinal contours are within normal limits. Visualized tracheal air column is within normal limits. Both lungs appear clear. No pneumothorax or pleural effusion. No acute osseous abnormality identified. Negative visible bowel gas pattern. IMPRESSION: Negative.  No acute cardiopulmonary abnormality. Electronically Signed   By: Genevie Ann M.D.   On: 10/27/2018 11:14     Examination:  General appearance: alert, cooperative and no distress  Wound Exam: clean, dry, intact   Drainage:  None: wound tissue dry  Motor Exam: EHL, FHL, Anterior Tibial and Posterior Tibial Intact  Sensory Exam: Superficial Peroneal, Deep Peroneal and Tibial normal  Vascular Exam: Normal  Assessment:      @RRDAYSPOSTSURGERY @  @RRSURGERY @  ADDITIONAL DIAGNOSIS:  Active Problems:   * No active hospital problems. *  Acute Blood Loss Anemia-asymptomatic, monitor   Plan: Physical Therapy as ordered Partial Weight Bearing @ 50% (PWB)  DVT Prophylaxis:  Aspirin and TED hose  DISCHARGE PLAN: Home  DISCHARGE NEEDS: HHPT, CPM, Walker and 3-in-1 comode seat  OOB with PT, D/C foley, dressing change in am and discharge         Biagio Borg, Hershal Coria [place]  11/05/2018 7:14 AM

## 2018-11-06 DIAGNOSIS — M1712 Unilateral primary osteoarthritis, left knee: Secondary | ICD-10-CM | POA: Diagnosis not present

## 2018-11-06 DIAGNOSIS — M170 Bilateral primary osteoarthritis of knee: Secondary | ICD-10-CM | POA: Diagnosis not present

## 2018-11-06 LAB — CBC
HCT: 29.9 % — ABNORMAL LOW (ref 36.0–46.0)
HEMOGLOBIN: 9.7 g/dL — AB (ref 12.0–15.0)
MCH: 28.7 pg (ref 26.0–34.0)
MCHC: 32.4 g/dL (ref 30.0–36.0)
MCV: 88.5 fL (ref 80.0–100.0)
Platelets: 240 10*3/uL (ref 150–400)
RBC: 3.38 MIL/uL — ABNORMAL LOW (ref 3.87–5.11)
RDW: 13.2 % (ref 11.5–15.5)
WBC: 7.4 10*3/uL (ref 4.0–10.5)
nRBC: 0 % (ref 0.0–0.2)

## 2018-11-06 LAB — BASIC METABOLIC PANEL
Anion gap: 7 (ref 5–15)
BUN: 10 mg/dL (ref 8–23)
CHLORIDE: 103 mmol/L (ref 98–111)
CO2: 25 mmol/L (ref 22–32)
Calcium: 8.2 mg/dL — ABNORMAL LOW (ref 8.9–10.3)
Creatinine, Ser: 0.59 mg/dL (ref 0.44–1.00)
GFR calc Af Amer: >60 mL/min (ref >60)
GFR calc non Af Amer: >60 mL/min (ref >60)
Glucose, Bld: 135 mg/dL — ABNORMAL HIGH (ref 70–99)
Potassium: 3.6 mmol/L (ref 3.5–5.1)
Sodium: 135 mmol/L (ref 135–145)

## 2018-11-06 MED ORDER — OXYCODONE HCL 5 MG PO TABS: 5.0000 mg | ORAL_TABLET | ORAL | 0 refills | Status: DC | PRN

## 2018-11-06 MED ORDER — ASPIRIN 325 MG PO TBEC: 325.0000 mg | DELAYED_RELEASE_TABLET | Freq: Every day | ORAL | 0 refills | Status: DC

## 2018-11-06 MED ORDER — METHOCARBAMOL 500 MG PO TABS: 500.0000 mg | ORAL_TABLET | Freq: Three times a day (TID) | ORAL | 0 refills | Status: DC | PRN

## 2018-11-06 NOTE — Discharge Summary (Signed)
Joni Fears, MD   Biagio Borg, PA-C [address], [place], [place]  [address]                             [phone]  PATIENT ID: Cindy Arias        MRN:  [id number]          DOB/AGE: [birth year] / 73 y.o.    DISCHARGE SUMMARY  ADMISSION DATE:    11/04/2018 DISCHARGE DATE:   11/06/2018   ADMISSION DIAGNOSIS: S/P total knee arthroplasty, left [Z96.652]    DISCHARGE DIAGNOSIS:  left knee osteoarthritis    ADDITIONAL DIAGNOSIS: Principal Problem:   Primary osteoarthritis of left knee Active Problems:   S/P total knee arthroplasty, left  Past Medical History:  Diagnosis Date  . Arthritis    "bone on bone'  left knee  . Asthma    LAST FLARE UP 3 YRS AGO  . Breast cancer ([place])    RIGHT BREAST  . Colitis   . Dysphagia   . Dyspnea    NONE IN 3 YRS  . Esophageal stricture   . GERD (gastroesophageal reflux disease)   . History of infertility   . History of radiation therapy 12/31/16- 01/21/17   Right Breast 42.56 Gy in 16 fractions  . Osteopenia   . Pneumonia   . Rosacea   . Ulcerative colitis ([place])     PROCEDURE: Procedure(s): LEFT TOTAL KNEE ARTHROPLASTY  on 11/04/2018  CONSULTS: none    HISTORY: Rozell Searing, 73 y.o. female,has a history of pain and functional disability in the leftknee due to arthritisand has failed non-surgical conservative treatments for greater than 12 weeks to includeNSAID's and/or analgesics, corticosteriod injections, flexibility and strengthening excercises, use of assistive devices and activity modification. Onset of symptoms was gradual,starting4years ago withgradually worseningcourse since that time.The patient noted no past surgery on the left knee(s).Patient currently rates pain in the leftknee(s)at 7out of 10 with activity. Patient has night pain, worsening of pain with activity and weight bearing, pain that interferes with activities of daily living, pain with passive range of motion, crepitus and joint  swelling. Patient has evidence of subchondral sclerosis, periarticular osteophytes and joint space narrowing by imaging studies.There is no active infection.   HOSPITAL COURSE:  TYSHEENA GINZBURG is a 73 y.o. admitted on 11/04/2018 and found to have a diagnosis of left knee osteoarthritis.  After appropriate laboratory studies were obtained  they were taken to the operating room on 11/04/2018 and underwent  Procedure(s): LEFT TOTAL KNEE ARTHROPLASTY  .   They were given perioperative antibiotics:  Anti-infectives (From admission, onward)   Start     Dose/Rate Route Frequency Ordered Stop   11/04/18 1900  vancomycin (VANCOCIN) IVPB 1000 mg/200 mL premix     1,000 mg 200 mL/hr over 60 Minutes Intravenous Every 12 hours 11/04/18 1459 11/04/18 2100   11/04/18 0600  vancomycin (VANCOCIN) IVPB 1000 mg/200 mL premix     1,000 mg 200 mL/hr over 60 Minutes Intravenous To ShortStay Surgical 11/03/18 1442 11/04/18 0742    .  Tolerated the procedure well.  Placed with a foley intraoperatively.     Toradol was given post op.  POD #1, allowed out of bed to a chair.  PT for ambulation and exercise program.  Foley D/C'd in morning.  IV saline locked.  O2 discontionued.  POD #2, continued PT and ambulation.  .  The remainder of the hospital course was  dedicated to ambulation and strengthening.   The patient was discharged on 2 Days Post-Op in  Stable condition.  Blood products given:none  DIAGNOSTIC STUDIES: Recent vital signs:  Patient Vitals for the past 24 hrs:  BP Temp Temp src Pulse Resp SpO2  11/06/18 0834 - - - - - 97 %  11/06/18 0300 (!) 126/59 99 F (37.2 C) Oral (!) 101 - 97 %  11/05/18 1947 137/68 98.6 F (37 C) Oral 85 - 100 %  11/05/18 1500 (!) 141/74 98.7 F (37.1 C) Oral 82 18 98 %       Recent laboratory studies: Recent Labs    11/05/18 0153 11/06/18 0124  WBC 6.6 7.4  HGB 9.0* 9.7*  HCT 28.7* 29.9*  PLT 220 240   Recent Labs    11/05/18 0153 11/06/18 0124  NA 136  135  K 4.0 3.6  CL 108 103  CO2 22 25  BUN 16 10  CREATININE 0.74 0.59  GLUCOSE 109* 135*  CALCIUM 8.1* 8.2*   Lab Results  Component Value Date   INR 1.00 10/27/2018     Recent Radiographic Studies :  Dg Chest 2 View  Result Date: 10/27/2018 CLINICAL DATA:  73 year old female preoperative study for knee surgery. EXAM: CHEST - 2 VIEW COMPARISON:  Chest radiographs 02/06/2012. FINDINGS: Incidental bilateral nipple shadows. Lung volumes and mediastinal contours are within normal limits. Visualized tracheal air column is within normal limits. Both lungs appear clear. No pneumothorax or pleural effusion. No acute osseous abnormality identified. Negative visible bowel gas pattern. IMPRESSION: Negative.  No acute cardiopulmonary abnormality. Electronically Signed   By: Genevie Ann M.D.   On: 10/27/2018 11:14    DISCHARGE INSTRUCTIONS: Discharge Instructions    CPM   Complete by:  As directed    Continuous passive motion machine (CPM):      Use the CPM from 0 to 60 degrees for 6-8 hours per day.      You may increase by 5-10 per day.  You may break it up into 2 or 3 sessions per day.      Use CPM for 3-4 weeks or until you are told to stop.   Call MD / Call 911   Complete by:  As directed    If you experience chest pain or shortness of breath, CALL 911 and be transported to the hospital emergency room.  If you develope a fever above 101 F, pus (white drainage) or increased drainage or redness at the wound, or calf pain, call your surgeon's office.   Change dressing   Complete by:  As directed    DO NOT CHANGE YOUR DRESSING.   Constipation Prevention   Complete by:  As directed    Drink plenty of fluids.  Prune juice may be helpful.  You may use a stool softener, such as Colace (over the counter) 100 mg twice a day.  Use MiraLax (over the counter) for constipation as needed.   Diet general   Complete by:  As directed    Discharge instructions   Complete by:  As directed    [place] items at home which could result in a fall. This includes throw rugs or furniture in walking pathways ICE to the affected joint every three hours while awake for 30 minutes at a time, for at least the first 3-5 days, and then as needed for pain and swelling.  Continue to use ice for pain and swelling. You  may notice swelling that will progress down to the foot and ankle.  This is normal after surgery.  Elevate your leg when you are not up walking on it.   Continue to use the breathing machine you got in the hospital (incentive spirometer) which will help keep your temperature down.  It is common for your temperature to cycle up and down following surgery, especially at night when you are not up moving around and exerting yourself.  The breathing machine keeps your lungs expanded and your temperature down.   DIET:  As you were doing prior to hospitalization, we recommend a well-balanced diet.  DRESSING / WOUND CARE / SHOWERING  Keep the surgical dressing until follow up.  The dressing is water proof, so you can shower without any extra covering.  IF THE DRESSING FALLS OFF or the wound gets wet inside, change the dressing with sterile gauze.  Please use good hand washing techniques before changing the dressing.  Do not use any lotions or creams on the incision until instructed by your surgeon.    ACTIVITY  Increase activity slowly as tolerated, but follow the weight bearing instructions below.   No driving for 6 weeks or until further direction given by your physician.  You cannot drive while taking narcotics.  No lifting or carrying greater than 10 lbs. until further directed by your surgeon. Avoid periods of inactivity such as sitting longer than an hour when not asleep. This helps prevent blood clots.  You may return to work once you are authorized by your doctor.     WEIGHT BEARING   Partial weight bearing with assist device as directed.   50%   EXERCISES  Results after joint replacement surgery are often greatly improved when you follow the exercise, range of motion and muscle strengthening exercises prescribed by your doctor. Safety measures are also important to protect the joint from further injury. Any time any of these exercises cause you to have increased pain or swelling, decrease what you are doing until you are comfortable again and then slowly increase them. If you have problems or questions, call your caregiver or physical therapist for advice.   Rehabilitation is important following a joint replacement. After just a few days of immobilization, the muscles of the leg can become weakened and shrink (atrophy).  These exercises are designed to build up the tone and strength of the thigh and leg muscles and to improve motion. Often times heat used for twenty to thirty minutes before working out will loosen up your tissues and help with improving the range of motion but do not use heat for the first two weeks following surgery (sometimes heat can increase post-operative swelling).   These exercises can be done on a training (exercise) mat,  on a table or on a bed. Use whatever works the best and is most comfortable for you.    Use music or television while you are exercising so that the exercises are a pleasant break in your day. This will make your life better with the exercises acting as a break in your routine that you can look forward to.   Perform all exercises about fifteen times, three times per day or as directed.  You should exercise both the operative leg and the other leg as well.   Exercises include:  Quad Sets - Tighten up the muscle on the front of the thigh (Quad) and hold for 5-10 seconds.   Straight Leg Raises - With your knee straight (  if you were given a brace, keep it on), lift the leg to 60 degrees, hold for 3 seconds, and slowly lower the leg.  Perform this exercise against resistance later as your leg gets  stronger.  Leg Slides: Lying on your back, slowly slide your foot toward your buttocks, bending your knee up off the floor (only go as far as is comfortable). Then slowly slide your foot back down until your leg is flat on the floor again.  Angel Wings: Lying on your back spread your legs to the side as far apart as you can without causing discomfort.  Hamstring Strength:  Lying on your back, push your heel against the floor with your leg straight by tightening up the muscles of your buttocks.  Repeat, but this time bend your knee to a comfortable angle, and push your heel against the floor.  You may put a pillow under the heel to make it more comfortable if necessary.   A rehabilitation program following joint replacement surgery can speed recovery and prevent re-injury in the future due to weakened muscles. Contact your doctor or a physical therapist for more information on knee rehabilitation.    CONSTIPATION  Constipation is defined medically as fewer than three stools per week and severe constipation as less than one stool per week.  Even if you have a regular bowel pattern at home, your normal regimen is likely to be disrupted due to multiple reasons following surgery.  Combination of anesthesia, postoperative narcotics, change in appetite and fluid intake all can affect your bowels.   YOU MUST use at least one of the following options; they are listed in order of increasing strength to get the job done.  They are all available over the counter, and you may need to use some, POSSIBLY even all of these options:    Drink plenty of fluids (prune juice may be helpful) and high fiber foods Colace 100 mg by mouth twice a day  Senokot for constipation as directed and as needed Dulcolax (bisacodyl), take with full glass of water  Miralax (polyethylene glycol) once or twice a day as needed.  If you have tried all these things and are unable to have a bowel movement in the first 3-4 days after surgery  call either your surgeon or your primary doctor.    If you experience loose stools or diarrhea, hold the medications until you stool forms back up.  If your symptoms do not get better within 1 week or if they get worse, check with your doctor.  If you experience "the worst abdominal pain ever" or develop nausea or vomiting, please contact the office immediately for further recommendations for treatment.   ITCHING:  If you experience itching with your medications, try taking only a single pain pill, or even half a pain pill at a time.  You can also use Benadryl over the counter for itching or also to help with sleep.   TED HOSE STOCKINGS:  Use stockings on both legs until for at least 2 weeks or as directed by physician office. They may be removed at night for sleeping.  MEDICATIONS:  See your medication summary on the "After Visit Summary" that nursing will review with you.  You may have some home medications which will be placed on hold until you complete the course of blood thinner medication.  It is important for you to complete the blood thinner medication as prescribed.  PRECAUTIONS:  If you experience chest pain or shortness of  breath - call 911 immediately for transfer to the hospital emergency department.   If you develop a fever greater that 101 F, purulent drainage from wound, increased redness or drainage from wound, foul odor from the wound/dressing, or calf pain - CONTACT YOUR SURGEON.                                                   FOLLOW-UP APPOINTMENTS:  If you do not already have a post-op appointment, please call the office for an appointment to be seen by your surgeon.  Guidelines for how soon to be seen are listed in your "After Visit Summary", but are typically between 1-4 weeks after surgery.  OTHER INSTRUCTIONS:   Knee Replacement:  Do not place pillow under knee, focus on keeping the knee straight while resting. CPM instructions: 0-90 degrees, 2 hours in the morning, 2  hours in the afternoon, and 2 hours in the evening. Place foam block, curve side up under heel at all times except when in CPM or when walking.  DO NOT modify, tear, cut, or change the foam block in any way.  MAKE SURE YOU:  Understand these instructions.  Get help right away if you are not doing well or get worse.    Thank you for letting us be a part of your medical care team.  It is a privilege we respect greatly.  We hope these instructions will help you stay on track for a fast and full recovery!   Do not put a pillow under the knee. Place it under the heel.   Complete by:  As directed    Driving restrictions   Complete by:  As directed    No driving for 6 weeks   Increase activity slowly as tolerated   Complete by:  As directed    Lifting restrictions   Complete by:  As directed    No lifting for 6 weeks   Partial weight bearing   Complete by:  As directed    % Body Weight:  50%   Laterality:  left   Extremity:  Lower   Patient may shower   Complete by:  As directed    You may shower over your dressing   TED hose   Complete by:  As directed    Use stockings (TED hose) for 3 weeks on left  leg.  You may remove them at night for sleeping.      DISCHARGE MEDICATIONS:   Allergies as of 11/06/2018      Reactions   Penicillins Hives, Other (See Comments)   UNSPECIFIED CHILDHOOD REACTION  Had reaction as a child and as an adult. Had additional reaction but not clear on the specifics   Sulfa Antibiotics Hives   Albuterol Other (See Comments)   UNSPECIFIED REACTION  States she cannot tolerate albuterol without side effects      Medication List    STOP taking these medications   aspirin 81 MG tablet Replaced by:  aspirin 325 MG EC tablet   diclofenac sodium 1 % Gel Commonly known as:  VOLTAREN   ranitidine 300 MG capsule Commonly known as:  ZANTAC     TAKE these medications   anastrozole 1 MG tablet Commonly known as:  ARIMIDEX TAKE 1 TABLET ONCE DAILY.    aspirin 325 MG EC tablet Take 1  tablet (325 mg total) by mouth daily with breakfast. Start taking on:  November 07, 2018 Replaces:  aspirin 81 MG tablet   benzonatate 100 MG capsule Commonly known as:  TESSALON PERLES Take 1 capsule (100 mg total) by mouth 3 (three) times daily as needed for cough.   BONIVA 150 MG tablet Generic drug:  ibandronate Take 1 tablet (150 mg total) by mouth every 30 (thirty) days. Take in the morning with a full glass of water, on an empty stomach, and do not take anything else by mouth or lie down for the next 30 min.   budesonide-formoterol 160-4.5 MCG/ACT inhaler Commonly known as:  SYMBICORT Inhale 2 puffs into the lungs 2 (two) times daily.   CALCIUM CITRATE-VITAMIN D PO Take 1 tablet by mouth daily.   fluticasone 50 MCG/ACT nasal spray Commonly known as:  FLONASE Place 2 sprays into both nostrils daily as needed for allergies or rhinitis.   levalbuterol 45 MCG/ACT inhaler Commonly known as:  XOPENEX HFA Inhale two puffs every 4-6 hours if needed for cough or wheeze What changed:    how much to take  how to take this  when to take this  reasons to take this  additional instructions   mesalamine 1.2 g EC tablet Commonly known as:  LIALDA Take 1.2 g by mouth daily with breakfast.   methocarbamol 500 MG tablet Commonly known as:  ROBAXIN Take 1 tablet (500 mg total) by mouth every 8 (eight) hours as needed for muscle spasms.   montelukast 10 MG tablet Commonly known as:  SINGULAIR Take one tablet once daily as directed What changed:    how much to take  how to take this  when to take this  additional instructions   multivitamins with iron Tabs tablet Take 1 tablet by mouth daily.   omeprazole 40 MG capsule Commonly known as:  PRILOSEC Take one capsule twice daily as directed What changed:    how much to take  how to take this  when to take this  additional instructions   oxyCODONE 5 MG immediate release  tablet Commonly known as:  Oxy IR/ROXICODONE Take 1-2 tablets (5-10 mg total) by mouth every 4 (four) hours as needed for moderate pain (pain score 4-6).   sodium chloride 0.65 % nasal spray Commonly known as:  OCEAN Place 1 spray into the nose daily as needed for congestion.   Vitamin D 50 MCG (2000 UT) Caps Take 2,000 Units by mouth daily.   vitamin E 400 UNIT capsule Take 400 Units by mouth daily.            Durable Medical Equipment  (From admission, onward)         Start     Ordered   11/04/18 1456  DME Walker rolling  Once    Question:  Patient needs a walker to treat with the following condition  Answer:  S/P total knee arthroplasty, left   11/04/18 1459   11/04/18 1456  DME 3 n 1  Once     11/04/18 1459   11/04/18 1456  DME Bedside commode  Once    Question:  Patient needs a bedside commode to treat with the following condition  Answer:  S/P total knee arthroplasty, left   11/04/18 1459           Discharge Care Instructions  (From admission, onward)         Start     Ordered   11/06/18 0000  Partial  weight bearing    Question Answer Comment  % Body Weight 50%   Laterality left   Extremity Lower      11/06/18 1113   11/06/18 0000  Change dressing    Comments:  DO NOT CHANGE YOUR DRESSING.   11/06/18 1113          FOLLOW UP VISIT:    DISPOSITION:   Home  CONDITION:  Stable   Aaron Edelman D. [place], [place] [phone]  11/06/2018 11:14 AM

## 2018-11-06 NOTE — Progress Notes (Signed)
Physical Therapy Treatment Patient Details Name: Cindy Arias MRN: [id number] DOB: 1945/12/19 Today's Date: 11/06/2018    History of Present Illness Pt is a 73 y.o. female s/p elective L TKA on 11/04/18. PMH includes asthma, osteopenia, breast CA.    PT Comments    Pt progressing well towards goals. Able to tolerate increased gait distance this session and required less assist this session. Reviewed and practiced supine HEP. Pt eager to go home and reports sister can help as needed. Will continue to follow acutely to maximize functional mobility independence and safety.    Follow Up Recommendations  Follow surgeon's recommendation for DC plan and follow-up therapies;Supervision for mobility/OOB     Equipment Recommendations  None recommended by PT    Recommendations for Other Services       Precautions / Restrictions Precautions Precautions: Knee Precaution Booklet Issued: Yes (comment) Precaution Comments: Reviewed knee precautions and supine HEP.  Restrictions Weight Bearing Restrictions: Yes LLE Weight Bearing: Partial weight bearing LLE Partial Weight Bearing Percentage or Pounds: 50    Mobility  Bed Mobility Overal bed mobility: Modified Independent             General bed mobility comments: Used UEs to assist with LLE movement, however, did not require any physical assist from flat surface.   Transfers Overall transfer level: Needs assistance Equipment used: Rolling walker (2 wheeled) Transfers: Sit to/from Stand Sit to Stand: Supervision         General transfer comment: Supervision for safety. Cues for safe hand placement.   Ambulation/Gait Ambulation/Gait assistance: Min guard;Supervision Gait Distance (Feet): 125 Feet Assistive device: Rolling walker (2 wheeled) Gait Pattern/deviations: Step-to pattern;Decreased weight shift to left;Decreased stance time - left;Antalgic;Decreased step length - right;Decreased step length - left Gait velocity:  Decreased    General Gait Details: Improved tolerance for gait this session. Cues for heel strike and knee extension during stance phase. Required cues for sequencing using RW as well. Min guard to supervision for safety. No physical assist required.    Stairs             Wheelchair Mobility    Modified Rankin (Stroke Patients Only)       Balance Overall balance assessment: Needs assistance Sitting-balance support: No upper extremity supported;Feet supported Sitting balance-Leahy Scale: Good     Standing balance support: Bilateral upper extremity supported;During functional activity;No upper extremity supported Standing balance-Leahy Scale: Fair Standing balance comment: Can static stand without UE support                             Cognition Arousal/Alertness: Awake/alert Behavior During Therapy: WFL for tasks assessed/performed Overall Cognitive Status: Within Functional Limits for tasks assessed                                        Exercises Total Joint Exercises Ankle Circles/Pumps: AROM;Both;10 reps;Supine Quad Sets: AROM;Left;Supine;5 reps Towel Squeeze: AROM;Both;Supine;5 reps Short Arc QuadSinclair Ship;Left;5 reps;Supine Heel Slides: AAROM;Left;5 reps;Supine Hip ABduction/ADduction: AAROM;Left;5 reps;Supine Straight Leg Raises: AAROM;Left;5 reps;Supine Long Arc Quad: (verbally reviewed and demonstrated) Knee Flexion: (verbally reviewed and demonstration. )    General Comments        Pertinent Vitals/Pain Pain Assessment: Faces Faces Pain Scale: Hurts little more Pain Location: L knee  Pain Descriptors / Indicators: Aching;Discomfort;Guarding Pain Intervention(s): Limited activity within patient's tolerance;Monitored during session;Repositioned  Home Living                      Prior Function            PT Goals (current goals can now be found in the care plan section) Acute Rehab PT Goals Patient Stated  Goal: to go home  PT Goal Formulation: With patient Time For Goal Achievement: 11/18/18 Potential to Achieve Goals: Good Progress towards PT goals: Progressing toward goals    Frequency    7X/week      PT Plan Current plan remains appropriate    Co-evaluation              AM-PAC PT "6 Clicks" Mobility   Outcome Measure  Help needed turning from your back to your side while in a flat bed without using bedrails?: None Help needed moving from lying on your back to sitting on the side of a flat bed without using bedrails?: None Help needed moving to and from a bed to a chair (including a wheelchair)?: A Little Help needed standing up from a chair using your arms (e.g., wheelchair or bedside chair)?: A Little Help needed to walk in hospital room?: A Little Help needed climbing 3-5 steps with a railing? : A Little 6 Click Score: 20    End of Session Equipment Utilized During Treatment: Gait belt Activity Tolerance: Patient tolerated treatment well Patient left: in bed;with call bell/phone within reach Nurse Communication: Mobility status PT Visit Diagnosis: Other abnormalities of gait and mobility (R26.89);Pain Pain - Right/Left: Left Pain - part of body: Knee     Time: 1345-1410 PT Time Calculation (min) (ACUTE ONLY): 25 min  Charges:  $Gait Training: 8-22 mins $Therapeutic Exercise: 8-22 mins                     Leighton Ruff, PT, DPT  Acute Rehabilitation Services  Pager: [phone] Office: [phone]    Rudean Hitt 11/06/2018, 2:29 PM

## 2018-11-06 NOTE — Progress Notes (Signed)
Subjective: 2 Days Post-Op Procedure(s) (LRB): LEFT TOTAL KNEE ARTHROPLASTY (Left) Patient reports pain as mild and moderate.    Objective: Vital signs in last 24 hours: Temp:  [98.6 F (37 C)-99 F (37.2 C)] 99 F (37.2 C) (01/09 0300) Pulse Rate:  [82-101] 101 (01/09 0300) Resp:  [18] 18 (01/08 1500) BP: (126-141)/(59-74) 126/59 (01/09 0300) SpO2:  [97 %-100 %] 97 % (01/09 0834)  Intake/Output from previous day: 01/08 0701 - 01/09 0700 In: 720 [P.O.:720] Out: 1000 [Urine:1000] Intake/Output this shift: Total I/O In: 120 [P.O.:120] Out: -   Recent Labs    11/05/18 0153 11/06/18 0124  HGB 9.0* 9.7*   Recent Labs    11/05/18 0153 11/06/18 0124  WBC 6.6 7.4  RBC 3.24* 3.38*  HCT 28.7* 29.9*  PLT 220 240   Recent Labs    11/05/18 0153 11/06/18 0124  NA 136 135  K 4.0 3.6  CL 108 103  CO2 22 25  BUN 16 10  CREATININE 0.74 0.59  GLUCOSE 109* 135*  CALCIUM 8.1* 8.2*   No results for input(s): LABPT, INR in the last 72 hours.  Sensation intact distally Intact pulses distally Dorsiflexion/Plantar flexion intact Incision: no drainage    Assessment/Plan: 2 Days Post-Op Procedure(s) (LRB): LEFT TOTAL KNEE ARTHROPLASTY (Left) Discharge home with home health    Cindy Arias 11/06/2018, 11:06 AM

## 2018-11-06 NOTE — Progress Notes (Signed)
Discharge instructions (including medications) discussed with and copy provided to patient/caregiver 

## 2018-11-06 NOTE — Plan of Care (Signed)
Pt progressing well, plan for discharge later today after next therapy session this early evening. Pt will go home with husband.

## 2018-11-06 NOTE — Progress Notes (Signed)
Physical Therapy Treatment Patient Details Name: Cindy Arias MRN: [id number] DOB: 16-Dec-1945 Today's Date: 11/06/2018    History of Present Illness Pt is a 73 y.o. female s/p elective L TKA on 11/04/18. PMH includes asthma, osteopenia, breast CA.    PT Comments    Pt progressing towards goals and able to increase gait distance and practice stair training this session. Continues to be limited by stiffness and fatigue. Required min guard A for gait and stair navigation using RW. Reports sister will be able to assist at home and pt is eager to return home. Will continue to follow acutely to maximize functional mobility independence and safety.    Follow Up Recommendations  Follow surgeon's recommendation for DC plan and follow-up therapies;Supervision for mobility/OOB     Equipment Recommendations  None recommended by PT    Recommendations for Other Services       Precautions / Restrictions Precautions Precautions: Knee Precaution Booklet Issued: Yes (comment) Precaution Comments: Reviewed knee precautions with pt.  Restrictions Weight Bearing Restrictions: Yes LLE Weight Bearing: Partial weight bearing LLE Partial Weight Bearing Percentage or Pounds: 50    Mobility  Bed Mobility Overal bed mobility: Needs Assistance Bed Mobility: Supine to Sit     Supine to sit: Min assist     General bed mobility comments: Min A for LLE assist. Increased stiffness reported, so required increased assist this session.   Transfers Overall transfer level: Needs assistance Equipment used: Rolling walker (2 wheeled) Transfers: Sit to/from Stand Sit to Stand: Min guard         General transfer comment: Min guard for safety. Cues for safe hand placement.   Ambulation/Gait Ambulation/Gait assistance: Min guard Gait Distance (Feet): 60 Feet Assistive device: Rolling walker (2 wheeled) Gait Pattern/deviations: Step-to pattern;Decreased weight shift to left;Decreased stance time -  left;Antalgic;Decreased step length - right;Decreased step length - left Gait velocity: Decreased   General Gait Details: Slow, guarded gait. Able to increase distance from previous session. Cues to relax shoulders during ambulation. Limited secondary to pain/stiffness.    Stairs Stairs: Yes Stairs assistance: Min guard Stair Management: No rails;Step to pattern;Backwards;With walker Number of Stairs: 2 General stair comments: Practiced threshold step X 2. Reviewed/practiced ascending step backward with RW and descending forwards. Min guard for safety, no LOB noted. Cues for LE sequencing.    Wheelchair Mobility    Modified Rankin (Stroke Patients Only)       Balance Overall balance assessment: Needs assistance Sitting-balance support: No upper extremity supported;Feet supported Sitting balance-Leahy Scale: Good     Standing balance support: Bilateral upper extremity supported;During functional activity;No upper extremity supported Standing balance-Leahy Scale: Fair Standing balance comment: Can static stand without UE support at sink to wash hands                             Cognition Arousal/Alertness: Awake/alert Behavior During Therapy: WFL for tasks assessed/performed Overall Cognitive Status: Within Functional Limits for tasks assessed                                        Exercises      General Comments General comments (skin integrity, edema, etc.): Pt reports sister will be able to assist as needed upon d/c.       Pertinent Vitals/Pain Pain Assessment: Faces Faces Pain Scale: Hurts even more Pain Location:  L knee  Pain Descriptors / Indicators: Aching;Discomfort;Guarding Pain Intervention(s): Limited activity within patient's tolerance;Monitored during session;Repositioned    Home Living                      Prior Function            PT Goals (current goals can now be found in the care plan section) Acute Rehab  PT Goals Patient Stated Goal: "to go home and wash my hair" PT Goal Formulation: With patient Time For Goal Achievement: 11/18/18 Potential to Achieve Goals: Good Progress towards PT goals: Progressing toward goals    Frequency    7X/week      PT Plan Current plan remains appropriate    Co-evaluation              AM-PAC PT "6 Clicks" Mobility   Outcome Measure  Help needed turning from your back to your side while in a flat bed without using bedrails?: A Little Help needed moving from lying on your back to sitting on the side of a flat bed without using bedrails?: A Little Help needed moving to and from a bed to a chair (including a wheelchair)?: A Little Help needed standing up from a chair using your arms (e.g., wheelchair or bedside chair)?: A Little Help needed to walk in hospital room?: A Little Help needed climbing 3-5 steps with a railing? : A Little 6 Click Score: 18    End of Session Equipment Utilized During Treatment: Gait belt Activity Tolerance: Patient tolerated treatment well Patient left: in chair;with call bell/phone within reach Nurse Communication: Mobility status PT Visit Diagnosis: Other abnormalities of gait and mobility (R26.89);Pain Pain - Right/Left: Left Pain - part of body: Knee     Time: 4098-1191 PT Time Calculation (min) (ACUTE ONLY): 30 min  Charges:  $Gait Training: 23-37 mins                     Leighton Ruff, PT, DPT  Acute Rehabilitation Services  Pager: [phone] Office: [phone]    Rudean Hitt 11/06/2018, 9:19 AM

## 2018-11-06 NOTE — Care Management Note (Signed)
Case Management Note  Patient Details  Name: Cindy Arias MRN: [id number] Date of Birth: 10/10/1946  Subjective/Objective:  Pt is a 73 y.o. female s/p elective L TKA on 11/04/18.                   Action/Plan: Patient was preoperatively setup with [facility], no changes. Will have family support at discharge.    Expected Discharge Date:  11/06/18               Expected Discharge Plan:  [place]  In-House Referral:  NA  Discharge planning Services  CM Consult  Post Acute Care Choice:  Home Health Choice offered to:  Patient  DME Arranged:  N/A(has DME) DME Agency:     HH Arranged:  PT HH Agency:  [facility] (formerly [facility])  Status of Service:  Completed, signed off  If discussed at [employer], dates discussed:    Additional Comments:  Ninfa Meeker, RN 11/06/2018, 1:14 PM

## 2018-11-07 ENCOUNTER — Telehealth (INDEPENDENT_AMBULATORY_CARE_PROVIDER_SITE_OTHER): Payer: Self-pay | Admitting: Orthopaedic Surgery

## 2018-11-07 NOTE — Telephone Encounter (Signed)
PT for patient 1wk/ 1, 3wk/ 1, 2wk/ 1. Call with verbal orders.

## 2018-11-10 ENCOUNTER — Telehealth (INDEPENDENT_AMBULATORY_CARE_PROVIDER_SITE_OTHER): Payer: Self-pay | Admitting: Orthopaedic Surgery

## 2018-11-10 NOTE — Telephone Encounter (Signed)
OK 

## 2018-11-10 NOTE — Telephone Encounter (Signed)
Patient left a voicemail stating she had surgery with Dr. Durward Fortes on 11/04/18 and has hemorrhoids, as well as constipation and is requesting a medication to help with the symptoms.  Patient states she was also told she needed to sleep "flat on her back" and is asking if she has to continue that or if she can change sleeping positions.

## 2018-11-10 NOTE — Telephone Encounter (Signed)
Please advise 

## 2018-11-10 NOTE — Telephone Encounter (Signed)
LMOM, PCP should handle constipation and hemorrhoids, can lay in any position per Dr. Durward Fortes.

## 2018-11-10 NOTE — Telephone Encounter (Signed)
LMOM with [facility]

## 2018-11-17 ENCOUNTER — Inpatient Hospital Stay (INDEPENDENT_AMBULATORY_CARE_PROVIDER_SITE_OTHER): Payer: Medicare Other | Admitting: Orthopaedic Surgery

## 2018-11-18 ENCOUNTER — Encounter (INDEPENDENT_AMBULATORY_CARE_PROVIDER_SITE_OTHER): Payer: Self-pay | Admitting: Orthopaedic Surgery

## 2018-11-18 ENCOUNTER — Ambulatory Visit (INDEPENDENT_AMBULATORY_CARE_PROVIDER_SITE_OTHER): Payer: Medicare Other

## 2018-11-18 ENCOUNTER — Ambulatory Visit (INDEPENDENT_AMBULATORY_CARE_PROVIDER_SITE_OTHER): Payer: Medicare Other | Admitting: Orthopaedic Surgery

## 2018-11-18 VITALS — BP 117/63 | HR 89

## 2018-11-18 DIAGNOSIS — M1712 Unilateral primary osteoarthritis, left knee: Secondary | ICD-10-CM

## 2018-11-18 NOTE — Progress Notes (Signed)
Office Visit Note   Patient: Cindy Arias           Date of Birth: October 15, 1946           MRN: [id number] Visit Date: 11/18/2018              Requested by: Cari Caraway, [place], [place] [address] PCP: Cari Caraway, MD   Assessment & Plan: Visit Diagnoses:  1. Primary osteoarthritis of left knee     Plan: 2 weeks status post primary left total knee replacement doing very well.  Excellent range of motion from about -2 or 3 degrees to 95.  Independent with a walker.  Wound is healing without problem.  Clips removed and Steri-Strips applied.  Weightbearing as tolerated..  Outpatient therapy.  Office 2 weeks  Follow-Up Instructions: Return in about 2 weeks (around 12/02/2018).   Orders:  Orders Placed This Encounter  Procedures  . XR KNEE 3 VIEW LEFT   No orders of the defined types were placed in this encounter.     Procedures: No procedures performed   Clinical Data: No additional findings.   Subjective: Chief Complaint  Patient presents with  . Left Knee - Routine Post Op, Follow-up  . Knee Pain    Surgery 11/04/2018....Marland KitchenMarland KitchenLt knee--little sore.  2 weeks status post primary left total knee replacement doing very well.  No shortness of breath or chest pain independent with a walker.  Physical therapy ends this week.  Would like to start outpatient PT  HPI  Review of Systems  Constitutional: Negative.   HENT: Negative.   Eyes: Negative.   Respiratory: Negative.   Cardiovascular: Negative.   Gastrointestinal: Negative.   Endocrine: Negative.   Genitourinary: Negative.   Musculoskeletal: Positive for gait problem.  Skin: Negative.   Allergic/Immunologic: Negative.   Hematological: Negative.   Psychiatric/Behavioral: Negative.      Objective: Vital Signs: BP 117/63 (BP Location: Left Arm, Patient Position: Sitting, Cuff Size: Normal)   Pulse 89   LMP 02/13/2012   Physical Exam  Ortho Exam left total knee incision healing without  problem.  Clips removed and Steri-Strips applied.  About 95 degrees of flexion.  Lacks just a few degrees to full extension but very  "springy".  Neurovascular exam intact.  Minimal swelling of ankle.  No instability no calf pain  Specialty Comments:  No specialty comments available.  Imaging: No results found.   PMFS History: Patient Active Problem List   Diagnosis Date Noted  . Primary osteoarthritis of left knee 11/04/2018  . S/P total knee arthroplasty, left 11/04/2018  . Bilateral primary osteoarthritis of knee 02/21/2018  . 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 Malignant neoplasm of overlapping sites of right breast in female, estrogen receptor positive ([place]) 11/19/2016   Past Medical History:  Diagnosis Date  . Arthritis    "bone on bone'  left knee  . Asthma    LAST FLARE UP 3 YRS AGO  . Breast cancer ([place])    RIGHT BREAST  . Colitis   . Dysphagia   . Dyspnea    NONE IN 3 YRS  . Esophageal stricture   . GERD (gastroesophageal reflux disease)   . History of infertility   . History of radiation therapy 12/31/16- 01/21/17   Right Breast 42.56 Gy in 16 fractions  . Osteopenia   . Pneumonia   . Rosacea   . Ulcerative colitis ([place])  Family History  Problem Relation Age of Onset  . Cancer Mother        Liver- 2003 Lymphoma  . Rheum arthritis Mother   . Stroke Father   . Diabetes Maternal Grandfather        Type 2   . Heart disease Maternal Grandfather   . Hypertension Maternal Grandmother   . Heart disease Maternal Grandmother   . Osteoporosis Maternal Grandmother   . Anesthesia problems Neg Hx     Past Surgical History:  Procedure Laterality Date  . BREAST LUMPECTOMY WITH RADIOACTIVE SEED AND SENTINEL LYMPH NODE BIOPSY Right 11/21/2016   Procedure: BREAST LUMPECTOMY WITH RADIOACTIVE SEED AND SENTINEL LYMPH NODE BIOPSY;  Surgeon: Excell Seltzer, MD;  Location: [facility];   Service: General;  Laterality: Right;  . BREAST SURGERY     RIGHT BREAST LUMPECTOMY  . BUNIONECTOMY WITH HAMMERTOE RECONSTRUCTION Left 07/2014  . CATARACT EXTRACTION Right 10/2015  . ESOPHAGEAL DILATION    . EYE SURGERY     OD  . JOINT REPLACEMENT    . Repair of Chiari Malformation  02/05/13  . SUBOCCIPITAL CRANIECTOMY CERVICAL LAMINECTOMY  02/13/2012   Procedure: SUBOCCIPITAL CRANIECTOMY CERVICAL LAMINECTOMY/DURAPLASTY;  Surgeon: Ophelia Charter, MD;  Location: [facility];  Service: Neurosurgery;  Laterality: N/A;  Suboccipital craniectomy with upper cervical laminectomy and duraplasty  . TOTAL KNEE ARTHROPLASTY Left 11/04/2018  . TOTAL KNEE ARTHROPLASTY Left 11/04/2018   Procedure: LEFT TOTAL KNEE ARTHROPLASTY;  Surgeon: Garald Balding, MD;  Location: [facility];  Service: Orthopedics;  Laterality: Left;  . TUBAL LIGATION  1987   Social History   Occupational History  . Not on file  Tobacco Use  . Smoking status: Never Smoker  . Smokeless tobacco: Never Used  . Tobacco comment: she smoked for a few months in college  Substance and Sexual Activity  . Alcohol use: Yes    Comment: 1 glass of wine monthly  . Drug use: No    Comment: Smoked in college ~ 2 years  . Sexual activity: Not on file

## 2018-11-21 ENCOUNTER — Telehealth (INDEPENDENT_AMBULATORY_CARE_PROVIDER_SITE_OTHER): Payer: Self-pay | Admitting: Orthopaedic Surgery

## 2018-11-21 ENCOUNTER — Encounter (INDEPENDENT_AMBULATORY_CARE_PROVIDER_SITE_OTHER): Payer: Self-pay | Admitting: Family Medicine

## 2018-11-21 ENCOUNTER — Ambulatory Visit (INDEPENDENT_AMBULATORY_CARE_PROVIDER_SITE_OTHER): Payer: Medicare Other | Admitting: Family Medicine

## 2018-11-21 DIAGNOSIS — M25462 Effusion, left knee: Secondary | ICD-10-CM

## 2018-11-21 DIAGNOSIS — M1712 Unilateral primary osteoarthritis, left knee: Secondary | ICD-10-CM

## 2018-11-21 DIAGNOSIS — Z96652 Presence of left artificial knee joint: Secondary | ICD-10-CM | POA: Diagnosis not present

## 2018-11-21 MED ORDER — LIDOCAINE HCL 1 % IJ SOLN: 3.0000 mL | Freq: Once | INTRAMUSCULAR | Status: DC

## 2018-11-21 NOTE — Telephone Encounter (Signed)
Patient has been schedule with Dr. Junius Roads on 11/21/18 at 1:40 pm for evaluation.

## 2018-11-21 NOTE — Progress Notes (Signed)
Office Visit Note   Patient: Cindy Arias           Date of Birth: 09/04/1946           MRN: [id number] Visit Date: 11/21/2018 Requested by: Cari Caraway, [place], [place] [address] PCP: Cari Caraway, MD  Subjective: Chief Complaint  Patient presents with  . Left Knee - Edema    Swelling and warmth in the knee since yesterday. No injury. No PT yesterday. Incision is not red, no drainage from the incision.    HPI: She is here with worsening left knee pain.  Status post knee replacement on January 7.  She was doing well at her postop visit Tuesday, but since then she has had increased swelling in the knee with some warmth to the touch.  No fevers or chills, appetite is normal, she otherwise feels fine.  She is on aspirin for anticoagulation.              ROS: She has a history of colitis.  Colitis is well controlled.  Otherwise noncontributory  Objective: Vital Signs: LMP 02/13/2012   Physical Exam:  Left knee: There is no erythema but slight warmth, and 1+ effusion today.  The surgical incision is clean and dry, healing well.  She has 90 degrees flexion and lacks about 3 degrees from full extension today.  Dr. Inda Merlin examined her as well.  Imaging: Musculoskeletal ultrasound: Her superior lateral knee was imaged prior to aspiration, she has synovitis with a small effusion.  Assessment & Plan: 1.  Left knee swelling status post knee replacement, probably just inflammation from increased activity but cannot completely rule out infection -Discussed with patient and elected to attempt aspiration today and send the fluid for culture.  We will check results daily.  If she gets worse over the weekend she will call the on-call physician and probably meet them in the ER.   Follow-Up Instructions: No follow-ups on file.      Procedures: Left knee aspiration: After sterile prep with Betadine, injected 3 cc 1% lidocaine without epinephrine then using  ultrasound to guide needle placement, attempted aspiration from superolateral approach.  There really was not a lot of fluid, I was able to get enough bloody fluid for a culture.   PMFS History: Patient Active Problem List   Diagnosis Date Noted  . Primary osteoarthritis of left knee 11/04/2018  . S/P total knee arthroplasty, left 11/04/2018  . Bilateral primary osteoarthritis of knee 02/21/2018  . 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 Malignant neoplasm of overlapping sites of right breast in female, estrogen receptor positive ([place]) 11/19/2016   Past Medical History:  Diagnosis Date  . Arthritis    "bone on bone'  left knee  . Asthma    LAST FLARE UP 3 YRS AGO  . Breast cancer ([place])    RIGHT BREAST  . Colitis   . Dysphagia   . Dyspnea    NONE IN 3 YRS  . Esophageal stricture   . GERD (gastroesophageal reflux disease)   . History of infertility   . History of radiation therapy 12/31/16- 01/21/17   Right Breast 42.56 Gy in 16 fractions  . Osteopenia   . Pneumonia   . Rosacea   . Ulcerative colitis ([place])     Family History  Problem Relation Age of Onset  . Cancer Mother        Liver- 2003  Lymphoma  . Rheum arthritis Mother   . Stroke Father   . Diabetes Maternal Grandfather        Type 2   . Heart disease Maternal Grandfather   . Hypertension Maternal Grandmother   . Heart disease Maternal Grandmother   . Osteoporosis Maternal Grandmother   . Anesthesia problems Neg Hx     Past Surgical History:  Procedure Laterality Date  . BREAST LUMPECTOMY WITH RADIOACTIVE SEED AND SENTINEL LYMPH NODE BIOPSY Right 11/21/2016   Procedure: BREAST LUMPECTOMY WITH RADIOACTIVE SEED AND SENTINEL LYMPH NODE BIOPSY;  Surgeon: Excell Seltzer, MD;  Location: [facility];  Service: General;  Laterality: Right;  . BREAST SURGERY     RIGHT BREAST LUMPECTOMY  . BUNIONECTOMY WITH HAMMERTOE RECONSTRUCTION Left 07/2014   . CATARACT EXTRACTION Right 10/2015  . ESOPHAGEAL DILATION    . EYE SURGERY     OD  . JOINT REPLACEMENT    . Repair of Chiari Malformation  02/05/13  . SUBOCCIPITAL CRANIECTOMY CERVICAL LAMINECTOMY  02/13/2012   Procedure: SUBOCCIPITAL CRANIECTOMY CERVICAL LAMINECTOMY/DURAPLASTY;  Surgeon: Ophelia Charter, MD;  Location: [facility];  Service: Neurosurgery;  Laterality: N/A;  Suboccipital craniectomy with upper cervical laminectomy and duraplasty  . TOTAL KNEE ARTHROPLASTY Left 11/04/2018  . TOTAL KNEE ARTHROPLASTY Left 11/04/2018   Procedure: LEFT TOTAL KNEE ARTHROPLASTY;  Surgeon: Garald Balding, MD;  Location: [facility];  Service: Orthopedics;  Laterality: Left;  . TUBAL LIGATION  1987   Social History   Occupational History  . Not on file  Tobacco Use  . Smoking status: Never Smoker  . Smokeless tobacco: Never Used  . Tobacco comment: she smoked for a few months in college  Substance and Sexual Activity  . Alcohol use: Yes    Comment: 1 glass of wine monthly  . Drug use: No    Comment: Smoked in college ~ 2 years  . Sexual activity: Not on file

## 2018-11-21 NOTE — Telephone Encounter (Signed)
Patient states surgery knee started swelling yesterday. Knee  stiff, swollen, hot to touch. Patient advised Dr. Durward Fortes is not in the office today. Myer Haff advised patient needs to be seen today with someone at the [facility] office. Debbie to call patient, and get her in their office today.

## 2018-11-22 ENCOUNTER — Encounter (INDEPENDENT_AMBULATORY_CARE_PROVIDER_SITE_OTHER): Payer: Self-pay | Admitting: Family Medicine

## 2018-11-24 ENCOUNTER — Telehealth (INDEPENDENT_AMBULATORY_CARE_PROVIDER_SITE_OTHER): Payer: Self-pay | Admitting: Family Medicine

## 2018-11-24 NOTE — Telephone Encounter (Signed)
Culture negative so far.

## 2018-11-27 LAB — ANAEROBIC AND AEROBIC CULTURE
AER RESULT:: NO GROWTH
MICRO NUMBER:: 101495
MICRO NUMBER:: 101496
SPECIMEN QUALITY:: ADEQUATE
SPECIMEN QUALITY:: ADEQUATE

## 2018-12-02 ENCOUNTER — Encounter (INDEPENDENT_AMBULATORY_CARE_PROVIDER_SITE_OTHER): Payer: Self-pay | Admitting: Orthopaedic Surgery

## 2018-12-02 ENCOUNTER — Ambulatory Visit (INDEPENDENT_AMBULATORY_CARE_PROVIDER_SITE_OTHER): Payer: Medicare Other | Admitting: Orthopaedic Surgery

## 2018-12-02 VITALS — BP 116/52 | HR 81 | Ht 60.0 in | Wt 109.0 lb

## 2018-12-02 DIAGNOSIS — Z96652 Presence of left artificial knee joint: Secondary | ICD-10-CM

## 2018-12-02 NOTE — Progress Notes (Signed)
Office Visit Note   Patient: Cindy Arias           Date of Birth: 10/27/1946           MRN: [id number] Visit Date: 12/02/2018              Requested by: Cari Caraway, [place], [place] [address] PCP: Cari Caraway, MD   Assessment & Plan: Visit Diagnoses:  1. History of left knee replacement     Plan: 1 month status post primary left total knee replacement doing well.  Independent with a cane.  Incision healing without problem.  Continues to go to physical therapy with excellent range of motion.  No use of pain medicine.  Continue with exercises and strengthening and return in 1 month  Follow-Up Instructions: Return in about 1 month (around 12/31/2018).   Orders:  No orders of the defined types were placed in this encounter.  No orders of the defined types were placed in this encounter.     Procedures: No procedures performed   Clinical Data: No additional findings.   Subjective: No chief complaint on file. Doing well 1 month after left total knee replacement.  Did see Dr. Junius Roads for an effusion several weeks ago fluid was negative for any white cells or bacteria on culture.  Presently doing well without any problems.  HPI  Review of Systems   Objective: Vital Signs: BP (!) 116/52 (BP Location: Left Arm, Patient Position: Sitting, Cuff Size: Normal)   Pulse 81   Ht 5' (1.524 m)   Wt 109 lb (49.4 kg)   LMP 02/13/2012   BMI 21.29 kg/m   Physical Exam  Ortho Exam left knee incision healing without a problem.  No instability.  Lacks just a few degrees to full extension but about 105 degrees of flexion.  No calf pain.  No popliteal fullness.  No distal edema.  Neurovascular exam intact.  Walks independently with a cane  Specialty Comments:  No specialty comments available.  Imaging: No results found.   PMFS History: Patient Active Problem List   Diagnosis Date Noted  . Primary osteoarthritis of left knee 11/04/2018  . History  of left knee replacement 11/04/2018  . Bilateral primary osteoarthritis of knee 02/21/2018  . 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 Malignant neoplasm of overlapping sites of right breast in female, estrogen receptor positive ([place]) 11/19/2016   Past Medical History:  Diagnosis Date  . Arthritis    "bone on bone'  left knee  . Asthma    LAST FLARE UP 3 YRS AGO  . Breast cancer ([place])    RIGHT BREAST  . Colitis   . Dysphagia   . Dyspnea    NONE IN 3 YRS  . Esophageal stricture   . GERD (gastroesophageal reflux disease)   . History of infertility   . History of radiation therapy 12/31/16- 01/21/17   Right Breast 42.56 Gy in 16 fractions  . Osteopenia   . Pneumonia   . Rosacea   . Ulcerative colitis ([facility])     Family History  Problem Relation Age of Onset  . Cancer Mother        Liver- 2003 Lymphoma  . Rheum arthritis Mother   . Stroke Father   . Diabetes Maternal Grandfather        Type 2   . Heart disease Maternal Grandfather   . Hypertension Maternal Grandmother   .  Heart disease Maternal Grandmother   . Osteoporosis Maternal Grandmother   . Anesthesia problems Neg Hx     Past Surgical History:  Procedure Laterality Date  . BREAST LUMPECTOMY WITH RADIOACTIVE SEED AND SENTINEL LYMPH NODE BIOPSY Right 11/21/2016   Procedure: BREAST LUMPECTOMY WITH RADIOACTIVE SEED AND SENTINEL LYMPH NODE BIOPSY;  Surgeon: Excell Seltzer, MD;  Location: [facility];  Service: General;  Laterality: Right;  . BREAST SURGERY     RIGHT BREAST LUMPECTOMY  . BUNIONECTOMY WITH HAMMERTOE RECONSTRUCTION Left 07/2014  . CATARACT EXTRACTION Right 10/2015  . ESOPHAGEAL DILATION    . EYE SURGERY     OD  . JOINT REPLACEMENT    . Repair of Chiari Malformation  02/05/13  . SUBOCCIPITAL CRANIECTOMY CERVICAL LAMINECTOMY  02/13/2012   Procedure: SUBOCCIPITAL CRANIECTOMY CERVICAL LAMINECTOMY/DURAPLASTY;  Surgeon: Ophelia Charter, MD;  Location: [facility];  Service: Neurosurgery;  Laterality: N/A;  Suboccipital craniectomy with upper cervical laminectomy and duraplasty  . TOTAL KNEE ARTHROPLASTY Left 11/04/2018  . TOTAL KNEE ARTHROPLASTY Left 11/04/2018   Procedure: LEFT TOTAL KNEE ARTHROPLASTY;  Surgeon: Garald Balding, MD;  Location: [facility];  Service: Orthopedics;  Laterality: Left;  . TUBAL LIGATION  1987   Social History   Occupational History  . Not on file  Tobacco Use  . Smoking status: Never Smoker  . Smokeless tobacco: Never Used  . Tobacco comment: she smoked for a few months in college  Substance and Sexual Activity  . Alcohol use: Yes    Comment: 1 glass of wine monthly  . Drug use: No    Comment: Smoked in college ~ 2 years  . Sexual activity: Not on file     Garald Balding, MD   Note - This record has been created using [employer].  Chart creation errors have been sought, but may not always  have been located. Such creation errors do not reflect on  the standard of medical care.

## 2018-12-30 ENCOUNTER — Ambulatory Visit (INDEPENDENT_AMBULATORY_CARE_PROVIDER_SITE_OTHER): Payer: Medicare Other | Admitting: Orthopaedic Surgery

## 2018-12-30 ENCOUNTER — Encounter (INDEPENDENT_AMBULATORY_CARE_PROVIDER_SITE_OTHER): Payer: Self-pay | Admitting: Orthopaedic Surgery

## 2018-12-30 VITALS — BP 125/61 | HR 83 | Ht 60.0 in | Wt 107.0 lb

## 2018-12-30 DIAGNOSIS — M170 Bilateral primary osteoarthritis of knee: Secondary | ICD-10-CM | POA: Diagnosis not present

## 2018-12-30 DIAGNOSIS — Z96652 Presence of left artificial knee joint: Secondary | ICD-10-CM

## 2018-12-30 MED ORDER — LIDOCAINE HCL 1 % IJ SOLN
2.0000 mL | INTRAMUSCULAR | Status: AC | PRN
  Administered 2018-12-30: 2 mL

## 2018-12-30 MED ORDER — BUPIVACAINE HCL 0.5 % IJ SOLN
2.0000 mL | INTRAMUSCULAR | Status: AC | PRN
  Administered 2018-12-30: 2 mL via INTRA_ARTICULAR

## 2018-12-30 MED ORDER — METHYLPREDNISOLONE ACETATE 40 MG/ML IJ SUSP
80.0000 mg | INTRAMUSCULAR | Status: AC | PRN
  Administered 2018-12-30: 80 mg via INTRA_ARTICULAR

## 2018-12-30 NOTE — Progress Notes (Signed)
Office Visit Note   Patient: Cindy Arias           Date of Birth: 06-22-1946           MRN: [id number] Visit Date: 12/30/2018              Requested by: Cari Caraway, [place], [place] [address] PCP: Cari Caraway, MD   Assessment & Plan: Visit Diagnoses:  1. History of left knee replacement   2. Bilateral primary osteoarthritis of knee     Plan: 2 months status post primary left total knee replacement doing exceptionally well without any problems.  Still uses a cane and completing a course of physical therapy for strengthening.  Has gained 120 degrees of flexion.  Having a "flareup" of the arthritis in her right knee.  Will inject with cortisone  Follow-Up Instructions: Return in about 3 months (around 04/01/2019).   Orders:  Orders Placed This Encounter  Procedures  . Large Joint Inj: R knee   No orders of the defined types were placed in this encounter.     Procedures: Large Joint Inj: R knee on 12/30/2018 1:33 PM Indications: pain and diagnostic evaluation Details: 25 G 1.5 in needle, anteromedial approach  Arthrogram: No  Medications: 2 mL lidocaine 1 %; 2 mL bupivacaine 0.5 %; 80 mg methylPREDNISolone acetate 40 MG/ML Procedure, treatment alternatives, risks and benefits explained, specific risks discussed. Consent was given by the patient. Immediately prior to procedure a time out was called to verify the correct patient, procedure, equipment, support staff and site/side marked as required. Patient was prepped and draped in the usual sterile fashion.       Clinical Data: No additional findings.   Subjective: Chief Complaint  Patient presents with  . Left Knee - Routine Post Op    Left TKA 11/04/18  Patient presents today for a four week follow up. She had left knee arthroplasty on 11/04/2018. She said that her left knee is doing great. She has been going to therapy twice weekly. She states that her right knee is bothering her now. She  said that it wakes her at night. She had x-rays on her right knee in March of 2019. She takes tylenol at night before going to bed. She has questions about the aspirin she has been taking since surgery.  HPI  Review of Systems   Objective: Vital Signs: BP 125/61   Pulse 83   Ht 5' (1.524 m)   Wt 107 lb (48.5 kg)   LMP 02/13/2012   BMI 20.90 kg/m   Physical Exam  Ortho Exam left knee incision healing without a problem.  No effusion left knee.  No opening with varus valgus stress.  Negative anterior drawer sign.  Full extension and flexed about 120 degrees.  No distal edema.  Neurovascular exam intact.  Right knee without effusion but increased varus and predominant medial joint pain.  Might lack a few degrees to full extension.  Prior films demonstrate significant osteoarthritis  Specialty Comments:  No specialty comments available.  Imaging: No results found.   PMFS History: Patient Active Problem List   Diagnosis Date Noted  . Primary osteoarthritis of left knee 11/04/2018  . History of left knee replacement 11/04/2018  . Bilateral primary osteoarthritis of knee 02/21/2018  . 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 Malignant neoplasm of overlapping sites of right breast in female, estrogen receptor positive ([place]) 11/19/2016  Past Medical History:  Diagnosis Date  . Arthritis    "bone on bone'  left knee  . Asthma    LAST FLARE UP 3 YRS AGO  . Breast cancer ([place])    RIGHT BREAST  . Colitis   . Dysphagia   . Dyspnea    NONE IN 3 YRS  . Esophageal stricture   . GERD (gastroesophageal reflux disease)   . History of infertility   . History of radiation therapy 12/31/16- 01/21/17   Right Breast 42.56 Gy in 16 fractions  . Osteopenia   . Pneumonia   . Rosacea   . Ulcerative colitis ([place])     Family History  Problem Relation Age of Onset  . Cancer Mother        Liver- 2003 Lymphoma  . Rheum arthritis  Mother   . Stroke Father   . Diabetes Maternal Grandfather        Type 2   . Heart disease Maternal Grandfather   . Hypertension Maternal Grandmother   . Heart disease Maternal Grandmother   . Osteoporosis Maternal Grandmother   . Anesthesia problems Neg Hx     Past Surgical History:  Procedure Laterality Date  . BREAST LUMPECTOMY WITH RADIOACTIVE SEED AND SENTINEL LYMPH NODE BIOPSY Right 11/21/2016   Procedure: BREAST LUMPECTOMY WITH RADIOACTIVE SEED AND SENTINEL LYMPH NODE BIOPSY;  Surgeon: Excell Seltzer, MD;  Location: [facility];  Service: General;  Laterality: Right;  . BREAST SURGERY     RIGHT BREAST LUMPECTOMY  . BUNIONECTOMY WITH HAMMERTOE RECONSTRUCTION Left 07/2014  . CATARACT EXTRACTION Right 10/2015  . ESOPHAGEAL DILATION    . EYE SURGERY     OD  . JOINT REPLACEMENT    . Repair of Chiari Malformation  02/05/13  . SUBOCCIPITAL CRANIECTOMY CERVICAL LAMINECTOMY  02/13/2012   Procedure: SUBOCCIPITAL CRANIECTOMY CERVICAL LAMINECTOMY/DURAPLASTY;  Surgeon: Ophelia Charter, MD;  Location: [facility];  Service: Neurosurgery;  Laterality: N/A;  Suboccipital craniectomy with upper cervical laminectomy and duraplasty  . TOTAL KNEE ARTHROPLASTY Left 11/04/2018  . TOTAL KNEE ARTHROPLASTY Left 11/04/2018   Procedure: LEFT TOTAL KNEE ARTHROPLASTY;  Surgeon: Garald Balding, MD;  Location: [facility];  Service: Orthopedics;  Laterality: Left;  . TUBAL LIGATION  1987   Social History   Occupational History  . Not on file  Tobacco Use  . Smoking status: Never Smoker  . Smokeless tobacco: Never Used  . Tobacco comment: she smoked for a few months in college  Substance and Sexual Activity  . Alcohol use: Yes    Comment: 1 glass of wine monthly  . Drug use: No    Comment: Smoked in college ~ 2 years  . Sexual activity: Not on file

## 2018-12-31 ENCOUNTER — Ambulatory Visit (INDEPENDENT_AMBULATORY_CARE_PROVIDER_SITE_OTHER): Payer: Medicare Other | Admitting: Orthopaedic Surgery

## 2019-01-05 ENCOUNTER — Other Ambulatory Visit: Payer: Self-pay | Admitting: Oncology

## 2019-01-21 ENCOUNTER — Telehealth: Payer: Self-pay | Admitting: Oncology

## 2019-01-21 NOTE — Telephone Encounter (Signed)
[facility] 4/1 confirmed with patient new appointment 4/30.

## 2019-01-23 ENCOUNTER — Telehealth: Payer: Self-pay | Admitting: Oncology

## 2019-01-23 NOTE — Telephone Encounter (Signed)
Scheduled appointments per provider's request. Patient is aware of upcoming appointment.

## 2019-01-28 ENCOUNTER — Ambulatory Visit: Payer: Medicare Other | Admitting: Oncology

## 2019-01-28 ENCOUNTER — Other Ambulatory Visit: Payer: Medicare Other

## 2019-01-29 ENCOUNTER — Ambulatory Visit: Payer: Medicare Other | Admitting: Oncology

## 2019-01-29 ENCOUNTER — Other Ambulatory Visit: Payer: Medicare Other

## 2019-02-23 ENCOUNTER — Telehealth: Payer: Self-pay | Admitting: Oncology

## 2019-02-23 NOTE — Telephone Encounter (Signed)
Called pt re Webex - patient wanted to reschedule it for June.

## 2019-02-26 ENCOUNTER — Other Ambulatory Visit: Payer: Medicare Other

## 2019-02-26 ENCOUNTER — Ambulatory Visit: Payer: Medicare Other | Admitting: Oncology

## 2019-03-10 ENCOUNTER — Other Ambulatory Visit: Payer: Self-pay | Admitting: Oncology

## 2019-03-11 ENCOUNTER — Other Ambulatory Visit: Payer: Self-pay | Admitting: Oncology

## 2019-04-08 ENCOUNTER — Ambulatory Visit (INDEPENDENT_AMBULATORY_CARE_PROVIDER_SITE_OTHER): Payer: Medicare Other | Admitting: Orthopaedic Surgery

## 2019-04-08 ENCOUNTER — Encounter: Payer: Self-pay | Admitting: Orthopaedic Surgery

## 2019-04-08 ENCOUNTER — Other Ambulatory Visit: Payer: Self-pay

## 2019-04-08 VITALS — BP 123/57 | HR 72 | Ht 60.0 in | Wt 110.0 lb

## 2019-04-08 DIAGNOSIS — M1712 Unilateral primary osteoarthritis, left knee: Secondary | ICD-10-CM | POA: Diagnosis not present

## 2019-04-08 DIAGNOSIS — Z96652 Presence of left artificial knee joint: Secondary | ICD-10-CM

## 2019-04-08 MED ORDER — CLINDAMYCIN HCL 300 MG PO CAPS: ORAL_CAPSULE | ORAL | 1 refills | Status: DC

## 2019-04-08 NOTE — Progress Notes (Signed)
Office Visit Note   Patient: Cindy Arias           Date of Birth: 20-Apr-1946           MRN: [id number] Visit Date: 04/08/2019              Requested by: Cari Caraway, [place], [place] [address] PCP: Cari Caraway, MD   Assessment & Plan: Visit Diagnoses:  1. Primary osteoarthritis of left knee   2. History of left knee replacement     Plan: 30-monthstatus post primary left total knee replacement doing quite well.  Walking 4 to 5 miles a day and exercising for strength.  Still has a little difficulty coming down stairs leading with her left leg.  Not taking anything for pain.  Continue with the strengthening exercises a plan to check her back in 6 months.  Follow-Up Instructions: Return in about 6 months (around 10/08/2019).   Orders:  No orders of the defined types were placed in this encounter.  No orders of the defined types were placed in this encounter.     Procedures: No procedures performed   Clinical Data: No additional findings.   Subjective: Chief Complaint  Patient presents with  . Left Knee - Follow-up  . Right Knee - Follow-up  Patient presents today for a three month follow up. She had a right knee cortisone injection on 12/30/18. She has a history of a left total knee arthroplasty 5 months ago, on 11/04/18. Patient states that both knees are doing well. She is going to therapy twice weekly for her left for stiffness.   HPI  Review of Systems   Objective: Vital Signs: BP (!) 123/57   Pulse 72   Ht 5' (1.524 m)   Wt 110 lb (49.9 kg)   LMP 02/13/2012   BMI 21.48 kg/m   Physical Exam Constitutional:      Appearance: She is well-developed.  Eyes:     Pupils: Pupils are equal, round, and reactive to light.  Pulmonary:     Effort: Pulmonary effort is normal.  Skin:    General: Skin is warm and dry.  Neurological:     Mental Status: She is alert and oriented to person, place, and time.  Psychiatric:      Behavior: Behavior normal.     Ortho Exam awake alert and oriented x3.  Comfortable sitting.  Does not use any type of ambulatory aid.  Walks without a limp.  Lacks just a few degrees to full extension left knee but full extension is perceived.  Flexed over 120 degrees.  Opens a little laterally with a varus stress only several millimeters.  Stable medially.  Negative anterior drawer sign.  No effusion.  No calf pain or distal edema  Specialty Comments:  No specialty comments available.  Imaging: No results found.   PMFS History: Patient Active Problem List   Diagnosis Date Noted  . Primary osteoarthritis of left knee 11/04/2018  . History of left knee replacement 11/04/2018  . Bilateral primary osteoarthritis of knee 02/21/2018  . Carcinoma of central portion of right breast in [place], [place] receptor positive [place] 12/21/2016  . Malignant neoplasm of overlapping sites of right breast in female, estrogen receptor positive ([facility] 11/19/2016   Past Medical History:  Diagnosis Date  . Arthritis    "bone on bone'  left knee  . Asthma    LAST FLARE UP 3 YRS AGO  . Breast cancer ([place]  RIGHT BREAST  . Colitis   . Dysphagia   . Dyspnea    NONE IN 3 YRS  . Esophageal stricture   . GERD (gastroesophageal reflux disease)   . History of infertility   . History of radiation therapy 12/31/16- 01/21/17   Right Breast 42.56 Gy in 16 fractions  . Osteopenia   . Pneumonia   . Rosacea   . Ulcerative colitis ([place])     Family History  Problem Relation Age of Onset  . Cancer Mother        Liver- 2003 Lymphoma  . Rheum arthritis Mother   . Stroke Father   . Diabetes Maternal Grandfather        Type 2   . Heart disease Maternal Grandfather   . Hypertension Maternal Grandmother   . Heart disease Maternal Grandmother   . Osteoporosis Maternal Grandmother   . Anesthesia problems Neg Hx     Past Surgical History:  Procedure Laterality Date  . BREAST LUMPECTOMY WITH RADIOACTIVE  SEED AND SENTINEL LYMPH NODE BIOPSY Right 11/21/2016   Procedure: BREAST LUMPECTOMY WITH RADIOACTIVE SEED AND SENTINEL LYMPH NODE BIOPSY;  Surgeon: Excell Seltzer, MD;  Location: [facility];  Service: General;  Laterality: Right;  . BREAST SURGERY     RIGHT BREAST LUMPECTOMY  . BUNIONECTOMY WITH HAMMERTOE RECONSTRUCTION Left 07/2014  . CATARACT EXTRACTION Right 10/2015  . ESOPHAGEAL DILATION    . EYE SURGERY     OD  . JOINT REPLACEMENT    . Repair of Chiari Malformation  02/05/13  . SUBOCCIPITAL CRANIECTOMY CERVICAL LAMINECTOMY  02/13/2012   Procedure: SUBOCCIPITAL CRANIECTOMY CERVICAL LAMINECTOMY/DURAPLASTY;  Surgeon: Ophelia Charter, MD;  Location: [facility]S;  Service: Neurosurgery;  Laterality: N/A;  Suboccipital craniectomy with upper cervical laminectomy and duraplasty  . TOTAL KNEE ARTHROPLASTY Left 11/04/2018  . TOTAL KNEE ARTHROPLASTY Left 11/04/2018   Procedure: LEFT TOTAL KNEE ARTHROPLASTY;  Surgeon: Garald Balding, MD;  Location: [facility];  Service: Orthopedics;  Laterality: Left;  . TUBAL LIGATION  1987   Social History   Occupational History  . Not on file  Tobacco Use  . Smoking status: Never Smoker  . Smokeless tobacco: Never Used  . Tobacco comment: she smoked for a few months in college  Substance and Sexual Activity  . Alcohol use: Yes    Comment: 1 glass of wine monthly  . Drug use: No    Comment: Smoked in college ~ 2 years  . Sexual activity: Not on file

## 2019-04-08 NOTE — Addendum Note (Signed)
Addended by: Lendon Collar on: 04/08/2019 03:30 PM   Modules accepted: Orders

## 2019-04-27 ENCOUNTER — Telehealth: Payer: Self-pay | Admitting: Oncology

## 2019-04-27 NOTE — Telephone Encounter (Signed)
Called patient regarding upcoming Webex appointment, patient would like this appointment to be cancelled and will call back when ready to reschedule.

## 2019-04-28 ENCOUNTER — Other Ambulatory Visit: Payer: Medicare Other

## 2019-04-28 ENCOUNTER — Ambulatory Visit: Payer: Medicare Other | Admitting: Oncology

## 2019-05-15 ENCOUNTER — Telehealth: Payer: Self-pay | Admitting: Orthopaedic Surgery

## 2019-05-15 ENCOUNTER — Other Ambulatory Visit: Payer: Self-pay | Admitting: *Deleted

## 2019-05-15 MED ORDER — CLINDAMYCIN HCL 300 MG PO CAPS: ORAL_CAPSULE | ORAL | 1 refills | Status: DC

## 2019-05-15 NOTE — Telephone Encounter (Signed)
Patient left a voicemail requesting prescription refill of Clindamycin to be sent to [facility] for her upcoming appointment with the dentist for a filling.

## 2019-05-15 NOTE — Telephone Encounter (Signed)
[facility] RX sent

## 2019-07-04 ENCOUNTER — Other Ambulatory Visit: Payer: Self-pay | Admitting: Oncology

## 2019-07-04 ENCOUNTER — Other Ambulatory Visit: Payer: Self-pay | Admitting: Allergy and Immunology

## 2019-07-28 ENCOUNTER — Ambulatory Visit (INDEPENDENT_AMBULATORY_CARE_PROVIDER_SITE_OTHER): Payer: Medicare Other | Admitting: Allergy and Immunology

## 2019-07-28 ENCOUNTER — Encounter: Payer: Self-pay | Admitting: Allergy and Immunology

## 2019-07-28 ENCOUNTER — Other Ambulatory Visit: Payer: Self-pay

## 2019-07-28 VITALS — BP 118/68 | HR 76 | Temp 97.8°F | Resp 16 | Ht <= 58 in | Wt 115.6 lb

## 2019-07-28 DIAGNOSIS — J4540 Moderate persistent asthma, uncomplicated: Secondary | ICD-10-CM

## 2019-07-28 DIAGNOSIS — K219 Gastro-esophageal reflux disease without esophagitis: Secondary | ICD-10-CM

## 2019-07-28 DIAGNOSIS — J3089 Other allergic rhinitis: Secondary | ICD-10-CM | POA: Diagnosis not present

## 2019-07-28 NOTE — Progress Notes (Signed)
[facility] [place] [facility] [place]   Follow-up Note  Referring Provider: Cari Caraway, MD Primary Provider: Cari Caraway, MD Date of Office Visit: 07/28/2019  Subjective:   Cindy Arias (DOB: 11/10/1945) is a 73 y.o. female who returns to the [facility] on 07/28/2019 in re-evaluation of the following:  HPI: Cindy Arias returns to this clinic in evaluation of asthma and allergic rhinitis and LPR.  His last visit to this clinic was 20 May 2018.  Overall she has had an excellent year without any significant problems.  She has been consistently using her Symbicort 1 time per day and rarely uses a short acting bronchodilator.  She is walking 5 miles per day with no problem at all.  She has not required a systemic steroid to treat an exacerbation.  She has not really had any significant issues with her upper airway.  Her reflux is under excellent control while using omeprazole mostly 1 time per day.  On a rare occasion she will add in other therapy including a proton pump inhibitor twice a day and a H2 receptor blocker in evening.  Allergies as of 07/28/2019      Reactions   Penicillins Hives, Other (See Comments)   UNSPECIFIED CHILDHOOD REACTION  Had reaction as a child and as an adult. Had additional reaction but not clear on the specifics   Sulfa Antibiotics Hives   Albuterol Other (See Comments)   UNSPECIFIED REACTION  States she cannot tolerate albuterol without side effects      Medication List      anastrozole 1 MG tablet Commonly known as: ARIMIDEX TAKE 1 TABLET ONCE DAILY.   aspirin 325 MG EC tablet Take 1 tablet (325 mg total) by mouth daily with breakfast.   Boniva 150 MG tablet Generic drug: ibandronate Take 1 tablet (150 mg total) by mouth every 30 (thirty) days. Take in the morning with a full glass of water, on an empty stomach, and do not take anything else by mouth or lie down for the next 30 min.     budesonide-formoterol 160-4.5 MCG/ACT inhaler Commonly known as: SYMBICORT Inhale 2 puffs into the lungs 2 (two) times daily.   CALCIUM CITRATE-VITAMIN D PO Take 1 tablet by mouth daily.   clindamycin 300 MG capsule Commonly known as: CLEOCIN Take 2 tablets one hour prior to dental procedure.   fluticasone 50 MCG/ACT nasal spray Commonly known as: FLONASE Place 2 sprays into both nostrils daily as needed for allergies or rhinitis.   levalbuterol 45 MCG/ACT inhaler Commonly known as: XOPENEX HFA Inhale two puffs every 4-6 hours if needed for cough or wheeze What changed:   how much to take  how to take this  when to take this  reasons to take this  additional instructions   mesalamine 1.2 g EC tablet Commonly known as: LIALDA Take 1.2 g by mouth daily with breakfast.   multivitamins with iron Tabs tablet Take 1 tablet by mouth daily.   omeprazole 40 MG capsule Commonly known as: PRILOSEC Take one capsule twice daily as directed What changed:   how much to take  how to take this  when to take this  additional instructions   sodium chloride 0.65 % nasal spray Commonly known as: OCEAN Place 1 spray into the nose daily as needed for congestion.   Vitamin D 50 MCG (2000 UT) Caps Take 2,000 Units by mouth daily.   vitamin E 400 UNIT capsule Take 400  Units by mouth daily.       Past Medical History:  Diagnosis Date   Arthritis    "bone on bone'  left knee   Asthma    LAST FLARE UP 3 YRS AGO   Breast cancer ([place])    RIGHT BREAST   Colitis    Dysphagia    Dyspnea    NONE IN 3 YRS   Esophageal stricture    GERD (gastroesophageal reflux disease)    History of infertility    History of radiation therapy 12/31/16- 01/21/17   Right Breast 42.56 Gy in 16 fractions   Osteopenia    Pneumonia    Rosacea    Ulcerative colitis ([place])     Past Surgical History:  Procedure Laterality Date   BREAST LUMPECTOMY WITH RADIOACTIVE SEED AND  SENTINEL LYMPH NODE BIOPSY Right 11/21/2016   Procedure: BREAST LUMPECTOMY WITH RADIOACTIVE SEED AND SENTINEL LYMPH NODE BIOPSY;  Surgeon: Excell Seltzer, MD;  Location: [facility];  Service: General;  Laterality: Right;   BREAST SURGERY     RIGHT BREAST LUMPECTOMY   BUNIONECTOMY WITH HAMMERTOE RECONSTRUCTION Left 07/2014   CATARACT EXTRACTION Right 10/2015   ESOPHAGEAL DILATION     EYE SURGERY     OD   JOINT REPLACEMENT     Repair of Chiari Malformation  02/05/13   SUBOCCIPITAL CRANIECTOMY CERVICAL LAMINECTOMY  02/13/2012   Procedure: SUBOCCIPITAL CRANIECTOMY CERVICAL LAMINECTOMY/DURAPLASTY;  Surgeon: Ophelia Charter, MD;  Location: [facility];  Service: Neurosurgery;  Laterality: N/A;  Suboccipital craniectomy with upper cervical laminectomy and duraplasty   TOTAL KNEE ARTHROPLASTY Left 11/04/2018   TOTAL KNEE ARTHROPLASTY Left 11/04/2018   Procedure: LEFT TOTAL KNEE ARTHROPLASTY;  Surgeon: Garald Balding, MD;  Location: [facility];  Service: Orthopedics;  Laterality: Left;   TUBAL LIGATION  1987    Review of systems negative except as noted in HPI / PMHx or noted below:  Review of Systems  Constitutional: Negative.   HENT: Negative.   Eyes: Negative.   Respiratory: Negative.   Cardiovascular: Negative.   Gastrointestinal: Negative.   Genitourinary: Negative.   Musculoskeletal: Negative.   Skin: Negative.   Neurological: Negative.   Endo/Heme/Allergies: Negative.   Psychiatric/Behavioral: Negative.      Objective:   Vitals:   07/28/19 1441  BP: 118/68  Pulse: 76  Resp: 16  Temp: 97.8 F (36.6 C)  SpO2: 98%   Height: 4' 9.5" (146.1 cm)  Weight: 115 lb 9.6 oz (52.4 kg)   Physical Exam Constitutional:      Appearance: She is not diaphoretic.  HENT:     Head: Normocephalic.     Right Ear: Tympanic membrane, ear canal and external ear normal.     Left Ear: Tympanic membrane, ear canal and external ear normal.     Nose: Nose normal. No  mucosal edema or rhinorrhea.     Mouth/Throat:     Pharynx: Uvula midline. No oropharyngeal exudate.  Eyes:     Conjunctiva/sclera: Conjunctivae normal.  Neck:     Thyroid: No thyromegaly.     Trachea: Trachea normal. No tracheal tenderness or tracheal deviation.  Cardiovascular:     Rate and Rhythm: Normal rate and regular rhythm.     Heart sounds: Normal heart sounds, S1 normal and S2 normal. No murmur.  Pulmonary:     Effort: No respiratory distress.     Breath sounds: Normal breath sounds. No stridor. No wheezing or rales.  Lymphadenopathy:     Head:  Right side of head: No tonsillar adenopathy.     Left side of head: No tonsillar adenopathy.     Cervical: No cervical adenopathy.  Skin:    Findings: No erythema or rash.     Nails: There is no clubbing.   Neurological:     Mental Status: She is alert.     Diagnostics:    Spirometry was performed and demonstrated an FEV1 of 1.71 at 102 % of predicted.  Assessment and Plan:   1. Asthma, moderate persistent, well-controlled   2. Other allergic rhinitis   3. LPRD (laryngopharyngeal reflux disease)     1. Treat inflammation:   A. Symbicort 160-2 inhalations inhalations 1-2 times a day  2. Continue treatment for reflux:   A. Omeprazole 40 mg 1-2 times a day  B. Add famotidine 40 mg in evening while "sick"  3. Continue the following if needed:   A. antihistamine   B. Nasal saline  C. Mucinex DM  D. Xopenx HFA  4. Return to clinic in 1 year or earlier if problem   5.  Mead appears to be doing very well on her current therapy.  I see no need for change in her treatment at this point in time.  She will continue on anti-inflammatory agents for airway and therapy directed against reflux and I will see her back in this clinic in 1 year or earlier if there is a problem.  Allena Katz, MD Allergy / Immunology [place]

## 2019-07-28 NOTE — Patient Instructions (Addendum)
  1. Treat inflammation:   A. Symbicort 160-2 inhalations inhalations 1-2 times a day  2. Continue treatment for reflux:   A. Omeprazole 40 mg 1-2 times a day  B. Add famotidine 40 mg in evening while "sick"  3. Continue the following if needed:   A. antihistamine   B. Nasal saline  C. Mucinex DM  D. Xopenx HFA  4. Return to clinic in 1 year or earlier if problem   5.  Obtain  COVID vaccine

## 2019-07-29 ENCOUNTER — Encounter: Payer: Self-pay | Admitting: Allergy and Immunology

## 2019-08-03 ENCOUNTER — Other Ambulatory Visit: Payer: Self-pay | Admitting: Allergy and Immunology

## 2019-08-24 NOTE — Progress Notes (Signed)
[place]  [phone] [phone]     ID: Cindy Arias DOB: [birth year]  MR#: [id number]  ZHY#:[id number]  Patient Care Team: Cindy Caraway, MD as PCP - General (Family Medicine) Cindy Arias, Cindy Dad, MD as Consulting Physician (Oncology) Cindy Seltzer, MD as Consulting Physician (General Surgery) Cindy Gibson, MD as Attending Physician (Radiation Oncology) Cindy Mutter, MD as Consulting Physician (Ophthalmology) Cindy Arias, Cindy Poag, MD as Consulting Physician (Allergy and Immunology) Cindy Silence, MD as Consulting Physician (Gastroenterology) Cindy Arias, Cindy Massed, NP as Nurse Practitioner (Hematology and Oncology) Cindy Balding, MD as Consulting Physician (Orthopedic Surgery) OTHER MD:  CHIEF COMPLAINT: Estrogen receptor positive breast cancer  CURRENT TREATMENT: Anastrozole   INTERVAL HISTORY: Cindy Arias returns today for follow-up of her estrogen receptor positive breast cancer.   She continues on anastrozole.  She tolerates this well, with no unusual side effects.  Since her last visit, she underwent bilateral diagnostic mammography with tomography at [facility] on 01/01/2019 showing: breast density category B; no evidence of malignancy in either breast.  Her most recent bone density scan from 12/24/2017 showed osteopenia. She was prescribed ibandronate by her PCP. She continues this with good tolerance.   She has been followed by Dr. Durward Arias in orthopedics for bilateral knee oseoarthritis. She underwent left knee arthroplasty on 11/04/2018.   REVIEW OF SYSTEMS: Cindy Arias was very upset because she had to wait quite a bit out from but no one came out to tell her that we were running late.  There was some delay with the parking as well.  Otherwise she is doing terrific.  She had knee replacement on the left under wet-field.  She is walking about 2 miles every day for rehab.  She is very careful with the coronavirus and taking  appropriate pandemic precautions.  A detailed review of systems today was otherwise stable.  BREAST CANCER HISTORY: From the original intake note:  Cindy Arias had bilateral screening mammography with tomography at [facility] 10/04/2016 showing a possible new mass in the right breast central to the nipple. The breast density was category B. On 10/11/2016 she underwent ultrasonography of the right breast and axilla. This confirmed a 0.9 cm irregular mass in the central breast correlating with the mammography findings. The right axilla was sonographically benign.   On 10/17/2016 she underwent biopsy of the right breast mass in question, showing ([id number]) invasive ductal carcinoma, grade 2, estrogen receptor 100% positive, progesterone receptor 95% positive, both with strong staining intensity, with an MIB-1 of 10%, and no HER-2 amplification, the signals ratio 1.19 and the number per cell 1.90.  The patient's subsequent history is as detailed below   PAST MEDICAL HISTORY: Past Medical History:  Diagnosis Date  . Arthritis    "bone on bone'  left knee  . Asthma    LAST FLARE UP 3 YRS AGO  . Breast cancer ([place])    RIGHT BREAST  . Colitis   . Dysphagia   . Dyspnea    NONE IN 3 YRS  . Esophageal stricture   . GERD (gastroesophageal reflux disease)   . History of infertility   . History of radiation therapy 12/31/16- 01/21/17   Right Breast 42.56 Gy in 16 fractions  . Osteopenia   . Pneumonia   . Rosacea   . Ulcerative colitis ([place])     PAST SURGICAL HISTORY: Past Surgical History:  Procedure Laterality Date  . BREAST LUMPECTOMY WITH RADIOACTIVE SEED AND SENTINEL LYMPH NODE BIOPSY Right 11/21/2016   Procedure: BREAST LUMPECTOMY  WITH RADIOACTIVE SEED AND SENTINEL LYMPH NODE BIOPSY;  Surgeon: Cindy Seltzer, MD;  Location: [facility];  Service: General;  Laterality: Right;  . BREAST SURGERY     RIGHT BREAST LUMPECTOMY  . BUNIONECTOMY WITH HAMMERTOE RECONSTRUCTION Left  07/2014  . CATARACT EXTRACTION Right 10/2015  . ESOPHAGEAL DILATION    . EYE SURGERY     OD  . JOINT REPLACEMENT    . Repair of Chiari Malformation  02/05/13  . SUBOCCIPITAL CRANIECTOMY CERVICAL LAMINECTOMY  02/13/2012   Procedure: SUBOCCIPITAL CRANIECTOMY CERVICAL LAMINECTOMY/DURAPLASTY;  Surgeon: Ophelia Charter, MD;  Location: [place] [facility];  Service: Neurosurgery;  Laterality: N/A;  Suboccipital craniectomy with upper cervical laminectomy and duraplasty  . TOTAL KNEE ARTHROPLASTY Left 11/04/2018  . TOTAL KNEE ARTHROPLASTY Left 11/04/2018   Procedure: LEFT TOTAL KNEE ARTHROPLASTY;  Surgeon: Cindy Balding, MD;  Location: [facility];  Service: Orthopedics;  Laterality: Left;  . TUBAL LIGATION  1987    FAMILY HISTORY Family History  Problem Relation Age of Onset  . Cancer Mother        Liver- 2003 Lymphoma  . Rheum arthritis Mother   . Stroke Father   . Diabetes Maternal Grandfather        Type 2   . Heart disease Maternal Grandfather   . Hypertension Maternal Grandmother   . Heart disease Maternal Grandmother   . Osteoporosis Maternal Grandmother   . Anesthesia problems Neg Hx   The patient's father died at the age of 11 FOLLOWING his stroke. The patient's mother died of non-Hodgkin's lymphoma at the age of 19. The patient had no brothers, 1 sister. There is no history of breast or ovarian cancer in the family   GYNECOLOGIC HISTORY:  Patient's last menstrual period was 02/13/2012. Menarche age 37, first live birth age 24. The patient is GX P1. She went through the change of life in her 37s. She did not take hormone replacement.   SOCIAL HISTORY:  [name] work for [employer], for the [employer] agency in for [employer]. She is now retired and does a lot of volunteering. Her husband Cindy Arias is a retired Optometrist. They have been married 72 years as of January 2018. Their daughter Cindy Arias teaches special education in [place]. The patient has one  grandchild. She attends [employer]: Not in place   HEALTH MAINTENANCE: Social History   Tobacco Use  . Smoking status: Never Smoker  . Smokeless tobacco: Never Used  . Tobacco comment: she smoked for a few months in college  Substance Use Topics  . Alcohol use: Yes    Comment: 1 glass of wine monthly  . Drug use: No    Comment: Smoked in college ~ 2 years     Colonoscopy: Due 2019  PAP: Up-to-date  Bone density: mild osteopenia   Allergies  Allergen Reactions  . Penicillins Hives and Other (See Comments)    UNSPECIFIED CHILDHOOD REACTION  Had reaction as a child and as an adult. Had additional reaction but not clear on the specifics  . Sulfa Antibiotics Hives  . Albuterol Other (See Comments)    UNSPECIFIED REACTION  States she cannot tolerate albuterol without side effects    Current Outpatient Medications  Medication Sig Dispense Refill  . anastrozole (ARIMIDEX) 1 MG tablet Take 1 tablet (1 mg total) by mouth daily. 90 tablet 4  . aspirin EC 325 MG EC tablet Take 1 tablet (325 mg total)  by mouth daily with breakfast. 30 tablet 0  . budesonide-formoterol (SYMBICORT) 160-4.5 MCG/ACT inhaler Inhale 2 puffs into the lungs 2 (two) times daily. 1 Inhaler 5  . CALCIUM CITRATE-VITAMIN D PO Take 1 tablet by mouth daily.     . Cholecalciferol (VITAMIN D) 50 MCG (2000 UT) CAPS Take 2,000 Units by mouth daily.     . clindamycin (CLEOCIN) 300 MG capsule Take 2 tablets one hour prior to dental procedure. 2 capsule 1  . fluticasone (FLONASE) 50 MCG/ACT nasal spray Place 2 sprays into both nostrils daily as needed for allergies or rhinitis.    Marland Kitchen ibandronate (BONIVA) 150 MG tablet Take 1 tablet (150 mg total) by mouth every 30 (thirty) days. Take in the morning with a full glass of water, on an empty stomach, and do not take anything else by mouth or lie down for the next 30 min.    . levalbuterol (XOPENEX HFA) 45 MCG/ACT inhaler Inhale two puffs every 4-6  hours if needed for cough or wheeze (Patient taking differently: Inhale 1 puff into the lungs every 6 (six) hours as needed for wheezing or shortness of breath. ) 1 Inhaler 3  . mesalamine (LIALDA) 1.2 G EC tablet Take 1.2 g by mouth daily with breakfast.     . Multiple Vitamins-Iron (MULTIVITAMINS WITH IRON) TABS Take 1 tablet by mouth daily.    Marland Kitchen omeprazole (PRILOSEC) 40 MG capsule Take 1 capsule (40 mg total) by mouth daily. 60 capsule 5  . sodium chloride (OCEAN) 0.65 % nasal spray Place 1 spray into the nose daily as needed for congestion.     . vitamin E 400 UNIT capsule Take 400 Units by mouth daily.     Current Facility-Administered Medications  Medication Dose Route Frequency Provider Last Rate Last Dose  . lidocaine (XYLOCAINE) 1 % (with pres) injection 3 mL  3 mL Infiltration Once Hilts, Michael, MD        OBJECTIVE: Middle-aged white woman who appears stated age  Vitals:   08/25/19 1110  BP: 136/66  Pulse: 63  Resp: 18  Temp: 98.2 F (36.8 C)  SpO2: 100%     Body mass index is 24.39 kg/m.    ECOG FS:1 - Symptomatic but completely ambulatory  Sclerae unicteric, EOMs intact Wearing a mask No cervical or supraclavicular adenopathy Lungs no rales or rhonchi Heart regular rate and rhythm Abd soft, nontender, positive bowel sounds MSK no focal spinal tenderness, no upper extremity lymphedema Neuro: nonfocal, well oriented, appropriate affect Breasts: The right breast is status post lumpectomy and radiation.  The left breast is benign.  Both axillae are benign.   LAB RESULTS:  CMP     Component Value Date/Time   NA 138 08/25/2019 1004   NA 140 04/29/2017 1202   K 4.4 08/25/2019 1004   K 4.2 04/29/2017 1202   CL 104 08/25/2019 1004   CO2 24 08/25/2019 1004   CO2 25 04/29/2017 1202   GLUCOSE 94 08/25/2019 1004   GLUCOSE 97 04/29/2017 1202   BUN 17 08/25/2019 1004   BUN 19.2 04/29/2017 1202   CREATININE 0.78 08/25/2019 1004   CREATININE 0.8 04/29/2017 1202    CALCIUM 9.3 08/25/2019 1004   CALCIUM 9.8 04/29/2017 1202   PROT 7.1 08/25/2019 1004   PROT 7.3 04/29/2017 1202   ALBUMIN 4.2 08/25/2019 1004   ALBUMIN 4.1 04/29/2017 1202   AST 25 08/25/2019 1004   AST 21 04/29/2017 1202   ALT 20 08/25/2019 1004   ALT  20 04/29/2017 1202   ALKPHOS 68 08/25/2019 1004   ALKPHOS 80 04/29/2017 1202   BILITOT 0.6 08/25/2019 1004   BILITOT 0.56 04/29/2017 1202   GFRNONAA >60 08/25/2019 1004   GFRAA >60 08/25/2019 1004    INo results found for: SPEP, UPEP  Lab Results  Component Value Date   WBC 6.6 08/25/2019   NEUTROABS 3.8 08/25/2019   HGB 13.3 08/25/2019   HCT 40.8 08/25/2019   MCV 87.6 08/25/2019   PLT 270 08/25/2019      Chemistry      Component Value Date/Time   NA 138 08/25/2019 1004   NA 140 04/29/2017 1202   K 4.4 08/25/2019 1004   K 4.2 04/29/2017 1202   CL 104 08/25/2019 1004   CO2 24 08/25/2019 1004   CO2 25 04/29/2017 1202   BUN 17 08/25/2019 1004   BUN 19.2 04/29/2017 1202   CREATININE 0.78 08/25/2019 1004   CREATININE 0.8 04/29/2017 1202      Component Value Date/Time   CALCIUM 9.3 08/25/2019 1004   CALCIUM 9.8 04/29/2017 1202   ALKPHOS 68 08/25/2019 1004   ALKPHOS 80 04/29/2017 1202   AST 25 08/25/2019 1004   AST 21 04/29/2017 1202   ALT 20 08/25/2019 1004   ALT 20 04/29/2017 1202   BILITOT 0.6 08/25/2019 1004   BILITOT 0.56 04/29/2017 1202       No results found for: LABCA2  No components found for: LABCA125  No results for input(s): INR in the last 168 hours.  Urinalysis    Component Value Date/Time   COLORURINE AMBER (A) 10/27/2018 0945   APPEARANCEUR HAZY (A) 10/27/2018 0945   LABSPEC 1.026 10/27/2018 0945   PHURINE 5.0 10/27/2018 0945   GLUCOSEU NEGATIVE 10/27/2018 0945   HGBUR NEGATIVE 10/27/2018 0945   BILIRUBINUR NEGATIVE 10/27/2018 0945   KETONESUR NEGATIVE 10/27/2018 0945   PROTEINUR NEGATIVE 10/27/2018 0945   NITRITE NEGATIVE 10/27/2018 0945   LEUKOCYTESUR NEGATIVE 10/27/2018 0945     STUDIES: No results found.   ELIGIBLE FOR AVAILABLE RESEARCH PROTOCOL: no  ASSESSMENT: 73 y.o. [place] woman status post right breast overlapping sites biopsy 10/17/2016 for a clinical T1b N0, stage IA invasive ductal carcinoma, grade 2, estrogen and progesterone receptor positive, HER-2 nonamplified, with an MIB-1 of 10%  (1)  s/p right lumpectomy and sentinel lymph node sampling 11/21/2016 for aT1c pN0, stage IA invasive ductal carcinoma, grade 2, with close but negative margins  (2) Oncotype DX score of 16 predict a 10 year risk of recurrence outside the breast of 10% if the patient's only systemic therapy is tamoxifen for 5 years. It also predicts no benefit from adjuvant chemotherapy.with will be  (3) adjuvant radiation 12/31/16 - 01/21/17 Site/dose:Right breast treated to 42.56 Gy in 16 fractions  (4) anastrozole started 02/15/2017  (a) bone density at [facility] 12/24/2017 shows a T score of -2.0.  (This is unchanged from baseline).  PLAN: Cindy Arias will soon be 3 years out from definitive surgery for her breast cancer with no evidence of disease recurrence.  This is very favorable.  She is tolerating anastrozole well and the plan is to continue that a minimum of 5 years.  She is taking ibandronate for the osteopenia and tolerating that well also.  She is taking appropriate pandemic precautions  I asked her to write me a note so I can pass it onto our administration regarding her experience waiting today.  She will have her next mammogram in June and will return to see me  in a year.  She knows to call for any other issue that may develop before then.   Shantel Helwig, Cindy Dad, MD  08/25/19 11:34 AM Medical Oncology and Hematology [facility] [place], [place] [address] Tel. [phone]    Fax. [phone]    I, Wilburn Mylar, am acting as scribe for Dr. Virgie Arias. Alphonsus Doyel.  I, Lurline Del MD, have reviewed the above documentation for  accuracy and completeness, and I agree with the above.

## 2019-08-25 ENCOUNTER — Inpatient Hospital Stay: Payer: Medicare Other | Attending: Oncology | Admitting: Oncology

## 2019-08-25 ENCOUNTER — Other Ambulatory Visit: Payer: Self-pay

## 2019-08-25 ENCOUNTER — Inpatient Hospital Stay: Payer: Medicare Other

## 2019-08-25 VITALS — BP 136/66 | HR 63 | Temp 98.2°F | Resp 18 | Ht <= 58 in | Wt 114.7 lb

## 2019-08-25 DIAGNOSIS — Z800 Family history of malignant neoplasm of digestive organs: Secondary | ICD-10-CM | POA: Insufficient documentation

## 2019-08-25 DIAGNOSIS — Z807 Family history of other malignant neoplasms of lymphoid, hematopoietic and related tissues: Secondary | ICD-10-CM | POA: Diagnosis not present

## 2019-08-25 DIAGNOSIS — Z170 Estrogen receptor positive status [ER+]: Secondary | ICD-10-CM

## 2019-08-25 DIAGNOSIS — C50811 Malignant neoplasm of overlapping sites of right female breast: Secondary | ICD-10-CM | POA: Diagnosis not present

## 2019-08-25 DIAGNOSIS — Z79899 Other long term (current) drug therapy: Secondary | ICD-10-CM | POA: Insufficient documentation

## 2019-08-25 DIAGNOSIS — C50111 Malignant neoplasm of central portion of right female breast: Secondary | ICD-10-CM

## 2019-08-25 DIAGNOSIS — Z79811 Long term (current) use of aromatase inhibitors: Secondary | ICD-10-CM | POA: Diagnosis not present

## 2019-08-25 DIAGNOSIS — Z880 Allergy status to penicillin: Secondary | ICD-10-CM | POA: Insufficient documentation

## 2019-08-25 DIAGNOSIS — Z823 Family history of stroke: Secondary | ICD-10-CM | POA: Insufficient documentation

## 2019-08-25 DIAGNOSIS — M8580 Other specified disorders of bone density and structure, unspecified site: Secondary | ICD-10-CM | POA: Insufficient documentation

## 2019-08-25 DIAGNOSIS — Z8262 Family history of osteoporosis: Secondary | ICD-10-CM | POA: Insufficient documentation

## 2019-08-25 DIAGNOSIS — Z8249 Family history of ischemic heart disease and other diseases of the circulatory system: Secondary | ICD-10-CM | POA: Insufficient documentation

## 2019-08-25 DIAGNOSIS — Z882 Allergy status to sulfonamides status: Secondary | ICD-10-CM | POA: Insufficient documentation

## 2019-08-25 DIAGNOSIS — Z833 Family history of diabetes mellitus: Secondary | ICD-10-CM | POA: Insufficient documentation

## 2019-08-25 LAB — COMPREHENSIVE METABOLIC PANEL
ALT: 20 U/L (ref 0–44)
AST: 25 U/L (ref 15–41)
Albumin: 4.2 g/dL (ref 3.5–5.0)
Alkaline Phosphatase: 68 U/L (ref 38–126)
Anion gap: 10 (ref 5–15)
BUN: 17 mg/dL (ref 8–23)
CO2: 24 mmol/L (ref 22–32)
Calcium: 9.3 mg/dL (ref 8.9–10.3)
Chloride: 104 mmol/L (ref 98–111)
Creatinine, Ser: 0.78 mg/dL (ref 0.44–1.00)
GFR calc Af Amer: >60 mL/min (ref >60)
GFR calc non Af Amer: >60 mL/min (ref >60)
Glucose, Bld: 94 mg/dL (ref 70–99)
Potassium: 4.4 mmol/L (ref 3.5–5.1)
Sodium: 138 mmol/L (ref 135–145)
Total Bilirubin: 0.6 mg/dL (ref 0.3–1.2)
Total Protein: 7.1 g/dL (ref 6.5–8.1)

## 2019-08-25 LAB — CBC WITH DIFFERENTIAL/PLATELET
Abs Immature Granulocytes: 0.01 10*3/uL (ref 0.00–0.07)
Basophils Absolute: 0 10*3/uL (ref 0.0–0.1)
Basophils Relative: 1 %
Eosinophils Absolute: 0.2 10*3/uL (ref 0.0–0.5)
Eosinophils Relative: 3 %
HCT: 40.8 % (ref 36.0–46.0)
Hemoglobin: 13.3 g/dL (ref 12.0–15.0)
Immature Granulocytes: 0 %
Lymphocytes Relative: 30 %
Lymphs Abs: 2 10*3/uL (ref 0.7–4.0)
MCH: 28.5 pg (ref 26.0–34.0)
MCHC: 32.6 g/dL (ref 30.0–36.0)
MCV: 87.6 fL (ref 80.0–100.0)
Monocytes Absolute: 0.7 10*3/uL (ref 0.1–1.0)
Monocytes Relative: 10 %
Neutro Abs: 3.8 10*3/uL (ref 1.7–7.7)
Neutrophils Relative %: 56 %
Platelets: 270 10*3/uL (ref 150–400)
RBC: 4.66 MIL/uL (ref 3.87–5.11)
RDW: 13.6 % (ref 11.5–15.5)
WBC: 6.6 10*3/uL (ref 4.0–10.5)
nRBC: 0 % (ref 0.0–0.2)

## 2019-08-25 MED ORDER — ANASTROZOLE 1 MG PO TABS: 1.0000 mg | ORAL_TABLET | Freq: Every day | ORAL | 4 refills | Status: DC

## 2019-08-26 ENCOUNTER — Telehealth: Payer: Self-pay | Admitting: Oncology

## 2019-08-26 NOTE — Telephone Encounter (Signed)
I talk with patient regarding schedule she had and issue regarding her appointment being overbook, waiting and she said she talk with MD but was not satisfied

## 2019-09-19 ENCOUNTER — Other Ambulatory Visit: Payer: Self-pay | Admitting: Allergy and Immunology

## 2019-10-07 ENCOUNTER — Encounter: Payer: Self-pay | Admitting: Orthopaedic Surgery

## 2019-10-07 ENCOUNTER — Ambulatory Visit (INDEPENDENT_AMBULATORY_CARE_PROVIDER_SITE_OTHER): Payer: Medicare Other | Admitting: Orthopaedic Surgery

## 2019-10-07 ENCOUNTER — Other Ambulatory Visit: Payer: Self-pay

## 2019-10-07 VITALS — Ht 60.0 in | Wt 111.0 lb

## 2019-10-07 DIAGNOSIS — G8929 Other chronic pain: Secondary | ICD-10-CM | POA: Diagnosis not present

## 2019-10-07 DIAGNOSIS — M25561 Pain in right knee: Secondary | ICD-10-CM

## 2019-10-07 DIAGNOSIS — M170 Bilateral primary osteoarthritis of knee: Secondary | ICD-10-CM | POA: Diagnosis not present

## 2019-10-07 MED ORDER — LIDOCAINE HCL 1 % IJ SOLN
2.0000 mL | INTRAMUSCULAR | Status: AC | PRN
  Administered 2019-10-07: 15:00:00 2 mL

## 2019-10-07 MED ORDER — METHYLPREDNISOLONE ACETATE 40 MG/ML IJ SUSP
80.0000 mg | INTRAMUSCULAR | Status: AC | PRN
  Administered 2019-10-07: 15:00:00 80 mg via INTRA_ARTICULAR

## 2019-10-07 MED ORDER — BUPIVACAINE HCL 0.5 % IJ SOLN
2.0000 mL | INTRAMUSCULAR | Status: AC | PRN
  Administered 2019-10-07: 2 mL via INTRA_ARTICULAR

## 2019-10-07 NOTE — Progress Notes (Signed)
Office Visit Note   Patient: Cindy Arias           Date of Birth: [birth year]           MRN: [id number] Visit Date: 10/07/2019              Requested by: Cari Caraway, [place],  [place] [address] PCP: Cari Caraway, MD   Assessment & Plan: Visit Diagnoses:  1. Chronic pain of right knee   2. Bilateral primary osteoarthritis of knee     Plan: Nearly a year status post primary left total knee replacement and doing well.  Does have arthritis of the right knee and will inject it with cortisone.  We will also set up therapy at [facility] for strengthening exercises.  Verbally is aware that she might needed right total knee replacement but wants to wait  Follow-Up Instructions: Return if symptoms worsen or fail to improve.   Orders:  Orders Placed This Encounter  Procedures  . Large Joint Inj: R knee  . Ambulatory referral to Physical Therapy   No orders of the defined types were placed in this encounter.     Procedures: Large Joint Inj: R knee on 10/07/2019 2:55 PM Indications: pain and diagnostic evaluation Details: 25 G 1.5 in needle, anteromedial approach  Arthrogram: No  Medications: 2 mL lidocaine 1 %; 2 mL bupivacaine 0.5 %; 80 mg methylPREDNISolone acetate 40 MG/ML Procedure, treatment alternatives, risks and benefits explained, specific risks discussed. Consent was given by the patient. Immediately prior to procedure a time out was called to verify the correct patient, procedure, equipment, support staff and site/side marked as required. Patient was prepped and draped in the usual sterile fashion.       Clinical Data: No additional findings.   Subjective: Chief Complaint  Patient presents with  . Right Knee - Follow-up  . Left Knee - Follow-up    Left total knee arthroplasty DOS 11/04/2018  Patient presents today for follow up on her left and right knee. She had a left total knee arthroplasty on 11/04/2018.  She has been going to  therapy and states that it feels great. Her right knee was last evaluated and injected with cortisone for osteoarthritis on 12/30/2018.She is wanting to get another injection today. She said that the cold weather makes it worse.   HPI  Review of Systems   Objective: Vital Signs: Ht 5' (1.524 m)   Wt 111 lb (50.3 kg)   LMP 02/13/2012   BMI 21.68 kg/m   Physical Exam Constitutional:      Appearance: She is well-developed.  Eyes:     Pupils: Pupils are equal, round, and reactive to light.  Pulmonary:     Effort: Pulmonary effort is normal.  Skin:    General: Skin is warm and dry.  Neurological:     Mental Status: She is alert and oriented to person, place, and time.  Psychiatric:        Behavior: Behavior normal.     Ortho Exam left total knee replaced with full quick extension well over 105 degrees of flexion without instability no localized areas of tenderness.  No effusion.  Alignment is neutral  Right knee with increased varus.  Lacks a few degrees to full extension.  No effusion.  Flexes about 105.  No popliteal pain.  No calf pain.  Motor and sensory exam intact.  No distal edema  Specialty Comments:  No specialty comments available.  Imaging:  No results found.   PMFS History: Patient Active Problem List   Diagnosis Date Noted  . Primary osteoarthritis of left knee 11/04/2018  . History of left knee replacement 11/04/2018  . Bilateral primary osteoarthritis of knee 02/21/2018  . Carcinoma of central portion of right breast in female, estrogen receptor positive ([place]) 12/21/2016  . Malignant neoplasm of overlapping sites of right breast in female, estrogen receptor positive ([place]) 11/19/2016   Past Medical History:  Diagnosis Date  . Arthritis    "bone on bone'  left knee  . Asthma    LAST FLARE UP 3 YRS AGO  . Breast cancer ([place])    RIGHT BREAST  . Colitis   . Dysphagia   . Dyspnea    NONE IN 3 YRS  . Esophageal stricture   . GERD (gastroesophageal reflux  disease)   . History of infertility   . History of radiation therapy 12/31/16- 01/21/17   Right Breast 42.56 Gy in 16 fractions  . Osteopenia   . Pneumonia   . Rosacea   . Ulcerative colitis ([place])     Family History  Problem Relation Age of Onset  . Cancer Mother        Liver- 2003 Lymphoma  . Rheum arthritis Mother   . Stroke Father   . Diabetes Maternal Grandfather        Type 2   . Heart disease Maternal Grandfather   . Hypertension Maternal Grandmother   . Heart disease Maternal Grandmother   . Osteoporosis Maternal Grandmother   . Anesthesia problems Neg Hx     Past Surgical History:  Procedure Laterality Date  . BREAST LUMPECTOMY WITH RADIOACTIVE SEED AND SENTINEL LYMPH NODE BIOPSY Right 11/21/2016   Procedure: BREAST LUMPECTOMY WITH RADIOACTIVE SEED AND SENTINEL LYMPH NODE BIOPSY;  Surgeon: Excell Seltzer, MD;  Location: [facility];  Service: General;  Laterality: Right;  . BREAST SURGERY     RIGHT BREAST LUMPECTOMY  . BUNIONECTOMY WITH HAMMERTOE RECONSTRUCTION Left 07/2014  . CATARACT EXTRACTION Right 10/2015  . ESOPHAGEAL DILATION    . EYE SURGERY     OD  . JOINT REPLACEMENT    . Repair of Chiari Malformation  02/05/13  . SUBOCCIPITAL CRANIECTOMY CERVICAL LAMINECTOMY  02/13/2012   Procedure: SUBOCCIPITAL CRANIECTOMY CERVICAL LAMINECTOMY/DURAPLASTY;  Surgeon: Ophelia Charter, MD;  Location: [place];  Service: Neurosurgery;  Laterality: N/A;  Suboccipital craniectomy with upper cervical laminectomy and duraplasty  . TOTAL KNEE ARTHROPLASTY Left 11/04/2018  . TOTAL KNEE ARTHROPLASTY Left 11/04/2018   Procedure: LEFT TOTAL KNEE ARTHROPLASTY;  Surgeon: Garald Balding, MD;  Location: [facility];  Service: Orthopedics;  Laterality: Left;  . TUBAL LIGATION  1987   Social History   Occupational History  . Not on file  Tobacco Use  . Smoking status: Never Smoker  . Smokeless tobacco: Never Used  . Tobacco comment: she smoked for a few months in college   Substance and Sexual Activity  . Alcohol use: Yes    Comment: 1 glass of wine monthly  . Drug use: No    Comment: Smoked in college ~ 2 years  . Sexual activity: Not on file

## 2019-10-12 ENCOUNTER — Other Ambulatory Visit: Payer: Self-pay | Admitting: Orthopaedic Surgery

## 2019-10-12 ENCOUNTER — Telehealth: Payer: Self-pay | Admitting: Orthopaedic Surgery

## 2019-10-12 ENCOUNTER — Telehealth: Payer: Self-pay | Admitting: *Deleted

## 2019-10-12 DIAGNOSIS — G8929 Other chronic pain: Secondary | ICD-10-CM

## 2019-10-12 DIAGNOSIS — M25561 Pain in right knee: Secondary | ICD-10-CM

## 2019-10-12 NOTE — Telephone Encounter (Signed)
Spoke with patient. New referral has been ordered. I apologized for entering for the wrong facility, it was not made clear to me on where it needed to go. It has been changed and marked as urgent to hopefully expedite this process.

## 2019-10-12 NOTE — Telephone Encounter (Signed)
Patient called stating that she was referred to the wrong PT facility, she was very upset about this.  She would like to be referred to [facility] on [address].  The number there is [phone].  She also stated that she was going to call Dr. Durward Fortes at home because I could not tell her the exact time/date she would be receiving the new referral.  CB#[phone].  Thank you.

## 2019-10-12 NOTE — Telephone Encounter (Signed)
Returning call to pt, requesting a call back

## 2019-11-19 ENCOUNTER — Ambulatory Visit: Payer: Medicare Other | Attending: Internal Medicine

## 2019-11-19 DIAGNOSIS — Z23 Encounter for immunization: Secondary | ICD-10-CM

## 2019-11-19 NOTE — Progress Notes (Signed)
   Covid-19 Vaccination Clinic  Name:  Cindy Arias    MRN: [id number] DOB: 1946-07-07  11/19/2019  [name] was observed post Covid-19 immunization for 15 minutes without incidence. She was provided with Vaccine Information Sheet and instruction to access the V-Safe system.   [name] was instructed to call 911 with any severe reactions post vaccine: Marland Kitchen Difficulty breathing  . Swelling of your face and throat  . A fast heartbeat  . A bad rash all over your body  . Dizziness and weakness    Immunizations Administered    Name Date Dose VIS Date Route   Pfizer COVID-19 Vaccine 11/19/2019  5:35 PM 0.3 mL 10/09/2019 Intramuscular   Manufacturer: [place]   Lot: [id number]   [place]: [id number]

## 2019-12-09 ENCOUNTER — Other Ambulatory Visit: Payer: Self-pay | Admitting: Orthopaedic Surgery

## 2019-12-09 NOTE — Telephone Encounter (Signed)
Ok to prescribe

## 2019-12-10 ENCOUNTER — Ambulatory Visit: Payer: Medicare Other | Attending: Internal Medicine

## 2019-12-10 DIAGNOSIS — Z23 Encounter for immunization: Secondary | ICD-10-CM | POA: Insufficient documentation

## 2019-12-10 NOTE — Progress Notes (Signed)
   Covid-19 Vaccination Clinic  Name:  Cindy Arias    MRN: [id number] DOB: 1946-01-07  12/10/2019  [name] was observed post Covid-19 immunization for 15 minutes without incidence. She was provided with Vaccine Information Sheet and instruction to access the V-Safe system.   [name] was instructed to call 911 with any severe reactions post vaccine: Marland Kitchen Difficulty breathing  . Swelling of your face and throat  . A fast heartbeat  . A bad rash all over your body  . Dizziness and weakness    Immunizations Administered    Name Date Dose VIS Date Route   Pfizer COVID-19 Vaccine 12/10/2019  8:47 AM 0.3 mL 10/09/2019 Intramuscular   Manufacturer: [place]   Lot: [id number]   [place]: [id number]

## 2019-12-15 ENCOUNTER — Ambulatory Visit: Payer: Medicare Other

## 2020-01-05 ENCOUNTER — Encounter: Payer: Self-pay | Admitting: Orthopaedic Surgery

## 2020-01-05 ENCOUNTER — Ambulatory Visit (INDEPENDENT_AMBULATORY_CARE_PROVIDER_SITE_OTHER): Payer: Medicare Other | Admitting: Orthopaedic Surgery

## 2020-01-05 ENCOUNTER — Other Ambulatory Visit: Payer: Self-pay

## 2020-01-05 ENCOUNTER — Ambulatory Visit: Payer: Self-pay

## 2020-01-05 VITALS — Ht 60.0 in | Wt 111.0 lb

## 2020-01-05 DIAGNOSIS — M79671 Pain in right foot: Secondary | ICD-10-CM | POA: Diagnosis not present

## 2020-01-05 NOTE — Progress Notes (Signed)
Office Visit Note   Patient: Cindy Arias           Date of Birth: [birth year]           MRN: [id number] Visit Date: 01/05/2020              Requested by: Cari Caraway, [place],  [place] [id number] PCP: Cari Caraway, MD   Assessment & Plan: Visit Diagnoses:  1. Pain in right foot     Plan: Right second claw toe with metatarsalgia.  X-rays were negative.  Bunion is not symptomatic.  I have applied one of the claw toe devices and she notes that it made a big difference without any further pain in the plantar aspect of her foot.  We will just plan to see her back as needed.  The claw toe is flexible  Follow-Up Instructions: Return if symptoms worsen or fail to improve.   Orders:  Orders Placed This Encounter  Procedures  . XR Foot 2 Views Right   No orders of the defined types were placed in this encounter.     Procedures: No procedures performed   Clinical Data: No additional findings.   Subjective: Chief Complaint  Patient presents with  . Right Foot - Pain  Patient presents today for right foot pain. She said that it started hurting about 6 weeks ago. No known injury. Her pain is under the pad of her forefoot just proximal to her second toe, along with some swelling. She said that the pain occurs with weightbearing. She is not taking anything for pain.   HPI  Review of Systems   Objective: Vital Signs: Ht 5' (1.524 m)   Wt 111 lb (50.3 kg)   LMP 02/13/2012   BMI 21.68 kg/m   Physical Exam Constitutional:      Appearance: She is well-developed.  Eyes:     Pupils: Pupils are equal, round, and reactive to light.  Pulmonary:     Effort: Pulmonary effort is normal.  Skin:    General: Skin is warm and dry.  Neurological:     Mental Status: She is alert and oriented to person, place, and time.  Psychiatric:        Behavior: Behavior normal.     Ortho Exam examination was limited to the right foot.  There is a flexible claw  toe with very mild redness at the PIP joint and tenderness over the metatarsal head plantarly.  No calluses.  No further deformity of the second toe.  There is a mild bunion but no over underlapping of the second or first toe.  No plantar pain.  No heel pain.  No pain over the Achilles tendon.  Motor and sensory exam intact  Specialty Comments:  No specialty comments available.  Imaging: XR Foot 2 Views Right  Result Date: 01/05/2020 Films of the right foot were obtained in 2 projections.  I did not see any abnormalities about the second toe where the patient is symptomatic.  There is a mild bunion and hallux valgus and mild increased angulation between the first and second metatarsal.  There is a small plantar heel spur.  Mild degenerative changes in the midfoot.  No ectopic calcification or acute changes    PMFS History: Patient Active Problem List   Diagnosis Date Noted  . Pain in right foot 01/05/2020  . Primary osteoarthritis of left knee 11/04/2018  . History of left knee replacement 11/04/2018  . Bilateral primary osteoarthritis  of knee 02/21/2018  . 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 Malignant neoplasm of overlapping sites of right breast in female, estrogen receptor positive ([place]) 11/19/2016   Past Medical History:  Diagnosis Date  . Arthritis    "bone on bone'  left knee  . Asthma    LAST FLARE UP 3 YRS AGO  . Breast cancer ([place])    RIGHT BREAST  . Colitis   . Dysphagia   . Dyspnea    NONE IN 3 YRS  . Esophageal stricture   . GERD (gastroesophageal reflux disease)   . History of infertility   . History of radiation therapy 12/31/16- 01/21/17   Right Breast 42.56 Gy in 16 fractions  . Osteopenia   . Pneumonia   . Rosacea   . Ulcerative colitis ([place])     Family History  Problem Relation Age of Onset  . Cancer Mother        Liver- 2003 Lymphoma  . Rheum arthritis Mother   . Stroke Father   .  Diabetes Maternal Grandfather        Type 2   . Heart disease Maternal Grandfather   . Hypertension Maternal Grandmother   . Heart disease Maternal Grandmother   . Osteoporosis Maternal Grandmother   . Anesthesia problems Neg Hx     Past Surgical History:  Procedure Laterality Date  . BREAST LUMPECTOMY WITH RADIOACTIVE SEED AND SENTINEL LYMPH NODE BIOPSY Right 11/21/2016   Procedure: BREAST LUMPECTOMY WITH RADIOACTIVE SEED AND SENTINEL LYMPH NODE BIOPSY;  Surgeon: Excell Seltzer, MD;  Location: [facility];  Service: General;  Laterality: Right;  . BREAST SURGERY     RIGHT BREAST LUMPECTOMY  . BUNIONECTOMY WITH HAMMERTOE RECONSTRUCTION Left 07/2014  . CATARACT EXTRACTION Right 10/2015  . ESOPHAGEAL DILATION    . EYE SURGERY     OD  . JOINT REPLACEMENT    . Repair of Chiari Malformation  02/05/13  . SUBOCCIPITAL CRANIECTOMY CERVICAL LAMINECTOMY  02/13/2012   Procedure: SUBOCCIPITAL CRANIECTOMY CERVICAL LAMINECTOMY/DURAPLASTY;  Surgeon: Ophelia Charter, MD;  Location: [facility];  Service: Neurosurgery;  Laterality: N/A;  Suboccipital craniectomy with upper cervical laminectomy and duraplasty  . TOTAL KNEE ARTHROPLASTY Left 11/04/2018  . TOTAL KNEE ARTHROPLASTY Left 11/04/2018   Procedure: LEFT TOTAL KNEE ARTHROPLASTY;  Surgeon: Garald Balding, MD;  Location: [facility];  Service: Orthopedics;  Laterality: Left;  . TUBAL LIGATION  1987   Social History   Occupational History  . Not on file  Tobacco Use  . Smoking status: Never Smoker  . Smokeless tobacco: Never Used  . Tobacco comment: she smoked for a few months in college  Substance and Sexual Activity  . Alcohol use: Yes    Comment: 1 glass of wine monthly  . Drug use: No    Comment: Smoked in college ~ 2 years  . Sexual activity: Not on file

## 2020-07-19 ENCOUNTER — Encounter: Payer: Self-pay | Admitting: Allergy and Immunology

## 2020-07-19 ENCOUNTER — Other Ambulatory Visit: Payer: Self-pay

## 2020-07-19 ENCOUNTER — Ambulatory Visit (INDEPENDENT_AMBULATORY_CARE_PROVIDER_SITE_OTHER): Payer: Medicare Other | Admitting: Allergy and Immunology

## 2020-07-19 VITALS — BP 108/62 | HR 60 | Temp 98.1°F | Resp 16 | Ht 60.0 in | Wt 118.2 lb

## 2020-07-19 DIAGNOSIS — J3089 Other allergic rhinitis: Secondary | ICD-10-CM

## 2020-07-19 DIAGNOSIS — K219 Gastro-esophageal reflux disease without esophagitis: Secondary | ICD-10-CM

## 2020-07-19 DIAGNOSIS — J4540 Moderate persistent asthma, uncomplicated: Secondary | ICD-10-CM

## 2020-07-19 NOTE — Patient Instructions (Addendum)
  1. Treat inflammation:   A. Symbicort 160-2 inhalations 1-2 times a day  2. Continue treatment for reflux:   A. Omeprazole 40 mg 1-2 times a day  B. Add famotidine 40 mg in evening while "sick"  3. Continue the following if needed:   A. antihistamine   B. Nasal saline  C. Mucinex DM  D. Xopenx HFA  4. Return to clinic in 1 year or earlier if problem   5.  Obtain fall flu vaccine and Covid vaccine

## 2020-07-19 NOTE — Progress Notes (Signed)
[name] [place]   Follow-up Note  Referring Provider: Cari Caraway, MD Primary Provider: Cari Caraway, MD Date of Office Visit: 07/19/2020  Subjective:   Cindy Arias (DOB: 1946-03-29) is a 74 y.o. female who returns to the [facility] on 07/19/2020 in re-evaluation of the following:  HPI: Cindy Arias returns to this clinic in reevaluation of asthma and allergic rhinitis and LPR.  Her last visit to this clinic was 28 July 2019.  She has done wonderful since her last visit without any significant problem involving her respiratory tract and rare use of a short acting bronchodilator and no limitation on ability to exercise while she utilizes Symbicort 1 time per day.  And she has had very little issue with her upper airway.  She has received 2 Pfizer Covid vaccines.  Allergies as of 07/19/2020      Reactions   Penicillins Hives, Other (See Comments)   UNSPECIFIED CHILDHOOD REACTION  Had reaction as a child and as an adult. Had additional reaction but not clear on the specifics   Sulfa Antibiotics Hives   Albuterol Other (See Comments)   UNSPECIFIED REACTION  States she cannot tolerate albuterol without side effects      Medication List      anastrozole 1 MG tablet Commonly known as: ARIMIDEX Take 1 tablet (1 mg total) by mouth daily.   aspirin 325 MG EC tablet Take 1 tablet (325 mg total) by mouth daily with breakfast.   Boniva 150 MG tablet Generic drug: ibandronate Take 1 tablet (150 mg total) by mouth every 30 (thirty) days. Take in the morning with a full glass of water, on an empty stomach, and do not take anything else by mouth or lie down for the next 30 min.   CALCIUM CITRATE-VITAMIN D PO Take 1 tablet by mouth daily.   clindamycin 300 MG capsule Commonly known as: CLEOCIN TAKE 2 CAPSULES 1 HOUR BEFORE DENTAL APPOINTMENT.   fluticasone 50 MCG/ACT nasal spray Commonly known as: FLONASE Place  2 sprays into both nostrils daily as needed for allergies or rhinitis.   levalbuterol 45 MCG/ACT inhaler Commonly known as: XOPENEX HFA Inhale two puffs every 4-6 hours if needed for cough or wheeze   mesalamine 1.2 g EC tablet Commonly known as: LIALDA Take 1.2 g by mouth daily with breakfast.   multivitamins with iron Tabs tablet Take 1 tablet by mouth daily.   omeprazole 40 MG capsule Commonly known as: PRILOSEC Take 1 capsule (40 mg total) by mouth daily.   sodium chloride 0.65 % nasal spray Commonly known as: OCEAN Place 1 spray into the nose daily as needed for congestion.   Symbicort 160-4.5 MCG/ACT inhaler Generic drug: budesonide-formoterol 2 PUFFS EVERY 12 HOURS TO PREVENT COUGH/WHEEZE. RINSE, GARGLE, AND SPIT AFTER USE.   Vitamin D 50 MCG (2000 UT) Caps Take 2,000 Units by mouth daily.   vitamin E 180 MG (400 UNITS) capsule Take 400 Units by mouth daily.       Past Medical History:  Diagnosis Date  . Arthritis    "bone on bone'  left knee  . Asthma    LAST FLARE UP 3 YRS AGO  . Breast cancer ([place])    RIGHT BREAST  . Colitis   . Dysphagia   . Dyspnea    NONE IN 3 YRS  . Esophageal stricture   . GERD (gastroesophageal reflux disease)   . History of infertility   .  History of radiation therapy 12/31/16- 01/21/17   Right Breast 42.56 Gy in 16 fractions  . Osteopenia   . Pneumonia   . Rosacea   . Ulcerative colitis [facility])     Past Surgical History:  Procedure Laterality Date  . BREAST LUMPECTOMY WITH RADIOACTIVE SEED AND SENTINEL LYMPH NODE BIOPSY Right 11/21/2016   Procedure: BREAST LUMPECTOMY WITH RADIOACTIVE SEED AND SENTINEL LYMPH NODE BIOPSY;  Surgeon: Excell Seltzer, MD;  Location: [facility];  Service: General;  Laterality: Right;  . BREAST SURGERY     RIGHT BREAST LUMPECTOMY  . BUNIONECTOMY WITH HAMMERTOE RECONSTRUCTION Left 07/2014  . CATARACT EXTRACTION Right 10/2015  . ESOPHAGEAL DILATION    . EYE SURGERY     OD  .  JOINT REPLACEMENT    . Repair of Chiari Malformation  02/05/13  . SUBOCCIPITAL CRANIECTOMY CERVICAL LAMINECTOMY  02/13/2012   Procedure: SUBOCCIPITAL CRANIECTOMY CERVICAL LAMINECTOMY/DURAPLASTY;  Surgeon: Ophelia Charter, MD;  Location: [facility];  Service: Neurosurgery;  Laterality: N/A;  Suboccipital craniectomy with upper cervical laminectomy and duraplasty  . TOTAL KNEE ARTHROPLASTY Left 11/04/2018  . TOTAL KNEE ARTHROPLASTY Left 11/04/2018   Procedure: LEFT TOTAL KNEE ARTHROPLASTY;  Surgeon: Garald Balding, MD;  Location: [facility];  Service: Orthopedics;  Laterality: Left;  . TUBAL LIGATION  1987    Review of systems negative except as noted in HPI / PMHx or noted below:  Review of Systems  Constitutional: Negative.   HENT: Negative.   Eyes: Negative.   Respiratory: Negative.   Cardiovascular: Negative.   Gastrointestinal: Negative.   Genitourinary: Negative.   Musculoskeletal: Negative.   Skin: Negative.   Neurological: Negative.   Endo/Heme/Allergies: Negative.   Psychiatric/Behavioral: Negative.      Objective:   Vitals:   07/19/20 1122  BP: 108/62  Pulse: 60  Resp: 16  Temp: 98.1 F (36.7 C)  SpO2: 99%   Height: 5' (152.4 cm)  Weight: 118 lb 3.2 oz (53.6 kg)   Physical Exam Constitutional:      Appearance: She is not diaphoretic.  HENT:     Head: Normocephalic.     Right Ear: Tympanic membrane, ear canal and external ear normal.     Left Ear: Tympanic membrane, ear canal and external ear normal.     Nose: Nose normal. No mucosal edema or rhinorrhea.     Mouth/Throat:     Pharynx: Uvula midline. No oropharyngeal exudate.  Eyes:     Conjunctiva/sclera: Conjunctivae normal.  Neck:     Thyroid: No thyromegaly.     Trachea: Trachea normal. No tracheal tenderness or tracheal deviation.  Cardiovascular:     Rate and Rhythm: Normal rate and regular rhythm.     Heart sounds: Normal heart sounds, S1 normal and S2 normal. No murmur heard.   Pulmonary:      Effort: No respiratory distress.     Breath sounds: Normal breath sounds. No stridor. No wheezing or rales.  Lymphadenopathy:     Head:     Right side of head: No tonsillar adenopathy.     Left side of head: No tonsillar adenopathy.     Cervical: No cervical adenopathy.  Skin:    Findings: No erythema or rash.     Nails: There is no clubbing.  Neurological:     Mental Status: She is alert.     Diagnostics:    Spirometry was performed and demonstrated an FEV1 of 1.93 at 106 % of predicted.   Assessment and Plan:  1. Asthma, moderate persistent, well-controlled   2. Other allergic rhinitis   3. LPRD (laryngopharyngeal reflux disease)     1. Treat inflammation:   A. Symbicort 160-2 inhalations 1-2 times a day  2. Continue treatment for reflux:   A. Omeprazole 40 mg 1-2 times a day  B. Add famotidine 40 mg in evening while "sick"  3. Continue the following if needed:   A. antihistamine   B. Nasal saline  C. Mucinex DM  D. Xopenx HFA  4. Return to clinic in 1 year or earlier if problem   5.  Obtain fall flu vaccine and Covid vaccine  Paitlyn is really doing very well on her current therapy and she will continue to treat and prevent inflammation with Symbicort with a dosage that is dependent on her disease activity and a similar approach with her reflux/LPR using a dose of proton pump inhibitor and H2 receptor blocker that is appropriate for her disease activity.  I will see her back in this clinic in 1 year or earlier if there is a problem.  Allena Katz, MD Allergy / Immunology [place]

## 2020-07-20 ENCOUNTER — Encounter: Payer: Self-pay | Admitting: Allergy and Immunology

## 2020-08-26 NOTE — Progress Notes (Signed)
[facility]  [phone] [phone]     ID: Cindy Arias DOB: 12-11-1945  MR#: [id number]  TTS#:[id number]  Patient Care Team: Cari Caraway, MD as PCP - General (Family Medicine) Rodriques Badie, Virgie Dad, MD as Consulting Physician (Oncology) Excell Seltzer, MD (Inactive) as Consulting Physician (General Surgery) Eppie Gibson, MD as Attending Physician (Radiation Oncology) Luberta Mutter, MD as Consulting Physician (Ophthalmology) Neldon Mc, Donnamarie Poag, MD as Consulting Physician (Allergy and Immunology) Arta Silence, MD as Consulting Physician (Gastroenterology) Delice Bison, Charlestine Massed, NP as Nurse Practitioner (Hematology and Oncology) Garald Balding, MD as Consulting Physician (Orthopedic Surgery) OTHER MD:  CHIEF COMPLAINT: Estrogen receptor positive breast cancer  CURRENT TREATMENT: Anastrozole   INTERVAL HISTORY: Cindy Arias returns today for follow-up of her estrogen receptor positive breast cancer.   She continues on anastrozole.  She tolerates this well, with no unusual side effects.  Specifically hot flashes are rare and mild on vaginal dryness is not an issue.  Since her last visit, she underwent bilateral diagnostic mammography with tomography at [facility] on 01/12/2020 showing: breast density category B; no evidence of malignancy in either breast.  Her most recent bone density scan from 12/24/2017 showed osteopenia. She was started on ibandronate by her PCP. She continues this with good tolerance.  She is due for repeat bone density later this year.   REVIEW OF SYSTEMS: Cindy Arias at her right knee surgery under Joni Fears and did really well.  She thinks she may need to get the left one done but she is doing rehab first.  She prefers her husband not to drive so she is doing all the driving.  She is very active in [employer] professional forearm and many other organizations here in town.  A detailed review of systems today was otherwise  stable.   COVID 19 VACCINATION STATUS: Status post Pfizer x2+ booster October 2021   BREAST CANCER HISTORY: From the original intake note:  Cindy Arias had bilateral screening mammography with tomography at [facility] 10/04/2016 showing a possible new mass in the right breast central to the nipple. The breast density was category B. On 10/11/2016 she underwent ultrasonography of the right breast and axilla. This confirmed a 0.9 cm irregular mass in the central breast correlating with the mammography findings. The right axilla was sonographically benign.   On 10/17/2016 she underwent biopsy of the right breast mass in question, showing ([id number]) invasive ductal carcinoma, grade 2, estrogen receptor 100% positive, progesterone receptor 95% positive, both with strong staining intensity, with an MIB-1 of 10%, and no HER-2 amplification, the signals ratio 1.19 and the number per cell 1.90.  The patient's subsequent history is as detailed below   PAST MEDICAL HISTORY: Past Medical History:  Diagnosis Date  . Arthritis    "bone on bone'  left knee  . Asthma    LAST FLARE UP 3 YRS AGO  . Breast cancer ([place])    RIGHT BREAST  . Colitis   . Dysphagia   . Dyspnea    NONE IN 3 YRS  . Esophageal stricture   . GERD (gastroesophageal reflux disease)   . History of infertility   . History of radiation therapy 12/31/16- 01/21/17   Right Breast 42.56 Gy in 16 fractions  . Osteopenia   . Pneumonia   . Rosacea   . Ulcerative colitis ([place])     PAST SURGICAL HISTORY: Past Surgical History:  Procedure Laterality Date  . BREAST LUMPECTOMY WITH RADIOACTIVE SEED AND SENTINEL LYMPH NODE BIOPSY Right 11/21/2016  Procedure: BREAST LUMPECTOMY WITH RADIOACTIVE SEED AND SENTINEL LYMPH NODE BIOPSY;  Surgeon: Excell Seltzer, MD;  Location: [place];  Service: General;  Laterality: Right;  . BREAST SURGERY     RIGHT BREAST LUMPECTOMY  . BUNIONECTOMY WITH HAMMERTOE RECONSTRUCTION Left 07/2014    . CATARACT EXTRACTION Right 10/2015  . ESOPHAGEAL DILATION    . EYE SURGERY     OD  . JOINT REPLACEMENT    . Repair of Chiari Malformation  02/05/13  . SUBOCCIPITAL CRANIECTOMY CERVICAL LAMINECTOMY  02/13/2012   Procedure: SUBOCCIPITAL CRANIECTOMY CERVICAL LAMINECTOMY/DURAPLASTY;  Surgeon: Ophelia Charter, MD;  Location: [place] [facility];  Service: Neurosurgery;  Laterality: N/A;  Suboccipital craniectomy with upper cervical laminectomy and duraplasty  . TOTAL KNEE ARTHROPLASTY Left 11/04/2018  . TOTAL KNEE ARTHROPLASTY Left 11/04/2018   Procedure: LEFT TOTAL KNEE ARTHROPLASTY;  Surgeon: Garald Balding, MD;  Location: [place];  Service: Orthopedics;  Laterality: Left;  . TUBAL LIGATION  1987    FAMILY HISTORY Family History  Problem Relation Age of Onset  . Cancer Mother        Liver- 2003 Lymphoma  . Rheum arthritis Mother   . Stroke Father   . Diabetes Maternal Grandfather        Type 2   . Heart disease Maternal Grandfather   . Hypertension Maternal Grandmother   . Heart disease Maternal Grandmother   . Osteoporosis Maternal Grandmother   . Anesthesia problems Neg Hx   The patient's father died at the age of 16 FOLLOWING his stroke. The patient's mother died of non-Hodgkin's lymphoma at the age of 64. The patient had no brothers, 1 sister. There is no history of breast or ovarian cancer in the family   GYNECOLOGIC HISTORY:  Patient's last menstrual period was 02/13/2012. Menarche age 66, first live birth age 22. The patient is GX P1. She went through the change of life in her 64s. She did not take hormone replacement.   SOCIAL HISTORY:  Cindy Arias work for [employer], for the [employer] agency in for [employer]. She is now retired and does a lot of volunteering. Her husband Cindy Arias is a retired Optometrist. They have been married 66 years as of January 2018. Their daughter Cindy Arias teaches special education in [place]. The patient has one grandchild.  She attends [employer]: Not in place   HEALTH MAINTENANCE: Social History   Tobacco Use  . Smoking status: Never Smoker  . Smokeless tobacco: Never Used  . Tobacco comment: she smoked for a few months in college  Vaping Use  . Vaping Use: Never used  Substance Use Topics  . Alcohol use: Yes    Comment: 1 glass of wine monthly  . Drug use: No    Comment: Smoked in college ~ 2 years     Colonoscopy: Due 2019  PAP: Up-to-date  Bone density: mild osteopenia   Allergies  Allergen Reactions  . Penicillins Hives and Other (See Comments)    UNSPECIFIED CHILDHOOD REACTION  Had reaction as a child and as an adult. Had additional reaction but not clear on the specifics  . Sulfa Antibiotics Hives  . Albuterol Other (See Comments)    UNSPECIFIED REACTION  States she cannot tolerate albuterol without side effects    Current Outpatient Medications  Medication Sig Dispense Refill  . anastrozole (ARIMIDEX) 1 MG tablet Take 1 tablet (1 mg total) by mouth daily. 90 tablet 4  .  aspirin EC 325 MG EC tablet Take 1 tablet (325 mg total) by mouth daily with breakfast. 30 tablet 0  . CALCIUM CITRATE-VITAMIN D PO Take 1 tablet by mouth daily.     . Cholecalciferol (VITAMIN D) 50 MCG (2000 UT) CAPS Take 2,000 Units by mouth daily.     . clindamycin (CLEOCIN) 300 MG capsule TAKE 2 CAPSULES 1 HOUR BEFORE DENTAL APPOINTMENT. 2 capsule 0  . fluticasone (FLONASE) 50 MCG/ACT nasal spray Place 2 sprays into both nostrils daily as needed for allergies or rhinitis.    Marland Kitchen ibandronate (BONIVA) 150 MG tablet Take 1 tablet (150 mg total) by mouth every 30 (thirty) days. Take in the morning with a full glass of water, on an empty stomach, and do not take anything else by mouth or lie down for the next 30 min.    . levalbuterol (XOPENEX HFA) 45 MCG/ACT inhaler Inhale two puffs every 4-6 hours if needed for cough or wheeze (Patient taking differently: Inhale 1 puff into the lungs  every 6 (six) hours as needed for wheezing or shortness of breath. ) 1 Inhaler 3  . mesalamine (LIALDA) 1.2 G EC tablet Take 1.2 g by mouth daily with breakfast.     . Multiple Vitamins-Iron (MULTIVITAMINS WITH IRON) TABS Take 1 tablet by mouth daily.    Marland Kitchen omeprazole (PRILOSEC) 40 MG capsule Take 1 capsule (40 mg total) by mouth daily. 60 capsule 5  . sodium chloride (OCEAN) 0.65 % nasal spray Place 1 spray into the nose daily as needed for congestion.     . SYMBICORT 160-4.5 MCG/ACT inhaler 2 PUFFS EVERY 12 HOURS TO PREVENT COUGH/WHEEZE. RINSE, GARGLE, AND SPIT AFTER USE. 10.2 g 8  . vitamin E 400 UNIT capsule Take 400 Units by mouth daily.     Current Facility-Administered Medications  Medication Dose Route Frequency Provider Last Rate Last Admin  . lidocaine (XYLOCAINE) 1 % (with pres) injection 3 mL  3 mL Infiltration Once Hilts, Michael, MD        OBJECTIVE: White woman who appears stated age  Vitals:   08/29/20 0920  BP: (!) 114/44  Pulse: 69  Resp: 18  Temp: 97.6 F (36.4 C)  SpO2: 98%     Body mass index is 22.85 kg/m.    ECOG FS:1 - Symptomatic but completely ambulatory  Sclerae unicteric, EOMs intact Wearing a mask No cervical or supraclavicular adenopathy Lungs no rales or rhonchi Heart regular rate and rhythm Abd soft, nontender, positive bowel sounds MSK no focal spinal tenderness, no upper extremity lymphedema Neuro: nonfocal, well oriented, appropriate affect Breasts: The right breast has undergone lumpectomy and radiation.  The cosmetic result is excellent.  There is no evidence of local recurrence.  Left breast and both axillae are benign.   LAB RESULTS:  CMP     Component Value Date/Time   NA 138 08/25/2019 1004   NA 140 04/29/2017 1202   K 4.4 08/25/2019 1004   K 4.2 04/29/2017 1202   CL 104 08/25/2019 1004   CO2 24 08/25/2019 1004   CO2 25 04/29/2017 1202   GLUCOSE 94 08/25/2019 1004   GLUCOSE 97 04/29/2017 1202   BUN 17 08/25/2019 1004   BUN  19.2 04/29/2017 1202   CREATININE 0.78 08/25/2019 1004   CREATININE 0.8 04/29/2017 1202   CALCIUM 9.3 08/25/2019 1004   CALCIUM 9.8 04/29/2017 1202   PROT 7.1 08/25/2019 1004   PROT 7.3 04/29/2017 1202   ALBUMIN 4.2 08/25/2019 1004   ALBUMIN  4.1 04/29/2017 1202   AST 25 08/25/2019 1004   AST 21 04/29/2017 1202   ALT 20 08/25/2019 1004   ALT 20 04/29/2017 1202   ALKPHOS 68 08/25/2019 1004   ALKPHOS 80 04/29/2017 1202   BILITOT 0.6 08/25/2019 1004   BILITOT 0.56 04/29/2017 1202   GFRNONAA >60 08/25/2019 1004   GFRAA >60 08/25/2019 1004    INo results found for: SPEP, UPEP  Lab Results  Component Value Date   WBC 5.5 08/29/2020   NEUTROABS 3.0 08/29/2020   HGB 12.6 08/29/2020   HCT 39.2 08/29/2020   MCV 88.5 08/29/2020   PLT 256 08/29/2020      Chemistry      Component Value Date/Time   NA 138 08/25/2019 1004   NA 140 04/29/2017 1202   K 4.4 08/25/2019 1004   K 4.2 04/29/2017 1202   CL 104 08/25/2019 1004   CO2 24 08/25/2019 1004   CO2 25 04/29/2017 1202   BUN 17 08/25/2019 1004   BUN 19.2 04/29/2017 1202   CREATININE 0.78 08/25/2019 1004   CREATININE 0.8 04/29/2017 1202      Component Value Date/Time   CALCIUM 9.3 08/25/2019 1004   CALCIUM 9.8 04/29/2017 1202   ALKPHOS 68 08/25/2019 1004   ALKPHOS 80 04/29/2017 1202   AST 25 08/25/2019 1004   AST 21 04/29/2017 1202   ALT 20 08/25/2019 1004   ALT 20 04/29/2017 1202   BILITOT 0.6 08/25/2019 1004   BILITOT 0.56 04/29/2017 1202       No results found for: LABCA2  No components found for: LABCA125  No results for input(s): INR in the last 168 hours.  Urinalysis    Component Value Date/Time   COLORURINE AMBER (A) 10/27/2018 0945   APPEARANCEUR HAZY (A) 10/27/2018 0945   LABSPEC 1.026 10/27/2018 0945   PHURINE 5.0 10/27/2018 0945   GLUCOSEU NEGATIVE 10/27/2018 0945   HGBUR NEGATIVE 10/27/2018 0945   BILIRUBINUR NEGATIVE 10/27/2018 0945   KETONESUR NEGATIVE 10/27/2018 0945   PROTEINUR NEGATIVE  10/27/2018 0945   NITRITE NEGATIVE 10/27/2018 0945   LEUKOCYTESUR NEGATIVE 10/27/2018 0945    STUDIES: No results found.   ELIGIBLE FOR AVAILABLE RESEARCH PROTOCOL: no  ASSESSMENT: 74 y.o. [place] woman status post right breast overlapping sites biopsy 10/17/2016 for a clinical T1b N0, stage IA invasive ductal carcinoma, grade 2, estrogen and progesterone receptor positive, HER-2 nonamplified, with an MIB-1 of 10%  (1)  s/p right lumpectomy and sentinel lymph node sampling 11/21/2016 for aT1c pN0, stage IA invasive ductal carcinoma, grade 2, with close but negative margins  (2) Oncotype DX score of 16 predict a 10 year risk of recurrence outside the breast of 10% if the patient's only systemic therapy is tamoxifen for 5 years. It also predicts no benefit from adjuvant chemotherapy.with will be  (3) adjuvant radiation 12/31/16 - 01/21/17 Site/dose:Right breast treated to 42.56 Gy in 16 fractions  (4) anastrozole started 02/15/2017  (a) bone density at [facility] 12/24/2017 shows a T score of -2.0.  (This is unchanged from baseline).   PLAN: Cindy Arias is now 3-1/2 years out from definitive surgery for her breast cancer with no evidence of disease recurrence.  This is very febrile.  She is tolerating anastrozole well and the plan will be to continue that a total of 5 years.  She has osteopenia.  This is followed through her primary care physician Dr. Leonides Schanz.  The patient is on ibandronate with good tolerance.  There is a bone density repeat pending  Cindy Arias will see me again a year from now.  At that point she will likely "graduate" from visits here.  Total encounter time 20 minutes.*   Jaxan Michel, Virgie Dad, MD  08/29/20 9:41 AM Medical Oncology and Hematology [facility] [place], [place] [address] Tel. [phone]    Fax. [phone]    I, Wilburn Mylar, am acting as scribe for Dr. Virgie Dad. Shermon Bozzi.  I, Lurline Del MD, have reviewed the above  documentation for accuracy and completeness, and I agree with the above.   *Total Encounter Time as defined by the Centers for Medicare and Medicaid Services includes, in addition to the face-to-face time of a patient visit (documented in the note above) non-face-to-face time: obtaining and reviewing outside history, ordering and reviewing medications, tests or procedures, care coordination (communications with other health care professionals or caregivers) and documentation in the medical record.

## 2020-08-29 ENCOUNTER — Inpatient Hospital Stay: Payer: Medicare Other | Attending: Oncology | Admitting: Oncology

## 2020-08-29 ENCOUNTER — Inpatient Hospital Stay: Payer: Medicare Other

## 2020-08-29 ENCOUNTER — Other Ambulatory Visit: Payer: Self-pay

## 2020-08-29 VITALS — BP 114/44 | HR 69 | Temp 97.6°F | Resp 18 | Ht 60.0 in | Wt 117.0 lb

## 2020-08-29 DIAGNOSIS — Z823 Family history of stroke: Secondary | ICD-10-CM | POA: Insufficient documentation

## 2020-08-29 DIAGNOSIS — Z800 Family history of malignant neoplasm of digestive organs: Secondary | ICD-10-CM | POA: Diagnosis not present

## 2020-08-29 DIAGNOSIS — Z79811 Long term (current) use of aromatase inhibitors: Secondary | ICD-10-CM | POA: Insufficient documentation

## 2020-08-29 DIAGNOSIS — Z8262 Family history of osteoporosis: Secondary | ICD-10-CM | POA: Insufficient documentation

## 2020-08-29 DIAGNOSIS — Z8249 Family history of ischemic heart disease and other diseases of the circulatory system: Secondary | ICD-10-CM | POA: Diagnosis not present

## 2020-08-29 DIAGNOSIS — Z8261 Family history of arthritis: Secondary | ICD-10-CM | POA: Insufficient documentation

## 2020-08-29 DIAGNOSIS — Z882 Allergy status to sulfonamides status: Secondary | ICD-10-CM | POA: Insufficient documentation

## 2020-08-29 DIAGNOSIS — Z923 Personal history of irradiation: Secondary | ICD-10-CM | POA: Diagnosis not present

## 2020-08-29 DIAGNOSIS — Z79899 Other long term (current) drug therapy: Secondary | ICD-10-CM | POA: Insufficient documentation

## 2020-08-29 DIAGNOSIS — Z833 Family history of diabetes mellitus: Secondary | ICD-10-CM | POA: Insufficient documentation

## 2020-08-29 DIAGNOSIS — C50111 Malignant neoplasm of central portion of right female breast: Secondary | ICD-10-CM | POA: Diagnosis not present

## 2020-08-29 DIAGNOSIS — Z880 Allergy status to penicillin: Secondary | ICD-10-CM | POA: Diagnosis not present

## 2020-08-29 DIAGNOSIS — C50811 Malignant neoplasm of overlapping sites of right female breast: Secondary | ICD-10-CM

## 2020-08-29 DIAGNOSIS — R509 Fever, unspecified: Secondary | ICD-10-CM | POA: Insufficient documentation

## 2020-08-29 DIAGNOSIS — Z170 Estrogen receptor positive status [ER+]: Secondary | ICD-10-CM | POA: Insufficient documentation

## 2020-08-29 DIAGNOSIS — M8580 Other specified disorders of bone density and structure, unspecified site: Secondary | ICD-10-CM | POA: Insufficient documentation

## 2020-08-29 LAB — CBC WITH DIFFERENTIAL/PLATELET
Abs Immature Granulocytes: 0.01 10*3/uL (ref 0.00–0.07)
Basophils Absolute: 0 10*3/uL (ref 0.0–0.1)
Basophils Relative: 1 %
Eosinophils Absolute: 0.1 10*3/uL (ref 0.0–0.5)
Eosinophils Relative: 2 %
HCT: 39.2 % (ref 36.0–46.0)
Hemoglobin: 12.6 g/dL (ref 12.0–15.0)
Immature Granulocytes: 0 %
Lymphocytes Relative: 31 %
Lymphs Abs: 1.7 10*3/uL (ref 0.7–4.0)
MCH: 28.4 pg (ref 26.0–34.0)
MCHC: 32.1 g/dL (ref 30.0–36.0)
MCV: 88.5 fL (ref 80.0–100.0)
Monocytes Absolute: 0.6 10*3/uL (ref 0.1–1.0)
Monocytes Relative: 11 %
Neutro Abs: 3 10*3/uL (ref 1.7–7.7)
Neutrophils Relative %: 55 %
Platelets: 256 10*3/uL (ref 150–400)
RBC: 4.43 MIL/uL (ref 3.87–5.11)
RDW: 13.5 % (ref 11.5–15.5)
WBC: 5.5 10*3/uL (ref 4.0–10.5)
nRBC: 0 % (ref 0.0–0.2)

## 2020-08-29 LAB — COMPREHENSIVE METABOLIC PANEL
ALT: 18 U/L (ref 0–44)
AST: 22 U/L (ref 15–41)
Albumin: 3.9 g/dL (ref 3.5–5.0)
Alkaline Phosphatase: 84 U/L (ref 38–126)
Anion gap: 7 (ref 5–15)
BUN: 22 mg/dL (ref 8–23)
CO2: 24 mmol/L (ref 22–32)
Calcium: 9 mg/dL (ref 8.9–10.3)
Chloride: 108 mmol/L (ref 98–111)
Creatinine, Ser: 0.76 mg/dL (ref 0.44–1.00)
GFR, Estimated: >60 mL/min (ref >60)
Glucose, Bld: 107 mg/dL — ABNORMAL HIGH (ref 70–99)
Potassium: 4.2 mmol/L (ref 3.5–5.1)
Sodium: 139 mmol/L (ref 135–145)
Total Bilirubin: 0.3 mg/dL (ref 0.3–1.2)
Total Protein: 6.7 g/dL (ref 6.5–8.1)

## 2020-09-13 ENCOUNTER — Other Ambulatory Visit: Payer: Self-pay | Admitting: Oncology

## 2020-09-13 ENCOUNTER — Other Ambulatory Visit: Payer: Self-pay | Admitting: Allergy and Immunology

## 2020-10-15 ENCOUNTER — Other Ambulatory Visit: Payer: Self-pay | Admitting: Allergy and Immunology

## 2021-01-02 ENCOUNTER — Other Ambulatory Visit: Payer: Self-pay | Admitting: Oncology

## 2021-01-11 ENCOUNTER — Other Ambulatory Visit: Payer: Self-pay

## 2021-01-11 ENCOUNTER — Ambulatory Visit (INDEPENDENT_AMBULATORY_CARE_PROVIDER_SITE_OTHER): Payer: Medicare Other | Admitting: Orthopaedic Surgery

## 2021-01-11 ENCOUNTER — Encounter: Payer: Self-pay | Admitting: Orthopaedic Surgery

## 2021-01-11 DIAGNOSIS — M2041 Other hammer toe(s) (acquired), right foot: Secondary | ICD-10-CM | POA: Diagnosis not present

## 2021-01-11 NOTE — Progress Notes (Signed)
Office Visit Note   Patient: Cindy Arias           Date of Birth: [birth year]           MRN: [id number] Visit Date: 01/11/2021              Requested by: Cari Caraway, [place],  [place] [address] PCP: Cari Caraway, MD   Assessment & Plan: Visit Diagnoses:  1. Hammertoe of second toe of right foot     Plan: Right second hammertoe has become a real nuisance and verbally would like something definitive so that she can walk comfortably.  This would include a Z-lengthening of the extensor tendon at the level of the metacarpal phalangeal joint and fusion of the PIP joint.  There is a fixed flexion contracture.  Then K wire fixation.  I discussed the ankle block in the outpatient nature of the need of a pin for probably 4weeks she did have some limitation of her activities but she would like to proceed.  In addition she has developed a large callus on the medial aspect of her third left toe and I shaved this down so that she was more comfortable she will wear a toe sleeve to prevent juxtaposition of the second and third toe  Follow-Up Instructions: Return We will schedule surgery for right second hammertoe.   Orders:  No orders of the defined types were placed in this encounter.  No orders of the defined types were placed in this encounter.     Procedures: No procedures performed   Clinical Data: No additional findings.   Subjective: Chief Complaint  Patient presents with  . Right Foot - Pain  Patient presents today for her right foot. She was seen one year ago for her right second claw toe. She said it has continued to worsen and cause her pain. It has curled more and gets caught. She also has an issue with her left foot. She said that her left third toe gets blisters on the side of that toe. She said that it has been happening for about 6 months. She wears a silicone sleeve on that toe and states that it helps some.   HPI  Review of  Systems   Objective: Vital Signs: Ht 5' (1.524 m)   Wt 117 lb (53.1 kg)   LMP 02/13/2012   BMI 22.85 kg/m   Physical Exam Constitutional:      Appearance: She is well-developed.  Eyes:     Pupils: Pupils are equal, round, and reactive to light.  Pulmonary:     Effort: Pulmonary effort is normal.  Skin:    General: Skin is warm and dry.  Neurological:     Mental Status: She is alert and oriented to person, place, and time.  Psychiatric:        Behavior: Behavior normal.     Ortho Exam right second toe with hammering with extension across the metacarpal phalangeal joint and a fixed flexion contracture across the PIP joint.  There is a large callus on the dorsum of the PIP joint.  Good capillary refill.  There is no under overlapping of the second or great toe.  I can easily place the metacarpal phalangeal joint in neutral but cannot correct the fixed flexion contracture across the PIP joint.  In the left third toe there is a large callus between the second and third toe.  I shaved this down so that it was much more comfortable.  There is a deformity with arthritis at the DIP joint its causing a bony protuberance and secondarily the callus  Specialty Comments:  No specialty comments available.  Imaging: No results found.   PMFS History: Patient Active Problem List   Diagnosis Date Noted  . Hammertoe of second toe of right foot 01/11/2021  . Pain in right foot 01/05/2020  . Primary osteoarthritis of left knee 11/04/2018  . History of left knee replacement 11/04/2018  . Bilateral primary osteoarthritis of knee 02/21/2018  . Carcinoma of central portion of right breast in female, estrogen receptor positive ([place]) 12/21/2016  . Malignant neoplasm of overlapping sites of right breast in female, estrogen receptor positive ([place]) 11/19/2016   Past Medical History:  Diagnosis Date  . Arthritis    "bone on bone'  left knee  . Asthma    LAST FLARE UP 3 YRS AGO  . Breast cancer  ([place])    RIGHT BREAST  . Colitis   . Dysphagia   . Dyspnea    NONE IN 3 YRS  . Esophageal stricture   . GERD (gastroesophageal reflux disease)   . History of infertility   . History of radiation therapy 12/31/16- 01/21/17   Right Breast 42.56 Gy in 16 fractions  . Osteopenia   . Pneumonia   . Rosacea   . Ulcerative colitis ([place])     Family History  Problem Relation Age of Onset  . Cancer Mother        Liver- 2003 Lymphoma  . Rheum arthritis Mother   . Stroke Father   . Diabetes Maternal Grandfather        Type 2   . Heart disease Maternal Grandfather   . Hypertension Maternal Grandmother   . Heart disease Maternal Grandmother   . Osteoporosis Maternal Grandmother   . Anesthesia problems Neg Hx     Past Surgical History:  Procedure Laterality Date  . BREAST LUMPECTOMY WITH RADIOACTIVE SEED AND SENTINEL LYMPH NODE BIOPSY Right 11/21/2016   Procedure: BREAST LUMPECTOMY WITH RADIOACTIVE SEED AND SENTINEL LYMPH NODE BIOPSY;  Surgeon: Excell Seltzer, MD;  Location: [place];  Service: General;  Laterality: Right;  . BREAST SURGERY     RIGHT BREAST LUMPECTOMY  . BUNIONECTOMY WITH HAMMERTOE RECONSTRUCTION Left 07/2014  . CATARACT EXTRACTION Right 10/2015  . ESOPHAGEAL DILATION    . EYE SURGERY     OD  . JOINT REPLACEMENT    . Repair of Chiari Malformation  02/05/13  . SUBOCCIPITAL CRANIECTOMY CERVICAL LAMINECTOMY  02/13/2012   Procedure: SUBOCCIPITAL CRANIECTOMY CERVICAL LAMINECTOMY/DURAPLASTY;  Surgeon: Ophelia Charter, MD;  Location: [facility];  Service: Neurosurgery;  Laterality: N/A;  Suboccipital craniectomy with upper cervical laminectomy and duraplasty  . TOTAL KNEE ARTHROPLASTY Left 11/04/2018  . TOTAL KNEE ARTHROPLASTY Left 11/04/2018   Procedure: LEFT TOTAL KNEE ARTHROPLASTY;  Surgeon: Garald Balding, MD;  Location: [place];  Service: Orthopedics;  Laterality: Left;  . TUBAL LIGATION  1987   Social History   Occupational History  . Not on file   Tobacco Use  . Smoking status: Never Smoker  . Smokeless tobacco: Never Used  . Tobacco comment: she smoked for a few months in college  Vaping Use  . Vaping Use: Never used  Substance and Sexual Activity  . Alcohol use: Yes    Comment: 1 glass of wine monthly  . Drug use: No    Comment: Smoked in college ~ 2 years  . Sexual activity: Not on file

## 2021-01-16 ENCOUNTER — Other Ambulatory Visit: Payer: Self-pay

## 2021-01-16 ENCOUNTER — Telehealth: Payer: Self-pay | Admitting: Orthopaedic Surgery

## 2021-01-16 NOTE — Telephone Encounter (Signed)
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

## 2021-01-16 NOTE — Telephone Encounter (Signed)
Patient scheduled for hammer toe surgery on 03-09-21 at [facility] with Dr. Durward Fortes.  She would for you to describe the type of boot/shoe she will be wearing post operatively.    cb  [phone]

## 2021-01-16 NOTE — Telephone Encounter (Signed)
Please advise of the shoe and I can call her back and let her know

## 2021-01-23 ENCOUNTER — Encounter: Payer: Self-pay | Admitting: Oncology

## 2021-02-15 ENCOUNTER — Ambulatory Visit: Payer: Medicare Other | Attending: Internal Medicine

## 2021-02-15 DIAGNOSIS — Z23 Encounter for immunization: Secondary | ICD-10-CM

## 2021-02-15 NOTE — Progress Notes (Signed)
   Covid-19 Vaccination Clinic  Name:  Cindy Arias    MRN: [id number] DOB: [birth year]  02/15/2021  Cindy Arias was observed post Covid-19 immunization for 15 minutes without incident. She was provided with Vaccine Information Sheet and instruction to access the V-Safe system.   Cindy Arias was instructed to call 911 with any severe reactions post vaccine: Marland Kitchen Difficulty breathing  . Swelling of face and throat  . A fast heartbeat  . A bad rash all over body  . Dizziness and weakness   Immunizations Administered    Name Date Dose VIS Date Route   PFIZER Comrnaty(Gray TOP) Covid-19 Vaccine 02/15/2021  9:23 AM 0.3 mL 10/06/2020 Intramuscular   Manufacturer: [employer], [employer]   Lot: [id number]   [place]: [id number]

## 2021-02-20 ENCOUNTER — Other Ambulatory Visit (HOSPITAL_COMMUNITY): Payer: Self-pay

## 2021-02-20 MED ORDER — COVID-19 MRNA VAC-TRIS(PFIZER) 30 MCG/0.3ML IM SUSP
INTRAMUSCULAR | 0 refills | Status: DC
  Filled 2021-02-20: qty 0.3, 1d supply, fill #0

## 2021-02-21 ENCOUNTER — Other Ambulatory Visit (HOSPITAL_COMMUNITY): Payer: Self-pay

## 2021-02-27 ENCOUNTER — Telehealth: Payer: Self-pay | Admitting: Orthopaedic Surgery

## 2021-02-27 NOTE — Telephone Encounter (Signed)
called

## 2021-02-27 NOTE — Telephone Encounter (Signed)
Patient is scheduled at [facility] with Dr. Durward Fortes on 03-09-21 for the following procedure:   z-lengthening extension tendon right 2nd toe with fusion [id number]-wire fixation  Patient is asking if she is to bring a boot to surgery- and if so, what type and size.    Please call patient to advise [phone]

## 2021-02-27 NOTE — Telephone Encounter (Signed)
Pls advise.  

## 2021-03-01 ENCOUNTER — Other Ambulatory Visit (HOSPITAL_COMMUNITY): Payer: Self-pay

## 2021-03-02 ENCOUNTER — Other Ambulatory Visit: Payer: Self-pay

## 2021-03-02 ENCOUNTER — Ambulatory Visit (HOSPITAL_COMMUNITY)
Admit: 2021-03-02 | Discharge: 2021-03-02 | Disposition: A | Discharge status: Home / Self Care | Payer: Medicare Other | Source: Ambulatory Visit | Attending: Family Medicine | Admitting: Family Medicine

## 2021-03-02 DIAGNOSIS — M8580 Other specified disorders of bone density and structure, unspecified site: Secondary | ICD-10-CM | POA: Diagnosis not present

## 2021-03-02 DIAGNOSIS — Z79899 Other long term (current) drug therapy: Secondary | ICD-10-CM | POA: Insufficient documentation

## 2021-03-02 MED ORDER — ZOLEDRONIC ACID 5 MG/100ML IV SOLN: 5.0000 mg | Freq: Once | INTRAVENOUS | Status: AC

## 2021-03-02 MED ORDER — ZOLEDRONIC ACID 5 MG/100ML IV SOLN
INTRAVENOUS | Status: AC
  Administered 2021-03-02: 5 mg via INTRAVENOUS
  Filled 2021-03-02: qty 100

## 2021-03-02 NOTE — Discharge Instructions (Signed)
Zoledronic Acid Injection (Paget's Disease, Osteoporosis) What is this medicine? ZOLEDRONIC ACID (ZOE le dron ik AS id) slows calcium loss from bones. It treats Paget's disease and osteoporosis. It may be used in other people at risk for bone loss. This medicine may be used for other purposes; ask your health care provider or pharmacist if you have questions. COMMON BRAND NAME(S): Reclast, Zometa What should I tell my health care provider before I take this medicine? They need to know if you have any of these conditions:  bleeding disorder  cancer  dental disease  kidney disease  low levels of calcium in the blood  low red blood cell counts  lung or breathing disease (asthma)  receiving steroids like dexamethasone or prednisone  an unusual or allergic reaction to zoledronic acid, other medicines, foods, dyes, or preservatives  pregnant or trying to get pregnant  breast-feeding How should I use this medicine? This drug is injected into a vein. It is given by a health care provider in a hospital or clinic setting. A special MedGuide will be given to you before each treatment. Be sure to read this information carefully each time. Talk to your health care provider about the use of this drug in children. Special care may be needed. Overdosage: If you think you have taken too much of this medicine contact a poison control center or emergency room at once. NOTE: This medicine is only for you. Do not share this medicine with others. What if I miss a dose? Keep appointments for follow-up doses. It is important not to miss your dose. Call your health care provider if you are unable to keep an appointment. What may interact with this medicine?  certain antibiotics given by injection  NSAIDs, medicines for pain and inflammation, like ibuprofen or naproxen  some diuretics like bumetanide, furosemide  teriparatide This list may not describe all possible interactions. Give your health  care provider a list of all the medicines, herbs, non-prescription drugs, or dietary supplements you use. Also tell them if you smoke, drink alcohol, or use illegal drugs. Some items may interact with your medicine. What should I watch for while using this medicine? Visit your health care provider for regular checks on your progress. It may be some time before you see the benefit from this drug. Some people who take this drug have severe bone, joint, or muscle pain. This drug may also increase your risk for jaw problems or a broken thigh bone. Tell your health care provider right away if you have severe pain in your jaw, bones, joints, or muscles. Tell you health care provider if you have any pain that does not go away or that gets worse. You should make sure you get enough calcium and vitamin D while you are taking this drug. Discuss the foods you eat and the vitamins you take with your health care provider. You may need blood work done while you are taking this drug. Tell your dentist and dental surgeon that you are taking this drug. You should not have major dental surgery while on this drug. See your dentist to have a dental exam and fix any dental problems before starting this drug. Take good care of your teeth while on this drug. Make sure you see your dentist for regular follow-up appointments. What side effects may I notice from receiving this medicine? Side effects that you should report to your doctor or health care provider as soon as possible:  allergic reactions (skin rash, itching or  hives; swelling of the face, lips, or tongue)  bone pain  infection (fever, chills, cough, sore throat, pain or trouble passing urine)  jaw pain, especially after dental work  joint pain  kidney injury (trouble passing urine or change in the amount of urine)  low calcium levels (fast heartbeat; muscle cramps or pain; pain, tingling, or numbness in the hands or feet; seizures)  low red blood cell  counts (trouble breathing; feeling faint; lightheaded, falls; unusually weak or tired)  muscle pain  palpitations  redness, blistering, peeling, or loosening of the skin, including inside the mouth Side effects that usually do not require medical attention (report to your doctor or health care provider if they continue or are bothersome):  diarrhea  eye irritation, itching, or pain  fever  general ill feeling or flu-like symptoms  headache  increase in blood pressure  nausea  pain, redness, or irritation at site where injected  stomach pain  upset stomach This list may not describe all possible side effects. Call your doctor for medical advice about side effects. You may report side effects to FDA at 1-800-FDA-1088. Where should I keep my medicine? This drug is given in a hospital or clinic. It will not be stored at home. NOTE: This sheet is a summary. It may not cover all possible information. If you have questions about this medicine, talk to your doctor, pharmacist, or health care provider.  2021 Elsevier/Gold Standard (2019-08-03 11:46:18)

## 2021-03-09 ENCOUNTER — Other Ambulatory Visit: Payer: Self-pay | Admitting: Orthopedic Surgery

## 2021-03-09 DIAGNOSIS — M2041 Other hammer toe(s) (acquired), right foot: Secondary | ICD-10-CM | POA: Diagnosis not present

## 2021-03-09 MED ORDER — HYDROCODONE-ACETAMINOPHEN 5-325 MG PO TABS: 1.0000 | ORAL_TABLET | Freq: Four times a day (QID) | ORAL | 0 refills | Status: AC | PRN

## 2021-03-16 ENCOUNTER — Other Ambulatory Visit: Payer: Self-pay

## 2021-03-16 ENCOUNTER — Telehealth: Payer: Self-pay

## 2021-03-16 ENCOUNTER — Encounter: Payer: Self-pay | Admitting: Orthopaedic Surgery

## 2021-03-16 ENCOUNTER — Ambulatory Visit (INDEPENDENT_AMBULATORY_CARE_PROVIDER_SITE_OTHER): Payer: Medicare Other | Admitting: Orthopaedic Surgery

## 2021-03-16 VITALS — Ht 60.0 in | Wt 116.0 lb

## 2021-03-16 DIAGNOSIS — M2041 Other hammer toe(s) (acquired), right foot: Secondary | ICD-10-CM

## 2021-03-16 NOTE — Telephone Encounter (Signed)
Patient states she has had an dry cough for about a week. When she does cough something up, it is clear. She is having a hard time sleeping as when she lays down she coughs even more.   [name]

## 2021-03-16 NOTE — Telephone Encounter (Signed)
Please have her perform the following underlined medication changes:  1. Treat inflammation:               A. Symbicort 160-2 inhalations 2 times a day   2. Continue treatment for reflux:               A. Omeprazole 40 mg 1-2 times a day             B. Add famotidine 40 mg in evening   3. Continue the following if needed:               A. antihistamine              B. Nasal saline             C. Mucinex DM - 2 tablets 2 times per day             D. Xopenx HFA  4. Start prednisone 10 mg - 1 tablet 1 time per day for 10 days only

## 2021-03-16 NOTE — Progress Notes (Signed)
Office Visit Note   Patient: Cindy Arias           Date of Birth: 1946-07-03           MRN: [id number] Visit Date: 03/16/2021              Requested by: Cari Caraway, [place],  [place] [address] PCP: Cari Caraway, MD   Assessment & Plan: Visit Diagnoses:  1. Hammertoe of second toe of right foot     Plan: 1 week status post release of right second hammertoe with fusion of the PIP joint for a fixed flexion deformity.  Doing well.  Little if any pain.  Normal sensibility and capillary refill to second toe.  Pin in place without evidence of infection.  Redressed the foot and applied mupropicin to the tip of the pin.  Continue with a wooden shoe with some limited weightbearing.  Office 3 weeks  Follow-Up Instructions: Return in about 3 weeks (around 04/06/2021).   Orders:  No orders of the defined types were placed in this encounter.  No orders of the defined types were placed in this encounter.     Procedures: No procedures performed   Clinical Data: No additional findings.   Subjective: Chief Complaint  Patient presents with  . Right Foot - Follow-up    Second hammertoe correction 03/09/2021  Patient presents today for follow up on her right foot. She had a right second hammertoe correction on 03/09/2021. Patient states that she is doing well. She only had to take pain medicine once.   HPI  Review of Systems   Objective: Vital Signs: Ht 5' (1.524 m)   Wt 116 lb (52.6 kg)   LMP 02/13/2012   BMI 22.65 kg/m   Physical Exam  Ortho Exam right second hammertoe incision intact.  Stitches removed and Steri-Strips applied over benzoin.  Minimal swelling of toe.  Normal sensibility and capillary refill.  Tip of pen intact reapplied a new dressing covering the pen  Specialty Comments:  No specialty comments available.  Imaging: No results found.   PMFS History: Patient Active Problem List   Diagnosis Date Noted  . Hammertoe of second  toe of right foot 01/11/2021  . Pain in right foot 01/05/2020  . Primary osteoarthritis of left knee 11/04/2018  . History of left knee replacement 11/04/2018  . Bilateral primary osteoarthritis of knee 02/21/2018  . 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 Malignant neoplasm of overlapping sites of right breast in female, estrogen receptor positive ([place]) 11/19/2016   Past Medical History:  Diagnosis Date  . Arthritis    "bone on bone'  left knee  . Asthma    LAST FLARE UP 3 YRS AGO  . Breast cancer ([facility])    RIGHT BREAST  . Colitis   . Dysphagia   . Dyspnea    NONE IN 3 YRS  . Esophageal stricture   . GERD (gastroesophageal reflux disease)   . History of infertility   . History of radiation therapy 12/31/16- 01/21/17   Right Breast 42.56 Gy in 16 fractions  . Osteopenia   . Pneumonia   . Rosacea   . Ulcerative colitis ([facility])     Family History  Problem Relation Age of Onset  . Cancer Mother        Liver- 2003 Lymphoma  . Rheum arthritis Mother   . Stroke Father   . Diabetes Maternal  Grandfather        Type 2   . Heart disease Maternal Grandfather   . Hypertension Maternal Grandmother   . Heart disease Maternal Grandmother   . Osteoporosis Maternal Grandmother   . Anesthesia problems Neg Hx     Past Surgical History:  Procedure Laterality Date  . BREAST LUMPECTOMY WITH RADIOACTIVE SEED AND SENTINEL LYMPH NODE BIOPSY Right 11/21/2016   Procedure: BREAST LUMPECTOMY WITH RADIOACTIVE SEED AND SENTINEL LYMPH NODE BIOPSY;  Surgeon: Excell Seltzer, MD;  Location: [facility];  Service: General;  Laterality: Right;  . BREAST SURGERY     RIGHT BREAST LUMPECTOMY  . BUNIONECTOMY WITH HAMMERTOE RECONSTRUCTION Left 07/2014  . CATARACT EXTRACTION Right 10/2015  . ESOPHAGEAL DILATION    . EYE SURGERY     OD  . JOINT REPLACEMENT    . Repair of Chiari Malformation  02/05/13  . SUBOCCIPITAL CRANIECTOMY  CERVICAL LAMINECTOMY  02/13/2012   Procedure: SUBOCCIPITAL CRANIECTOMY CERVICAL LAMINECTOMY/DURAPLASTY;  Surgeon: Ophelia Charter, MD;  Location: [facility];  Service: Neurosurgery;  Laterality: N/A;  Suboccipital craniectomy with upper cervical laminectomy and duraplasty  . TOTAL KNEE ARTHROPLASTY Left 11/04/2018  . TOTAL KNEE ARTHROPLASTY Left 11/04/2018   Procedure: LEFT TOTAL KNEE ARTHROPLASTY;  Surgeon: Garald Balding, MD;  Location: [facility];  Service: Orthopedics;  Laterality: Left;  . TUBAL LIGATION  1987   Social History   Occupational History  . Not on file  Tobacco Use  . Smoking status: Never Smoker  . Smokeless tobacco: Never Used  . Tobacco comment: she smoked for a few months in college  Vaping Use  . Vaping Use: Never used  Substance and Sexual Activity  . Alcohol use: Yes    Comment: 1 glass of wine monthly  . Drug use: No    Comment: Smoked in college ~ 2 years  . Sexual activity: Not on file

## 2021-03-16 NOTE — Telephone Encounter (Signed)
Pt is doing Symbicort and xopenex, and omeprazole. She is currently not taking a antihistamine, mucinex dm or pepcid. I went over directions for pepcid as she will purchase it otc and also told her to start a antihistamine (claritin, allegra, zyrtec, xyzal) which she can purchase otc. Also informed pt to take the [facility] DM 2 tablets bid and she will also purchase this otc. She will call us back in a few days with an update. She has not been on prednisone in a long time she stated.

## 2021-03-16 NOTE — Telephone Encounter (Signed)
Spoke with pt she is doing normal inahler xopenex and daily inhaler she did not know the name of it and its not helping with the cough. Its just a dry cough. She was in the office at orthopedic getting her leg rewrapped as she has just had surgery recently on it

## 2021-03-21 ENCOUNTER — Other Ambulatory Visit: Payer: Self-pay | Admitting: Oncology

## 2021-04-05 ENCOUNTER — Ambulatory Visit (INDEPENDENT_AMBULATORY_CARE_PROVIDER_SITE_OTHER): Payer: Medicare Other | Admitting: Orthopaedic Surgery

## 2021-04-05 ENCOUNTER — Encounter: Payer: Self-pay | Admitting: Orthopaedic Surgery

## 2021-04-05 ENCOUNTER — Other Ambulatory Visit: Payer: Self-pay

## 2021-04-05 DIAGNOSIS — M2041 Other hammer toe(s) (acquired), right foot: Secondary | ICD-10-CM

## 2021-04-05 NOTE — Progress Notes (Signed)
Office Visit Note   Patient: Cindy Arias           Date of Birth: [birth year]           MRN: [id number] Visit Date: 04/05/2021              Requested by: Cari Caraway, [place],  [place] [address] PCP: Cari Caraway, MD   Assessment & Plan: Visit Diagnoses:  1. Hammertoe of second toe of right foot     Plan: 1 month status post correction of right second hammertoe that included a dorsal capsulotomy of the metatarsal phalangeal joint and Z-lengthening of the extensor tendon and fusion of a fixed flexion contracture of the PIP joint.  Doing very well.  Wounds are healing without problem.  Good capillary refill and normal sensation to toe.  Pin was removed and toes were taped together.  Regular shoe.  Office 2 weeks  Follow-Up Instructions: Return in about 2 weeks (around 04/19/2021).   Orders:  No orders of the defined types were placed in this encounter.  No orders of the defined types were placed in this encounter.     Procedures: No procedures performed   Clinical Data: No additional findings.   Subjective: Chief Complaint  Patient presents with  . Right 2nd Toe - Follow-up  No complaints now 1 month status post correction of right second hammertoe  HPI  Review of Systems   Objective: Vital Signs: LMP 02/13/2012   Physical Exam  Ortho Exam longitudinal incision right second toe is healed without any problems.  No evidence of pin tract infection.  Nice alignment of the second toe.  Has a bunion that is not symptomatic and slight hallux valgus.  Neurologically intact with good capillary refill  Specialty Comments:  No specialty comments available.  Imaging: No results found.   PMFS History: Patient Active Problem List   Diagnosis Date Noted  . Hammertoe of second toe of right foot 01/11/2021  . Pain in right foot 01/05/2020  . Primary osteoarthritis of left knee 11/04/2018  . History of left knee replacement 11/04/2018  .  Bilateral primary osteoarthritis of knee 02/21/2018  . 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 Arthritis    "bone on bone'  left knee  . Asthma    LAST FLARE UP 3 YRS AGO  . Breast cancer ([facility])    RIGHT BREAST  . Colitis   . Dysphagia   . Dyspnea    NONE IN 3 YRS  . Esophageal stricture   . GERD (gastroesophageal reflux disease)   . History of infertility   . History of radiation therapy 12/31/16- 01/21/17   Right Breast 42.56 Gy in 16 fractions  . Osteopenia   . Pneumonia   . Rosacea   . Ulcerative colitis ([facility])     Family History  Problem Relation Age of Onset  . Cancer Mother        Liver- 2003 Lymphoma  . Rheum arthritis Mother   . Stroke Father   . Diabetes Maternal Grandfather        Type 2   . Heart disease Maternal Grandfather   . Hypertension Maternal Grandmother   . Heart disease Maternal Grandmother   . Osteoporosis Maternal Grandmother   . Anesthesia problems  Neg Hx     Past Surgical History:  Procedure Laterality Date  . BREAST LUMPECTOMY WITH RADIOACTIVE SEED AND SENTINEL LYMPH NODE BIOPSY Right 11/21/2016   Procedure: BREAST LUMPECTOMY WITH RADIOACTIVE SEED AND SENTINEL LYMPH NODE BIOPSY;  Surgeon: Excell Seltzer, MD;  Location: [facility];  Service: General;  Laterality: Right;  . BREAST SURGERY     RIGHT BREAST LUMPECTOMY  . BUNIONECTOMY WITH HAMMERTOE RECONSTRUCTION Left 07/2014  . CATARACT EXTRACTION Right 10/2015  . ESOPHAGEAL DILATION    . EYE SURGERY     OD  . JOINT REPLACEMENT    . Repair of Chiari Malformation  02/05/13  . SUBOCCIPITAL CRANIECTOMY CERVICAL LAMINECTOMY  02/13/2012   Procedure: SUBOCCIPITAL CRANIECTOMY CERVICAL LAMINECTOMY/DURAPLASTY;  Surgeon: Ophelia Charter, MD;  Location: [facility];   Service: Neurosurgery;  Laterality: N/A;  Suboccipital craniectomy with upper cervical laminectomy and duraplasty  . TOTAL KNEE ARTHROPLASTY Left 11/04/2018  . TOTAL KNEE ARTHROPLASTY Left 11/04/2018   Procedure: LEFT TOTAL KNEE ARTHROPLASTY;  Surgeon: Garald Balding, MD;  Location: [facility];  Service: Orthopedics;  Laterality: Left;  . TUBAL LIGATION  1987   Social History   Occupational History  . Not on file  Tobacco Use  . Smoking status: Never Smoker  . Smokeless tobacco: Never Used  . Tobacco comment: she smoked for a few months in college  Vaping Use  . Vaping Use: Never used  Substance and Sexual Activity  . Alcohol use: Yes    Comment: 1 glass of wine monthly  . Drug use: No    Comment: Smoked in college ~ 2 years  . Sexual activity: Not on file     Garald Balding, MD   Note - This record has been created using [employer].  Chart creation errors have been sought, but may not always  have been located. Such creation errors do not reflect on  the standard of medical care.

## 2021-04-27 ENCOUNTER — Ambulatory Visit (INDEPENDENT_AMBULATORY_CARE_PROVIDER_SITE_OTHER): Payer: Medicare Other | Admitting: Orthopaedic Surgery

## 2021-04-27 ENCOUNTER — Other Ambulatory Visit: Payer: Self-pay

## 2021-04-27 ENCOUNTER — Encounter: Payer: Self-pay | Admitting: Orthopaedic Surgery

## 2021-04-27 VITALS — Ht 60.0 in | Wt 116.0 lb

## 2021-04-27 DIAGNOSIS — M2041 Other hammer toe(s) (acquired), right foot: Secondary | ICD-10-CM

## 2021-04-27 NOTE — Progress Notes (Signed)
Office Visit Note   Patient: Cindy Arias           Date of Birth: July 10, 1946           MRN: [id number] Visit Date: 04/27/2021              Requested by: Cari Caraway, [place],  [place] [address] PCP: Cari Caraway, MD   Assessment & Plan: Visit Diagnoses:  1. Hammertoe of second toe of right foot     Plan: 5-week status post correction of right second hammertoe including a Z-lengthening of the extensor tendon at the level of the metacarpal phalangeal joint, dorsal capsulotomy of the metacarpal phalangeal joint and fusion of the IP joint.  Doing very well.  Incision is healed nicely and good capillary refill to the toe.  There is a bunion and there is some underlapping of the great on the second toe but we knew that before hand and it was elected not to consider any correction of that.  Its not symptomatic.  She is wearing good comfortable shoes.  I will plan to see her back as needed looks great and she can perform activity to tolerance  Follow-Up Instructions: Return if symptoms worsen or fail to improve.   Orders:  No orders of the defined types were placed in this encounter.  No orders of the defined types were placed in this encounter.     Procedures: No procedures performed   Clinical Data: No additional findings.   Subjective: Chief Complaint  Patient presents with   Right Foot - Follow-up    Right second hammertoe correction 03/09/2021  Patient presents today for a three week follow up on her right foot. She had a right second hammertoe correction on 03/09/2021. She is now 7 weeks out from surgery. She is doing well and has no complaints.  HPI  Review of Systems   Objective: Vital Signs: Ht 5' (1.524 m)   Wt 116 lb (52.6 kg)   LMP 02/13/2012   BMI 22.65 kg/m   Physical Exam  Ortho Exam right foot with second toe incision dorsally healing without problem.  Minimal swelling of the toe.  Good capillary refill.  There is  underlapping of the second toe with hallux valgus and bunion but its not uncomfortable and is not causing any skin problems.  Good sensation to the second and all toes  Specialty Comments:  No specialty comments available.  Imaging: No results found.   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, estrogen receptor positive ([facility]) 12/21/2016   Malignant neoplasm of overlapping sites of right breast in female, estrogen receptor positive ([facility]) 11/19/2016   Past Medical History:  Diagnosis Date   Arthritis    "bone on bone'  left knee   Asthma    LAST FLARE UP 3 YRS AGO   Breast cancer ([facility])    RIGHT BREAST   Colitis    Dysphagia    Dyspnea    NONE IN 3 YRS   Esophageal stricture    GERD (gastroesophageal reflux disease)    History of infertility    History of radiation therapy 12/31/16- 01/21/17  Right Breast 42.56 Gy in 16 fractions   Osteopenia    Pneumonia    Rosacea    Ulcerative colitis ([facility])     Family History  Problem Relation Age of Onset   Cancer Mother        Liver- 2003 Lymphoma   Rheum arthritis Mother    Stroke Father    Diabetes Maternal Grandfather        Type 2    Heart disease Maternal Grandfather    Hypertension Maternal Grandmother    Heart disease Maternal Grandmother    Osteoporosis Maternal Grandmother    Anesthesia problems Neg Hx     Past Surgical History:  Procedure Laterality Date   BREAST LUMPECTOMY WITH RADIOACTIVE SEED AND SENTINEL LYMPH NODE BIOPSY Right 11/21/2016   Procedure: BREAST LUMPECTOMY WITH RADIOACTIVE SEED AND SENTINEL LYMPH NODE BIOPSY;  Surgeon: Excell Seltzer, MD;  Location: [facility];  Service:  General;  Laterality: Right;   BREAST SURGERY     RIGHT BREAST LUMPECTOMY   BUNIONECTOMY WITH HAMMERTOE RECONSTRUCTION Left 07/2014   CATARACT EXTRACTION Right 10/2015   ESOPHAGEAL DILATION     EYE SURGERY     OD   JOINT REPLACEMENT     Repair of Chiari Malformation  02/05/13   SUBOCCIPITAL CRANIECTOMY CERVICAL LAMINECTOMY  02/13/2012   Procedure: SUBOCCIPITAL CRANIECTOMY CERVICAL LAMINECTOMY/DURAPLASTY;  Surgeon: Ophelia Charter, MD;  Location: [facility];  Service: Neurosurgery;  Laterality: N/A;  Suboccipital craniectomy with upper cervical laminectomy and duraplasty   TOTAL KNEE ARTHROPLASTY Left 11/04/2018   TOTAL KNEE ARTHROPLASTY Left 11/04/2018   Procedure: LEFT TOTAL KNEE ARTHROPLASTY;  Surgeon: Garald Balding, MD;  Location: [facility];  Service: Orthopedics;  Laterality: Left;   TUBAL LIGATION  1987   Social History   Occupational History   Not on file  Tobacco Use   Smoking status: Never   Smokeless tobacco: Never   Tobacco comments:    she smoked for a few months in college  Vaping Use   Vaping Use: Never used  Substance and Sexual Activity   Alcohol use: Yes    Comment: 1 glass of wine monthly   Drug use: No    Comment: Smoked in college ~ 2 years   Sexual activity: Not on file

## 2021-05-20 ENCOUNTER — Other Ambulatory Visit: Payer: Self-pay | Admitting: Allergy and Immunology

## 2021-06-08 ENCOUNTER — Ambulatory Visit (INDEPENDENT_AMBULATORY_CARE_PROVIDER_SITE_OTHER): Payer: Medicare Other | Admitting: Orthopaedic Surgery

## 2021-06-08 ENCOUNTER — Other Ambulatory Visit: Payer: Self-pay

## 2021-06-08 ENCOUNTER — Ambulatory Visit: Payer: Self-pay

## 2021-06-08 ENCOUNTER — Encounter: Payer: Self-pay | Admitting: Orthopaedic Surgery

## 2021-06-08 DIAGNOSIS — M1712 Unilateral primary osteoarthritis, left knee: Secondary | ICD-10-CM

## 2021-06-08 DIAGNOSIS — Z96652 Presence of left artificial knee joint: Secondary | ICD-10-CM

## 2021-06-08 DIAGNOSIS — M25562 Pain in left knee: Secondary | ICD-10-CM | POA: Diagnosis not present

## 2021-06-08 NOTE — Progress Notes (Signed)
Office Visit Note   Patient: Cindy Arias           Date of Birth: Dec 24, 1945           MRN: [id number] Visit Date: 06/08/2021              Requested by: Cari Caraway, [place],  [place] [address] PCP: Cari Caraway, MD   Assessment & Plan: Visit Diagnoses:  1. Acute pain of left knee   2. Primary osteoarthritis of left knee   3. History of left knee replacement     Plan: 2 and half years status post primary left total knee replacement and actually doing relatively well.  Verbally had noticed some swelling and discomfort in her knee recently with some possible fluid in her knee.  X-rays were negative for any obvious problems.  There is nice alignment and excellent glue mantle.  Exam was ideal with full extension at least 205 degrees of flexion.  There is no opening with varus or valgus stress.  She may have had a very minimal effusion but the knee was not hot warm or red and no localized areas of tenderness.  I do not see any problem with her knee and I suspect the what she was experiencing hopefully temporary and related to soft tissue strain.  Discussed exercises and even anti-inflammatory medicines or Voltaren gel should she have pain to the point of compromise  Follow-Up Instructions: Return if symptoms worsen or fail to improve.   Orders:  Orders Placed This Encounter  Procedures   XR KNEE 3 VIEW LEFT   No orders of the defined types were placed in this encounter.     Procedures: No procedures performed   Clinical Data: No additional findings.   Subjective: Chief Complaint  Patient presents with   Left Knee - Pain  Patient presents today for left knee pain. She said that it feels stiff and painful. This started about two weeks ago and has worsened. She is not taking anything for pain. She has a history of left total knee arthroplasty, done in January 2020. Her husband is getting ready to have a spinal surgery and will be his caregiver. She  wants to make sure she is feeling her best to take care of him.  No injury or trauma.  No fever or chills  HPI  Review of Systems   Objective: Vital Signs: LMP 02/13/2012   Physical Exam Constitutional:      Appearance: She is well-developed.  Pulmonary:     Effort: Pulmonary effort is normal.  Skin:    General: Skin is warm and dry.  Neurological:     Mental Status: She is alert and oriented to person, place, and time.  Psychiatric:        Behavior: Behavior normal.    Ortho Exam awake alert and oriented x3.  Comfortable sitting left knee incision is healed nicely without any problems.  There may have been very minimal effusion but full quick extension of flexed over 105 degrees without any opening with varus or valgus stress.  Knee was not hot or warm.  No localized areas of tenderness.  No popliteal pain or mass.  No calf pain.  Specialty Comments:  No specialty comments available.  Imaging: XR KNEE 3 VIEW LEFT  Result Date: 06/08/2021 Films of the left total knee replacement were performed in 3 projections.  Excellent alignment.  No evidence of any complications.  There is a nice glue  mantle.  Patella tracks in the midline.  No evidence of loosening of either the femoral or tibial components.    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, estrogen receptor positive ([facility]) 11/19/2016   Past Medical History:  Diagnosis Date   Arthritis    "bone on bone'  left knee   Asthma    LAST FLARE UP 3 YRS AGO   Breast cancer ([facility])    RIGHT BREAST   Colitis     Dysphagia    Dyspnea    NONE IN 3 YRS   Esophageal stricture    GERD (gastroesophageal reflux disease)    History of infertility    History of radiation therapy 12/31/16- 01/21/17   Right Breast 42.56 Gy in 16 fractions   Osteopenia    Pneumonia    Rosacea    Ulcerative colitis ([facility] [place])     Family History  Problem Relation Age of Onset   Cancer Mother        Liver- 2003 Lymphoma   Rheum arthritis Mother    Stroke Father    Diabetes Maternal Grandfather        Type 2    Heart disease Maternal Grandfather    Hypertension Maternal Grandmother    Heart disease Maternal Grandmother    Osteoporosis Maternal Grandmother    Anesthesia problems Neg Hx     Past Surgical History:  Procedure Laterality Date   BREAST LUMPECTOMY WITH RADIOACTIVE SEED AND SENTINEL LYMPH NODE BIOPSY Right 11/21/2016   Procedure: BREAST LUMPECTOMY WITH RADIOACTIVE SEED AND SENTINEL LYMPH NODE BIOPSY;  Surgeon: Excell Seltzer, MD;  Location: [facility];  Service: General;  Laterality: Right;   BREAST SURGERY     RIGHT BREAST LUMPECTOMY   BUNIONECTOMY WITH HAMMERTOE RECONSTRUCTION Left 07/2014   CATARACT EXTRACTION Right 10/2015   ESOPHAGEAL DILATION     EYE SURGERY     OD   JOINT REPLACEMENT     Repair of Chiari Malformation  02/05/13   SUBOCCIPITAL CRANIECTOMY CERVICAL LAMINECTOMY  02/13/2012   Procedure: SUBOCCIPITAL CRANIECTOMY CERVICAL LAMINECTOMY/DURAPLASTY;  Surgeon: Ophelia Charter, MD;  Location[facility];  Service: Neurosurgery;  Laterality: N/A;  Suboccipital craniectomy with upper cervical laminectomy and duraplasty   TOTAL KNEE ARTHROPLASTY Left 11/04/2018   TOTAL KNEE ARTHROPLASTY Left 11/04/2018   Procedure: LEFT TOTAL KNEE ARTHROPLASTY;  Surgeon: Garald Balding, MD;  Location: [facility];  Service: Orthopedics;  Laterality: Left;   TUBAL LIGATION  1987   Social History   Occupational History   Not on file  Tobacco Use   Smoking status: Never   Smokeless tobacco: Never    Tobacco comments:    she smoked for a few months in college  Vaping Use   Vaping Use: Never used  Substance and Sexual Activity   Alcohol use: Yes    Comment: 1 glass of wine monthly   Drug use: No    Comment: Smoked in college ~ 2 years   Sexual activity: Not on file

## 2021-06-28 ENCOUNTER — Other Ambulatory Visit: Payer: Self-pay | Admitting: Oncology

## 2021-07-18 ENCOUNTER — Ambulatory Visit (INDEPENDENT_AMBULATORY_CARE_PROVIDER_SITE_OTHER): Payer: Medicare Other | Admitting: Allergy and Immunology

## 2021-07-18 ENCOUNTER — Encounter: Payer: Self-pay | Admitting: Allergy and Immunology

## 2021-07-18 ENCOUNTER — Other Ambulatory Visit: Payer: Self-pay

## 2021-07-18 VITALS — BP 128/64 | HR 64 | Temp 98.3°F | Ht 60.0 in | Wt 115.6 lb

## 2021-07-18 DIAGNOSIS — J3089 Other allergic rhinitis: Secondary | ICD-10-CM | POA: Diagnosis not present

## 2021-07-18 DIAGNOSIS — K219 Gastro-esophageal reflux disease without esophagitis: Secondary | ICD-10-CM

## 2021-07-18 DIAGNOSIS — J4540 Moderate persistent asthma, uncomplicated: Secondary | ICD-10-CM

## 2021-07-18 MED ORDER — BUDESONIDE-FORMOTEROL FUMARATE 160-4.5 MCG/ACT IN AERO: 2.0000 | INHALATION_SPRAY | Freq: Two times a day (BID) | RESPIRATORY_TRACT | 5 refills | Status: DC

## 2021-07-18 MED ORDER — LEVALBUTEROL TARTRATE 45 MCG/ACT IN AERO: INHALATION_SPRAY | RESPIRATORY_TRACT | 3 refills | Status: DC

## 2021-07-18 MED ORDER — FAMOTIDINE 40 MG PO TABS: 40.0000 mg | ORAL_TABLET | Freq: Every day | ORAL | 5 refills | Status: DC

## 2021-07-18 NOTE — Patient Instructions (Signed)
  1. Treat inflammation:   A. Symbicort 160-2 inhalations 1-2 times a day  2. Continue treatment for reflux:   A. Omeprazole 40 mg 1-2 times a day  B. Add famotidine 40 mg in evening if needed  3. Continue the following if needed:   A. antihistamine   B. Nasal saline  C. Mucinex DM  D. Xopenx HFA  4. Return to clinic in 1 year or earlier if problem   5.  Obtain fall flu vaccine and Covid booster vaccine

## 2021-07-18 NOTE — Progress Notes (Signed)
[facility]   Follow-up Note  Referring Provider: Cari Caraway, MD Primary Provider: Cari Caraway, MD Date of Office Visit: 07/18/2021  Subjective:   Cindy Arias (DOB: [birth year]) is a 75 y.o. female who returns to [facility] on 07/18/2021 in re-evaluation of the following:  HPI: Cindy Arias returns to this clinic in evaluation of asthma, allergic rhinitis, and LPR.  Her last visit to this clinic was 19 July 2020.  She has had an excellent interval of time since her last visit and has not required either a systemic steroid or antibiotic for any type of airway issue and she rarely uses a short acting bronchodilator and she can exert herself to the extent that her musculoskeletal issues allow her to do so (right knee issue) with no problem and overall feels as though she is doing very well while using Symbicort on a consistent basis.  As well, her reflux is under excellent control while using omeprazole just 1 time per day at this point in time.  She has received [facility] vaccines.  She is caring for her husband who just had surgery for spinal stenosis and unfortunately had a secondary wound infection which is requiring home infusion of antibiotics.  Allergies as of 07/18/2021       Reactions   Penicillins Hives, Other (See Comments)   UNSPECIFIED CHILDHOOD REACTION  Had reaction as a child and as an adult. Had additional reaction but not clear on the specifics   Sulfa Antibiotics Hives   Albuterol Other (See Comments)   UNSPECIFIED REACTION  States she cannot tolerate albuterol without side effects        Medication List    anastrozole 1 MG tablet Commonly known as: ARIMIDEX TAKE 1 TABLET ONCE DAILY.   aspirin 325 MG EC tablet Take 1 tablet (325 mg total) by mouth daily with breakfast.   CALCIUM CITRATE-VITAMIN D PO Take 1 tablet by mouth daily.   clindamycin 300 MG capsule Commonly  known as: CLEOCIN TAKE 2 CAPSULES 1 HOUR BEFORE DENTAL APPOINTMENT.   fluticasone 50 MCG/ACT nasal spray Commonly known as: FLONASE Place 2 sprays into both nostrils daily as needed for allergies or rhinitis.   HYDROcodone-acetaminophen 5-325 MG tablet Commonly known as: NORCO/VICODIN Take 1 tablet by mouth every 6 (six) hours as needed for moderate pain.   levalbuterol 45 MCG/ACT inhaler Commonly known as: XOPENEX HFA Inhale two puffs every 4-6 hours if needed for cough or wheeze   mesalamine 1.2 g EC tablet Commonly known as: LIALDA Take 1.2 g by mouth daily with breakfast.   multivitamins with iron Tabs tablet Take 1 tablet by mouth daily.   omeprazole 40 MG capsule Commonly known as: PRILOSEC TAKE 1 CAPSULE TWICE DAILY AS DIRECTED.   Pfizer-BioNT COVID-19 Vac-TriS Susp injection Generic drug: COVID-19 mRNA Vac-TriS (Pfizer) Inject into the muscle.   sodium chloride 0.65 % nasal spray Commonly known as: OCEAN Place 1 spray into the nose daily as needed for congestion.   Symbicort 160-4.5 MCG/ACT inhaler Generic drug: budesonide-formoterol 2 PUFFS EVERY 12 HOURS TO PREVENT COUGH/WHEEZE. RINSE, GARGLE, AND SPIT AFTER USE.   vitamin E 180 MG (400 UNITS) capsule Take 400 Units by mouth daily.    Past Medical History:  Diagnosis Date   Arthritis    "bone on bone'  left knee   Asthma    LAST FLARE UP 3 YRS AGO   Breast cancer ([facility])  RIGHT BREAST   Colitis    Dysphagia    Dyspnea    NONE IN 3 YRS   Esophageal stricture    GERD (gastroesophageal reflux disease)    History of infertility    History of radiation therapy 12/31/16- 01/21/17   Right Breast 42.56 Gy in 16 fractions   Osteopenia    Pneumonia    Rosacea    Ulcerative colitis ([facility])     Past Surgical History:  Procedure Laterality Date   BREAST LUMPECTOMY WITH RADIOACTIVE SEED AND SENTINEL LYMPH NODE BIOPSY Right 11/21/2016   Procedure: BREAST LUMPECTOMY WITH RADIOACTIVE SEED AND SENTINEL LYMPH NODE  BIOPSY;  Surgeon: Excell Seltzer, MD;  Location: [facility];  Service: General;  Laterality: Right;   BREAST SURGERY     RIGHT BREAST LUMPECTOMY   BUNIONECTOMY WITH HAMMERTOE RECONSTRUCTION Left 07/2014   CATARACT EXTRACTION Right 10/2015   ESOPHAGEAL DILATION     EYE SURGERY     OD   JOINT REPLACEMENT     Repair of Chiari Malformation  02/05/13   SUBOCCIPITAL CRANIECTOMY CERVICAL LAMINECTOMY  02/13/2012   Procedure: SUBOCCIPITAL CRANIECTOMY CERVICAL LAMINECTOMY/DURAPLASTY;  Surgeon: Ophelia Charter, MD;  Location: [facility];  Service: Neurosurgery;  Laterality: N/A;  Suboccipital craniectomy with upper cervical laminectomy and duraplasty   TOTAL KNEE ARTHROPLASTY Left 11/04/2018   TOTAL KNEE ARTHROPLASTY Left 11/04/2018   Procedure: LEFT TOTAL KNEE ARTHROPLASTY;  Surgeon: Garald Balding, MD;  Location: [facility];  Service: Orthopedics;  Laterality: Left;   TUBAL LIGATION  1987    Review of systems negative except as noted in HPI / PMHx or noted below:  Review of Systems  Constitutional: Negative.   HENT: Negative.    Eyes: Negative.   Respiratory: Negative.    Cardiovascular: Negative.   Gastrointestinal: Negative.   Genitourinary: Negative.   Musculoskeletal: Negative.   Skin: Negative.   Neurological: Negative.   Endo/Heme/Allergies: Negative.   Psychiatric/Behavioral: Negative.      Objective:   Vitals:   07/18/21 1115  BP: 128/64  Pulse: 64  Temp: 98.3 F (36.8 C)  SpO2: 96%   Height: 5' (152.4 cm)  Weight: 115 lb 9.6 oz (52.4 kg)   Physical Exam Constitutional:      Appearance: She is not diaphoretic.  HENT:     Head: Normocephalic.     Right Ear: Tympanic membrane, ear canal and external ear normal.     Left Ear: Tympanic membrane, ear canal and external ear normal.     Nose: Nose normal. No mucosal edema or rhinorrhea.     Mouth/Throat:     Pharynx: Uvula midline. No oropharyngeal exudate.  Eyes:     Conjunctiva/sclera:  Conjunctivae normal.  Neck:     Thyroid: No thyromegaly.     Trachea: Trachea normal. No tracheal tenderness or tracheal deviation.  Cardiovascular:     Rate and Rhythm: Normal rate and regular rhythm.     Heart sounds: Normal heart sounds, S1 normal and S2 normal. No murmur heard. Pulmonary:     Effort: No respiratory distress.     Breath sounds: Normal breath sounds. No stridor. No wheezing or rales.  Lymphadenopathy:     Head:     Right side of head: No tonsillar adenopathy.     Left side of head: No tonsillar adenopathy.     Cervical: No cervical adenopathy.  Skin:    Findings: No erythema or rash.     Nails: There is no clubbing.  Neurological:     Mental Status: She is alert.    Diagnostics:    Spirometry was performed and demonstrated an FEV1 of 1.67 at 93 % of predicted.  Assessment and Plan:   1. Asthma, moderate persistent, well-controlled   2. Other allergic rhinitis   3. LPRD (laryngopharyngeal reflux disease)     1. Treat inflammation:   A. Symbicort 160-2 inhalations 1-2 times a day  2. Continue treatment for reflux:   A. Omeprazole 40 mg 1-2 times a day  B. Add famotidine 40 mg in evening if needed  3. Continue the following if needed:   A. antihistamine   B. Nasal saline  C. Mucinex DM  D. Xopenx HFA  4. Return to clinic in 1 year or earlier if problem   5.  Obtain fall flu vaccine and Covid booster vaccine  Yazlyn appears to be doing very well on her current therapy.  She has a very good understanding of her disease state and how her medications work and appropriate dosing of her medications depending on disease activity.  She will continue on the plan noted above which includes anti-inflammatory medications for her airway and therapy directed against reflux and I will see her back in this clinic in 1 year or earlier if there is a problem.  Allena Katz, MD Allergy / Immunology [facility]

## 2021-07-19 ENCOUNTER — Encounter: Payer: Self-pay | Admitting: Allergy and Immunology

## 2021-07-20 ENCOUNTER — Other Ambulatory Visit (HOSPITAL_BASED_OUTPATIENT_CLINIC_OR_DEPARTMENT_OTHER): Payer: Self-pay

## 2021-07-20 MED ORDER — INFLUENZA VAC A&B SA ADJ QUAD 0.5 ML IM PRSY
PREFILLED_SYRINGE | INTRAMUSCULAR | 0 refills | Status: DC
  Filled 2021-07-20: qty 0.5, 1d supply, fill #0

## 2021-07-24 ENCOUNTER — Telehealth: Payer: Self-pay | Admitting: Allergy and Immunology

## 2021-07-24 ENCOUNTER — Other Ambulatory Visit (HOSPITAL_BASED_OUTPATIENT_CLINIC_OR_DEPARTMENT_OTHER): Payer: Self-pay

## 2021-07-24 ENCOUNTER — Other Ambulatory Visit: Payer: Self-pay | Admitting: *Deleted

## 2021-07-24 MED ORDER — LEVALBUTEROL TARTRATE 45 MCG/ACT IN AERO: INHALATION_SPRAY | RESPIRATORY_TRACT | 1 refills | Status: DC

## 2021-07-24 NOTE — Telephone Encounter (Signed)
Refill for Xopenex has been sent in to [facility]. Called and spoke with patient and she is aware and verbalized understanding.

## 2021-07-24 NOTE — Telephone Encounter (Signed)
Pt called requesting clarification on medications. Pt states she picked up her famotadine from the pharmacy but was unaware as to why she had that sent in. I did read Dr. Bruna Potter note to her "2. Continue treatment for reflux:               A. Omeprazole 40 mg 1-2 times a day             B. Add famotidine 40 mg in evening if needed"  Patient verbalized understanding. Pt then stated she needs Xopenex to be sent in however she was unable to get the medication from the pharmacy. I did let her know it was sent in 07/18/2021. Patient is requesting it be sent in again.  [place]   Patient is requesting a call when medication is sent in, [phone]

## 2021-07-31 ENCOUNTER — Other Ambulatory Visit: Payer: Self-pay | Admitting: *Deleted

## 2021-07-31 MED ORDER — LEVALBUTEROL TARTRATE 45 MCG/ACT IN AERO: INHALATION_SPRAY | RESPIRATORY_TRACT | 1 refills | Status: DC

## 2021-07-31 NOTE — Telephone Encounter (Signed)
Resent the prescription and called and spoke with the pharmacy and he confirmed that they received the prescription on 07/24/21. Called and left a voicemail asking for patient to return call to inform.

## 2021-07-31 NOTE — Telephone Encounter (Signed)
Patient called and said [facility] never received a prescription for Xopenex. She checked with them today, 07/31/21, and they said they never received it. I told her on our end, it shows a receipt that they received it, but I would have someone send it again.

## 2021-07-31 NOTE — Telephone Encounter (Signed)
Patient called back about this message. I read her the message from [place]. Patient verbalized understanding and said she will call the pharmacy again.

## 2021-08-10 ENCOUNTER — Ambulatory Visit: Payer: Medicare Other | Attending: Internal Medicine

## 2021-08-10 ENCOUNTER — Other Ambulatory Visit (HOSPITAL_BASED_OUTPATIENT_CLINIC_OR_DEPARTMENT_OTHER): Payer: Self-pay

## 2021-08-10 DIAGNOSIS — Z23 Encounter for immunization: Secondary | ICD-10-CM

## 2021-08-10 MED ORDER — PFIZER COVID-19 VAC BIVALENT 30 MCG/0.3ML IM SUSP
INTRAMUSCULAR | 0 refills | Status: DC
  Filled 2021-08-10: qty 0.3, 1d supply, fill #0

## 2021-08-10 NOTE — Progress Notes (Signed)
   Covid-19 Vaccination Clinic  Name:  MADHAVI HAMBLEN    MRN: [id number] DOB: 01/06/1946  08/10/2021  Ms. Kishi was observed post Covid-19 immunization for 15 minutes without incident. She was provided with Vaccine Information Sheet and instruction to access the V-Safe system.   Ms. Clenney was instructed to call [phone] with any severe reactions post vaccine: Difficulty breathing  Swelling of face and throat  A fast heartbeat  A bad rash all over body  Dizziness and weakness

## 2021-08-28 ENCOUNTER — Other Ambulatory Visit: Payer: Self-pay

## 2021-08-28 DIAGNOSIS — Z170 Estrogen receptor positive status [ER+]: Secondary | ICD-10-CM

## 2021-08-28 DIAGNOSIS — C50811 Malignant neoplasm of overlapping sites of right female breast: Secondary | ICD-10-CM

## 2021-08-28 NOTE — Progress Notes (Signed)
[place]  [phone] [phone]     ID: Cindy Arias DOB: 21-Mar-1946  MR#: [id number]  SWN#:[id number]  Patient Care Team: Cari Caraway, MD as PCP - General (Family Medicine) Shalamar Plourde, Virgie Dad, MD as Consulting Physician (Oncology) Excell Seltzer, MD (Inactive) as Consulting Physician (General Surgery) Eppie Gibson, MD as Attending Physician (Radiation Oncology) Luberta Mutter, MD as Consulting Physician (Ophthalmology) Neldon Mc, Donnamarie Poag, MD as Consulting Physician (Allergy and Immunology) Arta Silence, MD as Consulting Physician (Gastroenterology) Delice Bison, Charlestine Massed, NP as Nurse Practitioner (Hematology and Oncology) Garald Balding, MD as Consulting Physician (Orthopedic Surgery) OTHER MD:  CHIEF COMPLAINT: Estrogen receptor positive breast cancer  CURRENT TREATMENT: Completing 5 years of anastrozole   INTERVAL HISTORY: Cindy Arias returns today for follow-up of her estrogen receptor positive breast cancer.   She continues on anastrozole.  She tolerates this well, with no unusual side effects.  Specifically hot flashes are not a major problem and she does not have significant vaginal dryness issues.  Since her last visit, she underwent bilateral diagnostic mammography with tomography at [facility] on 01/18/2021 showing: breast density category B; no evidence of malignancy in either breast.  Her most recent bone density scan from 12/24/2017 showed osteopenia. She was started on ibandronate by her PCP and received Zometa on 03/02/2021. She continues this with good tolerance.  She is due for repeat bone density later this year.   REVIEW OF SYSTEMS: Cindy Arias walks every day with a neighbor about 2-1/2 miles in about 40 minutes.  This is excellent.  Her husband had significant issues postop with osteomyelitis requiring infusions of antibiotics 3 times a day for many months and this kept her pretty closely tethered to the house but she is  now able to get away some and is planning a trip to [place] in April.  Aside from these issues a detailed review of systems today was stable   COVID 19 VACCINATION STATUS: Pfizer x4, most recently 07/2021   BREAST CANCER HISTORY: From the original intake note:  Cindy Arias had bilateral screening mammography with tomography at [facility] 10/04/2016 showing a possible new mass in the right breast central to the nipple. The breast density was category B. On 10/11/2016 she underwent ultrasonography of the right breast and axilla. This confirmed a 0.9 cm irregular mass in the central breast correlating with the mammography findings. The right axilla was sonographically benign.   On 10/17/2016 she underwent biopsy of the right breast mass in question, showing ([id number]) invasive ductal carcinoma, grade 2, estrogen receptor 100% positive, progesterone receptor 95% positive, both with strong staining intensity, with an MIB-1 of 10%, and no HER-2 amplification, the signals ratio 1.19 and the number per cell 1.90.  The patient's subsequent history is as detailed below   PAST MEDICAL HISTORY: Past Medical History:  Diagnosis Date   Arthritis    "bone on bone'  left knee   Asthma    LAST FLARE UP 3 YRS AGO   Breast cancer ([place])    RIGHT BREAST   Colitis    Dysphagia    Dyspnea    NONE IN 3 YRS   Esophageal stricture    GERD (gastroesophageal reflux disease)    History of infertility    History of radiation therapy 12/31/16- 01/21/17   Right Breast 42.56 Gy in 16 fractions   Osteopenia    Pneumonia    Rosacea    Ulcerative colitis ([place])     PAST SURGICAL HISTORY: Past Surgical History:  Procedure Laterality Date   BREAST LUMPECTOMY WITH RADIOACTIVE SEED AND SENTINEL LYMPH NODE BIOPSY Right 11/21/2016   Procedure: BREAST LUMPECTOMY WITH RADIOACTIVE SEED AND SENTINEL LYMPH NODE BIOPSY;  Surgeon: Excell Seltzer, MD;  Location: [place];  Service: General;  Laterality: Right;    BREAST SURGERY     RIGHT BREAST LUMPECTOMY   BUNIONECTOMY WITH HAMMERTOE RECONSTRUCTION Left 07/2014   CATARACT EXTRACTION Right 10/2015   ESOPHAGEAL DILATION     EYE SURGERY     OD   JOINT REPLACEMENT     Repair of Chiari Malformation  02/05/13   SUBOCCIPITAL CRANIECTOMY CERVICAL LAMINECTOMY  02/13/2012   Procedure: SUBOCCIPITAL CRANIECTOMY CERVICAL LAMINECTOMY/DURAPLASTY;  Surgeon: Ophelia Charter, MD;  Location: [facility];  Service: Neurosurgery;  Laterality: N/A;  Suboccipital craniectomy with upper cervical laminectomy and duraplasty   TOTAL KNEE ARTHROPLASTY Left 11/04/2018   TOTAL KNEE ARTHROPLASTY Left 11/04/2018   Procedure: LEFT TOTAL KNEE ARTHROPLASTY;  Surgeon: Garald Balding, MD;  Location: [place];  Service: Orthopedics;  Laterality: Left;   TUBAL LIGATION  1987    FAMILY HISTORY Family History  Problem Relation Age of Onset   Cancer Mother        Liver- 2003 Lymphoma   Rheum arthritis Mother    Stroke Father    Diabetes Maternal Grandfather        Type 2    Heart disease Maternal Grandfather    Hypertension Maternal Grandmother    Heart disease Maternal Grandmother    Osteoporosis Maternal Grandmother    Anesthesia problems Neg Hx   The patient's father died at the age of 41 FOLLOWING his stroke. The patient's mother died of non-Hodgkin's lymphoma at the age of 5. The patient had no brothers, 1 sister. There is no history of breast or ovarian cancer in the family   GYNECOLOGIC HISTORY:  Patient's last menstrual period was 02/13/2012. Menarche age 64, first live birth age 80. The patient is GX P1. She went through the change of life in her 31s. She did not take hormone replacement.   SOCIAL HISTORY:  Cindy Arias worked for [employer], for the [employer] agency in for [employer]. She is now retired and does a lot of volunteering. Her husband Cindy Arias is a retired Optometrist. They have been married 70 years as of January 2018. Their  daughter Cindy Arias teaches special education in [place]. The patient has one grandchild. She attends [employer]: In the absence of any documentation to the contrary, the patient's spouse is their HCPOA.    HEALTH MAINTENANCE: Social History   Tobacco Use   Smoking status: Never   Smokeless tobacco: Never   Tobacco comments:    she smoked for a few months in college  Vaping Use   Vaping Use: Never used  Substance Use Topics   Alcohol use: Yes    Comment: 1 glass of wine monthly   Drug use: No    Comment: Smoked in college ~ 2 years     Colonoscopy: Due 2019  PAP: Up-to-date  Bone density: mild osteopenia   Allergies  Allergen Reactions   Penicillins Hives and Other (See Comments)    UNSPECIFIED CHILDHOOD REACTION  Had reaction as a child and as an adult. Had additional reaction but not clear on the specifics   Sulfa Antibiotics Hives   Albuterol Other (See Comments)    UNSPECIFIED REACTION  States she cannot tolerate albuterol without side  effects    Current Outpatient Medications  Medication Sig Dispense Refill   anastrozole (ARIMIDEX) 1 MG tablet TAKE 1 TABLET ONCE DAILY. 90 tablet 0   aspirin EC 325 MG EC tablet Take 1 tablet (325 mg total) by mouth daily with breakfast. 30 tablet 0   budesonide-formoterol (SYMBICORT) 160-4.5 MCG/ACT inhaler Inhale 2 puffs into the lungs in the morning and at bedtime. 10.2 g 5   CALCIUM CITRATE-VITAMIN D PO Take 1 tablet by mouth daily.      clindamycin (CLEOCIN) 300 MG capsule TAKE 2 CAPSULES 1 HOUR BEFORE DENTAL APPOINTMENT. 2 capsule 0   COVID-19 mRNA bivalent vaccine, Pfizer, (PFIZER COVID-19 VAC BIVALENT) injection Inject into the muscle. 0.3 mL 0   COVID-19 mRNA Vac-TriS, Pfizer, SUSP injection Inject into the muscle. 0.3 mL 0   famotidine (PEPCID) 40 MG tablet Take 1 tablet (40 mg total) by mouth daily. 30 tablet 5   fluticasone (FLONASE) 50 MCG/ACT nasal spray Place 2 sprays into both nostrils  daily as needed for allergies or rhinitis.     HYDROcodone-acetaminophen (NORCO/VICODIN) 5-325 MG tablet Take 1 tablet by mouth every 6 (six) hours as needed for moderate pain. 25 tablet 0   influenza vaccine adjuvanted (FLUAD) 0.5 ML injection Inject into the muscle. 0.5 mL 0   levalbuterol (XOPENEX HFA) 45 MCG/ACT inhaler Inhale two puffs every 4-6 hours if needed for cough or wheeze 15 g 1   mesalamine (LIALDA) 1.2 G EC tablet Take 1.2 g by mouth daily with breakfast.      Multiple Vitamins-Iron (MULTIVITAMINS WITH IRON) TABS Take 1 tablet by mouth daily.     omeprazole (PRILOSEC) 40 MG capsule TAKE 1 CAPSULE TWICE DAILY AS DIRECTED. 60 capsule 3   sodium chloride (OCEAN) 0.65 % nasal spray Place 1 spray into the nose daily as needed for congestion.      vitamin E 400 UNIT capsule Take 400 Units by mouth daily.     No current facility-administered medications for this visit.    OBJECTIVE: White woman   Vitals:   08/29/21 1021  BP: 125/63  Pulse: 66  Resp: 18  Temp: 98.1 F (36.7 C)  SpO2: 98%      Body mass index is 22.21 kg/m.    ECOG FS:1 - Symptomatic but completely ambulatory  Sclerae unicteric, EOMs intact Wearing a mask No cervical or supraclavicular adenopathy Lungs no rales or rhonchi Heart regular rate and rhythm Abd soft, nontender, positive bowel sounds MSK no focal spinal tenderness, no upper extremity lymphedema Neuro: nonfocal, well oriented, appropriate affect Breasts: The right breast is status postlumpectomy and radiation.  There is no evidence of local recurrence.  The left breast and both axillae are benign   LAB RESULTS:  CMP     Component Value Date/Time   NA 137 08/29/2021 1007   NA 140 04/29/2017 1202   K 4.6 08/29/2021 1007   K 4.2 04/29/2017 1202   CL 105 08/29/2021 1007   CO2 25 08/29/2021 1007   CO2 25 04/29/2017 1202   GLUCOSE 95 08/29/2021 1007   GLUCOSE 97 04/29/2017 1202   BUN 20 08/29/2021 1007   BUN 19.2 04/29/2017 1202    CREATININE 0.74 08/29/2021 1007   CREATININE 0.8 04/29/2017 1202   CALCIUM 9.2 08/29/2021 1007   CALCIUM 9.8 04/29/2017 1202   PROT 7.0 08/29/2021 1007   PROT 7.3 04/29/2017 1202   ALBUMIN 4.1 08/29/2021 1007   ALBUMIN 4.1 04/29/2017 1202   AST 24 08/29/2021 1007  AST 21 04/29/2017 1202   ALT 21 08/29/2021 1007   ALT 20 04/29/2017 1202   ALKPHOS 67 08/29/2021 1007   ALKPHOS 80 04/29/2017 1202   BILITOT 0.5 08/29/2021 1007   BILITOT 0.56 04/29/2017 1202   GFRNONAA >60 08/29/2021 1007   GFRAA >60 08/25/2019 1004    INo results found for: SPEP, UPEP  Lab Results  Component Value Date   WBC 5.8 08/29/2021   NEUTROABS 3.2 08/29/2021   HGB 13.2 08/29/2021   HCT 39.6 08/29/2021   MCV 85.7 08/29/2021   PLT 259 08/29/2021      Chemistry      Component Value Date/Time   NA 137 08/29/2021 1007   NA 140 04/29/2017 1202   K 4.6 08/29/2021 1007   K 4.2 04/29/2017 1202   CL 105 08/29/2021 1007   CO2 25 08/29/2021 1007   CO2 25 04/29/2017 1202   BUN 20 08/29/2021 1007   BUN 19.2 04/29/2017 1202   CREATININE 0.74 08/29/2021 1007   CREATININE 0.8 04/29/2017 1202      Component Value Date/Time   CALCIUM 9.2 08/29/2021 1007   CALCIUM 9.8 04/29/2017 1202   ALKPHOS 67 08/29/2021 1007   ALKPHOS 80 04/29/2017 1202   AST 24 08/29/2021 1007   AST 21 04/29/2017 1202   ALT 21 08/29/2021 1007   ALT 20 04/29/2017 1202   BILITOT 0.5 08/29/2021 1007   BILITOT 0.56 04/29/2017 1202       No results found for: LABCA2  No components found for: LABCA125  No results for input(s): INR in the last 168 hours.  Urinalysis    Component Value Date/Time   COLORURINE AMBER (A) 10/27/2018 0945   APPEARANCEUR HAZY (A) 10/27/2018 0945   LABSPEC 1.026 10/27/2018 0945   PHURINE 5.0 10/27/2018 0945   GLUCOSEU NEGATIVE 10/27/2018 0945   HGBUR NEGATIVE 10/27/2018 0945   BILIRUBINUR NEGATIVE 10/27/2018 0945   KETONESUR NEGATIVE 10/27/2018 0945   PROTEINUR NEGATIVE 10/27/2018 0945   NITRITE  NEGATIVE 10/27/2018 0945   LEUKOCYTESUR NEGATIVE 10/27/2018 0945    STUDIES: No results found.   ELIGIBLE FOR AVAILABLE RESEARCH PROTOCOL: no  ASSESSMENT: 75 y.o. [place] woman status post right breast overlapping sites biopsy 10/17/2016 for a clinical T1b N0, stage IA invasive ductal carcinoma, grade 2, estrogen and progesterone receptor positive, HER-2 nonamplified, with an MIB-1 of 10%  (1)  s/p right lumpectomy and sentinel lymph node sampling 11/21/2016 for aT1c pN0, stage IA invasive ductal carcinoma, grade 2, with close but negative margins  (2) Oncotype DX score of 16 predict a 10 year risk of recurrence outside the breast of 10% if the patient's only systemic therapy is tamoxifen for 5 years. It also predicts no benefit from adjuvant chemotherapy.with will be  (3) adjuvant radiation 12/31/16 - 01/21/17 Site/dose:   Right breast treated to 42.56 Gy in 16 fractions  (4) anastrozole started 02/15/2017, completing 5 years March 2023  (a) bone density at [facility] 12/24/2017 shows a T score of -2.0.  (This is unchanged from baseline)  (b) repeat bone density 02/13/2021 showed a T score of -1.7   PLAN: Cindy Arias is coming up on 5 years from definitive surgery for her breast cancer with no evidence of disease recurrence.  This is very favorable.  She will complete 5 years of tamoxifen in March 2023.  She has 1 more refill and then she will stop that medication.  Her bone density is improved as detailed above and she continues bone building therapy through her  primary care physician  At this point I feel comfortable releasing her to her primary care physicians.  All she will need in terms of breast cancer follow-up is her yearly screening mammography and a yearly physician breast exam  We will be glad to see Cindy Arias again at any point in the future if and when the need arises but as of now are making no further routine appointments for her here.  Total encounter time 25  minutes.*   Makaylia Hewett, Virgie Dad, MD  08/29/21 10:44 AM Medical Oncology and Hematology [facility] [place], [place] [address] Tel. [phone]    Fax. [phone]    I, Wilburn Mylar, am acting as scribe for Dr. Virgie Dad. Damika Harmon.  I, Lurline Del MD, have reviewed the above documentation for accuracy and completeness, and I agree with the above.   *Total Encounter Time as defined by the Centers for Medicare and Medicaid Services includes, in addition to the face-to-face time of a patient visit (documented in the note above) non-face-to-face time: obtaining and reviewing outside history, ordering and reviewing medications, tests or procedures, care coordination (communications with other health care professionals or caregivers) and documentation in the medical record.

## 2021-08-29 ENCOUNTER — Other Ambulatory Visit: Payer: Self-pay

## 2021-08-29 ENCOUNTER — Inpatient Hospital Stay: Payer: Medicare Other

## 2021-08-29 ENCOUNTER — Inpatient Hospital Stay: Payer: Medicare Other | Attending: Oncology | Admitting: Oncology

## 2021-08-29 VITALS — BP 125/63 | HR 66 | Temp 98.1°F | Resp 18 | Ht 60.0 in | Wt 113.7 lb

## 2021-08-29 DIAGNOSIS — Z800 Family history of malignant neoplasm of digestive organs: Secondary | ICD-10-CM | POA: Insufficient documentation

## 2021-08-29 DIAGNOSIS — Z170 Estrogen receptor positive status [ER+]: Secondary | ICD-10-CM | POA: Insufficient documentation

## 2021-08-29 DIAGNOSIS — R232 Flushing: Secondary | ICD-10-CM | POA: Diagnosis not present

## 2021-08-29 DIAGNOSIS — Z7951 Long term (current) use of inhaled steroids: Secondary | ICD-10-CM | POA: Insufficient documentation

## 2021-08-29 DIAGNOSIS — M8580 Other specified disorders of bone density and structure, unspecified site: Secondary | ICD-10-CM | POA: Diagnosis not present

## 2021-08-29 DIAGNOSIS — Z823 Family history of stroke: Secondary | ICD-10-CM | POA: Insufficient documentation

## 2021-08-29 DIAGNOSIS — Z8262 Family history of osteoporosis: Secondary | ICD-10-CM | POA: Insufficient documentation

## 2021-08-29 DIAGNOSIS — Z882 Allergy status to sulfonamides status: Secondary | ICD-10-CM | POA: Insufficient documentation

## 2021-08-29 DIAGNOSIS — C50811 Malignant neoplasm of overlapping sites of right female breast: Secondary | ICD-10-CM | POA: Insufficient documentation

## 2021-08-29 DIAGNOSIS — Z8249 Family history of ischemic heart disease and other diseases of the circulatory system: Secondary | ICD-10-CM | POA: Insufficient documentation

## 2021-08-29 DIAGNOSIS — Z79811 Long term (current) use of aromatase inhibitors: Secondary | ICD-10-CM | POA: Diagnosis not present

## 2021-08-29 DIAGNOSIS — Z8261 Family history of arthritis: Secondary | ICD-10-CM | POA: Diagnosis not present

## 2021-08-29 DIAGNOSIS — C50111 Malignant neoplasm of central portion of right female breast: Secondary | ICD-10-CM | POA: Diagnosis not present

## 2021-08-29 DIAGNOSIS — Z880 Allergy status to penicillin: Secondary | ICD-10-CM | POA: Insufficient documentation

## 2021-08-29 DIAGNOSIS — Z923 Personal history of irradiation: Secondary | ICD-10-CM | POA: Diagnosis not present

## 2021-08-29 DIAGNOSIS — Z87891 Personal history of nicotine dependence: Secondary | ICD-10-CM | POA: Diagnosis not present

## 2021-08-29 DIAGNOSIS — Z833 Family history of diabetes mellitus: Secondary | ICD-10-CM | POA: Diagnosis not present

## 2021-08-29 LAB — CBC WITH DIFFERENTIAL (CANCER CENTER ONLY)
Abs Immature Granulocytes: 0.01 10*3/uL (ref 0.00–0.07)
Basophils Absolute: 0 10*3/uL (ref 0.0–0.1)
Basophils Relative: 1 %
Eosinophils Absolute: 0.1 10*3/uL (ref 0.0–0.5)
Eosinophils Relative: 1 %
HCT: 39.6 % (ref 36.0–46.0)
Hemoglobin: 13.2 g/dL (ref 12.0–15.0)
Immature Granulocytes: 0 %
Lymphocytes Relative: 33 %
Lymphs Abs: 1.9 10*3/uL (ref 0.7–4.0)
MCH: 28.6 pg (ref 26.0–34.0)
MCHC: 33.3 g/dL (ref 30.0–36.0)
MCV: 85.7 fL (ref 80.0–100.0)
Monocytes Absolute: 0.6 10*3/uL (ref 0.1–1.0)
Monocytes Relative: 10 %
Neutro Abs: 3.2 10*3/uL (ref 1.7–7.7)
Neutrophils Relative %: 55 %
Platelet Count: 259 10*3/uL (ref 150–400)
RBC: 4.62 MIL/uL (ref 3.87–5.11)
RDW: 13.2 % (ref 11.5–15.5)
WBC Count: 5.8 10*3/uL (ref 4.0–10.5)
nRBC: 0 % (ref 0.0–0.2)

## 2021-08-29 LAB — CMP (CANCER CENTER ONLY)
ALT: 21 U/L (ref 0–44)
AST: 24 U/L (ref 15–41)
Albumin: 4.1 g/dL (ref 3.5–5.0)
Alkaline Phosphatase: 67 U/L (ref 38–126)
Anion gap: 7 (ref 5–15)
BUN: 20 mg/dL (ref 8–23)
CO2: 25 mmol/L (ref 22–32)
Calcium: 9.2 mg/dL (ref 8.9–10.3)
Chloride: 105 mmol/L (ref 98–111)
Creatinine: 0.74 mg/dL (ref 0.44–1.00)
GFR, Estimated: >60 mL/min (ref >60)
Glucose, Bld: 95 mg/dL (ref 70–99)
Potassium: 4.6 mmol/L (ref 3.5–5.1)
Sodium: 137 mmol/L (ref 135–145)
Total Bilirubin: 0.5 mg/dL (ref 0.3–1.2)
Total Protein: 7 g/dL (ref 6.5–8.1)

## 2021-08-29 MED ORDER — ANASTROZOLE 1 MG PO TABS: 1.0000 mg | ORAL_TABLET | Freq: Every day | ORAL | 1 refills | Status: DC

## 2021-10-11 ENCOUNTER — Other Ambulatory Visit: Payer: Self-pay | Admitting: *Deleted

## 2022-01-12 ENCOUNTER — Other Ambulatory Visit: Payer: Self-pay | Admitting: Allergy and Immunology

## 2022-01-24 ENCOUNTER — Ambulatory Visit (INDEPENDENT_AMBULATORY_CARE_PROVIDER_SITE_OTHER): Payer: Medicare Other | Admitting: Orthopaedic Surgery

## 2022-01-24 ENCOUNTER — Encounter: Payer: Self-pay | Admitting: Orthopaedic Surgery

## 2022-01-24 DIAGNOSIS — L84 Corns and callosities: Secondary | ICD-10-CM | POA: Diagnosis not present

## 2022-01-24 NOTE — Progress Notes (Signed)
? ?Office Visit Note ?  ?Patient: Cindy Arias           ?Date of Birth: 12-Feb-1946           ?MRN: [id number] ?Visit Date: 01/24/2022 ?             ?Requested by: Cari Caraway, MD ?[address] ?[place],  [place] [address] ?PCP: Cari Caraway, MD ? ? ?Assessment & Plan: ?Visit Diagnoses:  ?1. Callus between toes   ? ? ?Plan: [name] has developed a large callus between the second and third toes of her left foot.  This significantly impacts her ability to comfortably walk.  I shaved the large callus on the medial side of the third toe which relieved her symptoms.  She is planning a trip to [place] with her family in a couple weeks I think she will feel much more comfortable.  She will wear a gel pad or Band-Aid around the toe just to prevent her from rubbing and reforming the callus. ? ?Follow-Up Instructions: Return if symptoms worsen or fail to improve.  ? ?Orders:  ?No orders of the defined types were placed in this encounter. ? ?No orders of the defined types were placed in this encounter. ? ? ? ? Procedures: ?No procedures performed ? ? ?Clinical Data: ?No additional findings. ? ? ?Subjective: ?Chief Complaint  ?Patient presents with  ? Left Foot - Pain  ?Patient presents today for her left third toe. She said that she has an area on the medial side of her toe that comes and goes. She said that it now rubs on the second toe and makes it hurt as well. She has pain to walk. She has an upcoming trip to [facility] and wants to see if there is something that can alleviate the pain.  ? ?HPI ? ?Review of Systems ? ? ?Objective: ?Vital Signs: LMP 02/13/2012  ? ?Physical Exam ?Constitutional:   ?   Appearance: She is well-developed.  ?Eyes:  ?   Pupils: Pupils are equal, round, and reactive to light.  ?Pulmonary:  ?   Effort: Pulmonary effort is normal.  ?Skin: ?   General: Skin is warm and dry.  ?Neurological:  ?   Mental Status: She is alert and oriented to person, place, and time.  ?Psychiatric:     ?    Behavior: Behavior normal.  ? ? ?Ortho Exam left foot with prior surgery on the second and great toes.  She is does have hallux valgus and the toe does abut the second toe but is not causing any problem.  Has had a dorsal incision on the second toe for hammer toe correction and not seems fine but there is been a large callus on the third toe abutting the second toe with a 2 rub about the level of the PIP joint.  Neurologically intact. ? ?Specialty Comments:  ?No specialty comments available. ? ?Imaging: ?No results found. ? ? ?PMFS History: ?Patient Active Problem List  ? Diagnosis Date Noted  ? Callus between toes 01/24/2022  ? Hammertoe of second toe of right foot 01/11/2021  ? Pain in right foot 01/05/2020  ? Primary osteoarthritis of left knee 11/04/2018  ? History of left knee replacement 11/04/2018  ? Bilateral primary osteoarthritis of knee 02/21/2018  ? Carcinoma of central portion of right breast in female, estrogen receptor positive ([place]) 12/21/2016  ? Malignant neoplasm of overlapping sites of right breast in female, estrogen receptor positive ([place]) 11/19/2016  ? ?Past  Medical History:  ?Diagnosis Date  ? Arthritis   ? "bone on bone'  left knee  ? Asthma   ? LAST FLARE UP 3 YRS AGO  ? Breast cancer ([place])   ? RIGHT BREAST  ? Colitis   ? Dysphagia   ? Dyspnea   ? NONE IN 3 YRS  ? Esophageal stricture   ? GERD (gastroesophageal reflux disease)   ? History of infertility   ? History of radiation therapy 12/31/16- 01/21/17  ? Right Breast 42.56 Gy in 16 fractions  ? Osteopenia   ? Pneumonia   ? Rosacea   ? Ulcerative colitis ([place])   ?  ?Family History  ?Problem Relation Age of Onset  ? Cancer Mother   ?     Liver- 2003 Lymphoma  ? Rheum arthritis Mother   ? Stroke Father   ? Diabetes Maternal Grandfather   ?     Type 2   ? Heart disease Maternal Grandfather   ? Hypertension Maternal Grandmother   ? Heart disease Maternal Grandmother   ? Osteoporosis Maternal Grandmother   ? Anesthesia problems Neg Hx   ?  ?Past  Surgical History:  ?Procedure Laterality Date  ? BREAST LUMPECTOMY WITH RADIOACTIVE SEED AND SENTINEL LYMPH NODE BIOPSY Right 11/21/2016  ? Procedure: BREAST LUMPECTOMY WITH RADIOACTIVE SEED AND SENTINEL LYMPH NODE BIOPSY;  Surgeon: Excell Seltzer, MD;  Location: [place];  Service: General;  Laterality: Right;  ? BREAST SURGERY    ? RIGHT BREAST LUMPECTOMY  ? BUNIONECTOMY WITH HAMMERTOE RECONSTRUCTION Left 07/2014  ? CATARACT EXTRACTION Right 10/2015  ? ESOPHAGEAL DILATION    ? EYE SURGERY    ? OD  ? JOINT REPLACEMENT    ? Repair of Chiari Malformation  02/05/13  ? SUBOCCIPITAL CRANIECTOMY CERVICAL LAMINECTOMY  02/13/2012  ? Procedure: SUBOCCIPITAL CRANIECTOMY CERVICAL LAMINECTOMY/DURAPLASTY;  Surgeon: Ophelia Charter, MD;  Location: [place] [facility];  Service: Neurosurgery;  Laterality: N/A;  Suboccipital craniectomy with upper cervical laminectomy and duraplasty  ? TOTAL KNEE ARTHROPLASTY Left 11/04/2018  ? TOTAL KNEE ARTHROPLASTY Left 11/04/2018  ? Procedure: LEFT TOTAL KNEE ARTHROPLASTY;  Surgeon: Garald Balding, MD;  Location: [place];  Service: Orthopedics;  Laterality: Left;  ? TUBAL LIGATION  1987  ? ?Social History  ? ?Occupational History  ? Not on file  ?Tobacco Use  ? Smoking status: Never  ? Smokeless tobacco: Never  ? Tobacco comments:  ?  she smoked for a few months in college  ?Vaping Use  ? Vaping Use: Never used  ?Substance and Sexual Activity  ? Alcohol use: Yes  ?  Comment: 1 glass of wine monthly  ? Drug use: No  ?  Comment: Smoked in college ~ 2 years  ? Sexual activity: Not on file  ? ? ? ? ? ? ?

## 2022-03-20 ENCOUNTER — Telehealth: Payer: Self-pay | Admitting: Orthopaedic Surgery

## 2022-03-20 NOTE — Telephone Encounter (Signed)
Called patient. She will pick up placard application tomorrow morning.

## 2022-03-20 NOTE — Telephone Encounter (Signed)
Pt called. Pt states she will be dropping off her handicap placard today for Dr. Durward Fortes to sign. Please call pt when signed and ready for pick up. Pt phone number is [phone].

## 2022-04-23 ENCOUNTER — Ambulatory Visit: Payer: Medicare Other | Attending: Internal Medicine

## 2022-04-23 DIAGNOSIS — Z23 Encounter for immunization: Secondary | ICD-10-CM

## 2022-04-24 ENCOUNTER — Other Ambulatory Visit (HOSPITAL_BASED_OUTPATIENT_CLINIC_OR_DEPARTMENT_OTHER): Payer: Self-pay

## 2022-04-24 MED ORDER — PFIZER COVID-19 VAC BIVALENT 30 MCG/0.3ML IM SUSP
INTRAMUSCULAR | 0 refills | Status: DC
  Filled 2022-04-24: qty 0.3, 1d supply, fill #0

## 2022-08-15 ENCOUNTER — Other Ambulatory Visit (HOSPITAL_BASED_OUTPATIENT_CLINIC_OR_DEPARTMENT_OTHER): Payer: Self-pay

## 2022-08-15 MED ORDER — INFLUENZA VAC A&B SA ADJ QUAD 0.5 ML IM PRSY
PREFILLED_SYRINGE | INTRAMUSCULAR | 0 refills | Status: DC
  Filled 2022-08-15: qty 0.5, 1d supply, fill #0

## 2022-08-15 MED ORDER — COMIRNATY 30 MCG/0.3ML IM SUSY
PREFILLED_SYRINGE | INTRAMUSCULAR | 0 refills | Status: DC
  Filled 2022-08-15: qty 0.3, 1d supply, fill #0

## 2022-08-28 ENCOUNTER — Encounter: Payer: Self-pay | Admitting: Allergy and Immunology

## 2022-08-28 ENCOUNTER — Ambulatory Visit (INDEPENDENT_AMBULATORY_CARE_PROVIDER_SITE_OTHER): Payer: Medicare Other | Admitting: Allergy and Immunology

## 2022-08-28 VITALS — BP 120/68 | HR 64 | Temp 97.9°F | Resp 18 | Ht 60.0 in | Wt 111.0 lb

## 2022-08-28 DIAGNOSIS — J4540 Moderate persistent asthma, uncomplicated: Secondary | ICD-10-CM

## 2022-08-28 DIAGNOSIS — J3089 Other allergic rhinitis: Secondary | ICD-10-CM

## 2022-08-28 DIAGNOSIS — K219 Gastro-esophageal reflux disease without esophagitis: Secondary | ICD-10-CM

## 2022-08-28 MED ORDER — BUDESONIDE-FORMOTEROL FUMARATE 160-4.5 MCG/ACT IN AERO: 2.0000 | INHALATION_SPRAY | Freq: Two times a day (BID) | RESPIRATORY_TRACT | 5 refills | Status: DC

## 2022-08-28 MED ORDER — OMEPRAZOLE 40 MG PO CPDR: DELAYED_RELEASE_CAPSULE | ORAL | 3 refills | Status: DC

## 2022-08-28 NOTE — Patient Instructions (Signed)
  1. Treat inflammation:   A. Symbicort 160-2 inhalations 1-2 times a day  2. Continue treatment for reflux:   A. Omeprazole 40 mg 1-2 times a day  3. Continue the following if needed:   A. antihistamine   B. Nasal saline  C. Mucinex DM  D. Xopenx HFA  D. Famotidine 40 mg   4. Return to clinic in 1 year or earlier if problem   5.  Obtain RSV vaccine

## 2022-08-28 NOTE — Progress Notes (Unsigned)
[facility]   Follow-up Note  Referring Provider: Cari Caraway, MD Primary Provider: Cari Caraway, MD Date of Office Visit: 08/28/2022  Subjective:   Cindy Arias (DOB: 30-Jun-1946) is a 76 y.o. female who returns to the [facility] on 08/28/2022 in re-evaluation of the following:  HPI: [name] returns to this clinic in reevaluation of asthma, allergic rhinitis, LPR.  Her last visit to this clinic was 18 July 2021.  She has had absolutely no issues with asthma and can exercise without difficulty and rarely uses the short acting bronchodilator while she continues on Symbicort mostly 1 time per day.  She had no problems with her nose and has not required an antibiotic to treat an episode of sinusitis and does not use any nasal steroids at this point in time but does rely on the use of antihistamines.  Her reflux is under very good control while using omeprazole mostly just 1 time per day.  She has obtained a flu vaccine and a COVID-vaccine.  Allergies as of 08/28/2022       Reactions   Penicillins Hives, Other (See Comments)   UNSPECIFIED CHILDHOOD REACTION  Had reaction as a child and as an adult. Had additional reaction but not clear on the specifics   Sulfa Antibiotics Hives   Albuterol Other (See Comments)   UNSPECIFIED REACTION  States she cannot tolerate albuterol without side effects        Medication List     budesonide-formoterol 160-4.5 MCG/ACT inhaler Commonly known as: Symbicort Inhale 2 puffs into the lungs in the morning and at bedtime.   CALCIUM CITRATE-VITAMIN D PO Take 1 tablet by mouth daily.   famotidine 40 MG tablet Commonly known as: PEPCID Take 1 tablet (40 mg total) by mouth daily.   fluticasone 50 MCG/ACT nasal spray Commonly known as: FLONASE Place 2 sprays into both nostrils daily as needed for allergies or rhinitis.   levalbuterol 45 MCG/ACT inhaler Commonly  known as: XOPENEX HFA Inhale two puffs every 4-6 hours if needed for cough or wheeze   mesalamine 1.2 g EC tablet Commonly known as: LIALDA Take 1.2 g by mouth daily with breakfast.   multivitamins with iron Tabs tablet Take 1 tablet by mouth daily.   omeprazole 40 MG capsule Commonly known as: PRILOSEC TAKE ONE CAPSULE TWICE DAILY AS DIRECTED   Reclast 5 MG/100ML Soln injection Generic drug: zoledronic acid   sodium chloride 0.65 % nasal spray Commonly known as: OCEAN Place 1 spray into the nose daily as needed for congestion.   tretinoin 0.025 % cream Commonly known as: RETIN-A Apply 1 Application topically at bedtime.   triamcinolone cream 0.1 % Commonly known as: KENALOG Apply 1 Application topically 4 (four) times daily.   vitamin E 180 MG (400 UNITS) capsule Take 400 Units by mouth daily.        Past Medical History:  Diagnosis Date   Arthritis    "bone on bone'  left knee   Asthma    LAST FLARE UP 3 YRS AGO   Breast cancer ([facility])    RIGHT BREAST   Colitis    Dysphagia    Dyspnea    NONE IN 3 YRS   Esophageal stricture    GERD (gastroesophageal reflux disease)    History of infertility    History of radiation therapy 12/31/16- 01/21/17   Right Breast 42.56 Gy in 16 fractions   Osteopenia  Pneumonia    Rosacea    Ulcerative colitis ([place])     Past Surgical History:  Procedure Laterality Date   BREAST LUMPECTOMY WITH RADIOACTIVE SEED AND SENTINEL LYMPH NODE BIOPSY Right 11/21/2016   Procedure: BREAST LUMPECTOMY WITH RADIOACTIVE SEED AND SENTINEL LYMPH NODE BIOPSY;  Surgeon: Excell Seltzer, MD;  Location: [facility];  Service: General;  Laterality: Right;   BREAST SURGERY     RIGHT BREAST LUMPECTOMY   BUNIONECTOMY WITH HAMMERTOE RECONSTRUCTION Left 07/2014   CATARACT EXTRACTION Right 10/2015   ESOPHAGEAL DILATION     EYE SURGERY     OD   JOINT REPLACEMENT     Repair of Chiari Malformation  02/05/13   SUBOCCIPITAL CRANIECTOMY  CERVICAL LAMINECTOMY  02/13/2012   Procedure: SUBOCCIPITAL CRANIECTOMY CERVICAL LAMINECTOMY/DURAPLASTY;  Surgeon: Ophelia Charter, MD;  Location: [facility];  Service: Neurosurgery;  Laterality: N/A;  Suboccipital craniectomy with upper cervical laminectomy and duraplasty   TOTAL KNEE ARTHROPLASTY Left 11/04/2018   TOTAL KNEE ARTHROPLASTY Left 11/04/2018   Procedure: LEFT TOTAL KNEE ARTHROPLASTY;  Surgeon: Garald Balding, MD;  Location: [facility];  Service: Orthopedics;  Laterality: Left;   TUBAL LIGATION  1987    Review of systems negative except as noted in HPI / PMHx or noted below:  Review of Systems  Constitutional: Negative.   HENT: Negative.    Eyes: Negative.   Respiratory: Negative.    Cardiovascular: Negative.   Gastrointestinal: Negative.   Genitourinary: Negative.   Musculoskeletal: Negative.   Skin: Negative.   Neurological: Negative.   Endo/Heme/Allergies: Negative.   Psychiatric/Behavioral: Negative.       Objective:   Vitals:   08/28/22 1123  BP: 120/68  Pulse: 64  Resp: 18  Temp: 97.9 F (36.6 C)  SpO2: 98%   Height: 5' (152.4 cm)  Weight: 111 lb (50.3 kg)   Physical Exam Constitutional:      Appearance: She is not diaphoretic.  HENT:     Head: Normocephalic.     Right Ear: Tympanic membrane, ear canal and external ear normal.     Left Ear: Tympanic membrane, ear canal and external ear normal.     Nose: Nose normal. No mucosal edema or rhinorrhea.     Mouth/Throat:     Pharynx: Uvula midline. No oropharyngeal exudate.  Eyes:     Conjunctiva/sclera: Conjunctivae normal.  Neck:     Thyroid: No thyromegaly.     Trachea: Trachea normal. No tracheal tenderness or tracheal deviation.  Cardiovascular:     Rate and Rhythm: Normal rate and regular rhythm.     Heart sounds: Normal heart sounds, S1 normal and S2 normal. No murmur heard. Pulmonary:     Effort: No respiratory distress.     Breath sounds: Normal breath sounds. No stridor. No wheezing or  rales.  Lymphadenopathy:     Head:     Right side of head: No tonsillar adenopathy.     Left side of head: No tonsillar adenopathy.     Cervical: No cervical adenopathy.  Skin:    Findings: No erythema or rash.     Nails: There is no clubbing.  Neurological:     Mental Status: She is alert.     Diagnostics:    Spirometry was performed and demonstrated an FEV1 of 1.73 at 98 % of predicted.  Assessment and Plan:   1. Asthma, moderate persistent, well-controlled   2. Other allergic rhinitis   3. LPRD (laryngopharyngeal reflux disease)  1. Treat inflammation:   A. Symbicort 160-2 inhalations 1-2 times a day  2. Continue treatment for reflux:   A. Omeprazole 40 mg 1-2 times a day  3. Continue the following if needed:   A. antihistamine   B. Nasal saline  C. Mucinex DM  D. Xopenx HFA  D. Famotidine 40 mg   4. Return to clinic in 1 year or earlier if problem   5.  Obtain RSV vaccine  Jodi is really doing very well and she has a very good understanding about her disease state and appropriate dosing of her medications depending on disease activity and she will continue on anti-inflammatory agents for airway and therapy directed against reflux as noted above and I will see her back in this clinic in 1 year or earlier if there is a problem.  Allena Katz, MD Allergy / Immunology [facility]

## 2022-08-29 ENCOUNTER — Encounter: Payer: Self-pay | Admitting: Allergy and Immunology

## 2022-09-10 ENCOUNTER — Other Ambulatory Visit (HOSPITAL_BASED_OUTPATIENT_CLINIC_OR_DEPARTMENT_OTHER): Payer: Self-pay

## 2022-09-10 MED ORDER — AREXVY 120 MCG/0.5ML IM SUSR
INTRAMUSCULAR | 0 refills | Status: DC
  Filled 2022-09-10: qty 0.5, 1d supply, fill #0

## 2022-09-18 ENCOUNTER — Encounter: Payer: Self-pay | Admitting: Orthopaedic Surgery

## 2022-09-18 ENCOUNTER — Ambulatory Visit (INDEPENDENT_AMBULATORY_CARE_PROVIDER_SITE_OTHER): Payer: Medicare Other | Admitting: Orthopaedic Surgery

## 2022-09-18 DIAGNOSIS — L84 Corns and callosities: Secondary | ICD-10-CM | POA: Diagnosis not present

## 2022-09-18 NOTE — Progress Notes (Signed)
Office Visit Note   Patient: Cindy Arias           Date of Birth: 1946-08-18           MRN: [id number] Visit Date: 09/18/2022              Requested by: Cari Caraway, [place],  [place] [address] PCP: Cari Caraway, MD   Assessment & Plan: Visit Diagnoses:  1. Callus between toes     Plan: Mrs. Weins has redeveloped a callus on the medial aspect of her left third toe.  This interferes with her ability to mobilize and wear comfortable shoes.  I pared this down to the point where she was quite comfortable.  She does have significant pronation at foot with a bunion.  She has had prior bunion surgery and even hammertoe procedure.  The second third toes are well aligned but they are rubbing together with weightbearing.  Have discussed surgical procedures for the foot but she is more interested in just wearing toe spacers, wearing comfortable shoes and having the callus pared the every so often.  Follow-Up Instructions: Return if symptoms worsen or fail to improve.   Orders:  No orders of the defined types were placed in this encounter.  No orders of the defined types were placed in this encounter.     Procedures: No procedures performed   Clinical Data: No additional findings.   Subjective: Chief Complaint  Patient presents with   Left 3rd Toe - Blister  Recurrent callus between the second and third toes left foot that causes considerable discomfort  HPI  Review of Systems   Objective: Vital Signs: LMP 02/13/2012   Physical Exam Constitutional:      Appearance: She is well-developed.  Eyes:     Pupils: Pupils are equal, round, and reactive to light.  Pulmonary:     Effort: Pulmonary effort is normal.  Skin:    General: Skin is warm and dry.  Neurological:     Mental Status: She is alert and oriented to person, place, and time.  Psychiatric:        Behavior: Behavior normal.     Ortho Exam awake alert and oriented x3.   Comfortable sitting.  Examination of the left foot reveals significant pronation with weightbearing.  There is a small bunion.  There is been prior bunion surgery but there is still hallux valgus that is easily correctable to neutral.  There is a large callus on the medial aspect of the third toe at the level of the IP joint.  Neurologically intact.  Both second and third toes are nicely aligned without hammering but tightly juxtaposed.  Good capillary refill  Specialty Comments:  No specialty comments available.  Imaging: No results found.   PMFS History: Patient Active Problem List   Diagnosis Date Noted   Callus between toes 01/24/2022   Hammertoe of second toe of right foot 01/11/2021   Pain in right foot 01/05/2020   Primary osteoarthritis of left knee 11/04/2018   History of left knee replacement 11/04/2018   Bilateral primary osteoarthritis of knee 02/21/2018   Carcinoma of central portion of right breast in female, estrogen receptor positive ([facility]) 12/21/2016   Malignant neoplasm of overlapping sites of right breast in female, estrogen receptor positive ([facility]) 11/19/2016   Past Medical History:  Diagnosis Date   Arthritis    "bone on bone'  left knee   Asthma    LAST FLARE UP 3  YRS AGO   Breast cancer ([facility])    RIGHT BREAST   Colitis    Dysphagia    Dyspnea    NONE IN 3 YRS   Esophageal stricture    GERD (gastroesophageal reflux disease)    History of infertility    History of radiation therapy 12/31/16- 01/21/17   Right Breast 42.56 Gy in 16 fractions   Osteopenia    Pneumonia    Rosacea    Ulcerative colitis ([facility])     Family History  Problem Relation Age of Onset   Cancer Mother        Liver- 2003 Lymphoma   Rheum arthritis Mother    Stroke Father    Diabetes Maternal Grandfather        Type 2    Heart disease Maternal Grandfather    Hypertension Maternal Grandmother    Heart disease Maternal Grandmother    Osteoporosis Maternal Grandmother    Anesthesia  problems Neg Hx     Past Surgical History:  Procedure Laterality Date   BREAST LUMPECTOMY WITH RADIOACTIVE SEED AND SENTINEL LYMPH NODE BIOPSY Right 11/21/2016   Procedure: BREAST LUMPECTOMY WITH RADIOACTIVE SEED AND SENTINEL LYMPH NODE BIOPSY;  Surgeon: Excell Seltzer, MD;  Location: [facility];  Service: General;  Laterality: Right;   BREAST SURGERY     RIGHT BREAST LUMPECTOMY   BUNIONECTOMY WITH HAMMERTOE RECONSTRUCTION Left 07/2014   CATARACT EXTRACTION Right 10/2015   ESOPHAGEAL DILATION     EYE SURGERY     OD   JOINT REPLACEMENT     Repair of Chiari Malformation  02/05/13   SUBOCCIPITAL CRANIECTOMY CERVICAL LAMINECTOMY  02/13/2012   Procedure: SUBOCCIPITAL CRANIECTOMY CERVICAL LAMINECTOMY/DURAPLASTY;  Surgeon: Ophelia Charter, MD;  [facility];  Service: Neurosurgery;  Laterality: N/A;  Suboccipital craniectomy with upper cervical laminectomy and duraplasty   TOTAL KNEE ARTHROPLASTY Left 11/04/2018   TOTAL KNEE ARTHROPLASTY Left 11/04/2018   Procedure: LEFT TOTAL KNEE ARTHROPLASTY;  Surgeon: Garald Balding, MD;  Location: [facility];  Service: Orthopedics;  Laterality: Left;   TUBAL LIGATION  1987   Social History   Occupational History   Not on file  Tobacco Use   Smoking status: Never   Smokeless tobacco: Never   Tobacco comments:    she smoked for a few months in college  Vaping Use   Vaping Use: Never used  Substance and Sexual Activity   Alcohol use: Yes    Comment: 1 glass of wine monthly   Drug use: No    Comment: Smoked in college ~ 2 years   Sexual activity: Not on file     Garald Balding, MD   Note - This record has been created using [employer].  Chart creation errors have been sought, but may not always  have been located. Such creation errors do not reflect on  the standard of medical care.

## 2023-02-13 ENCOUNTER — Other Ambulatory Visit (HOSPITAL_BASED_OUTPATIENT_CLINIC_OR_DEPARTMENT_OTHER): Payer: Self-pay

## 2023-02-19 ENCOUNTER — Other Ambulatory Visit (HOSPITAL_BASED_OUTPATIENT_CLINIC_OR_DEPARTMENT_OTHER): Payer: Self-pay

## 2023-02-19 MED ORDER — PNEUMOCOCCAL 20-VAL CONJ VACC 0.5 ML IM SUSY
0.5000 mL | PREFILLED_SYRINGE | Freq: Once | INTRAMUSCULAR | 0 refills | Status: AC
  Filled 2023-02-19: qty 0.5, 1d supply, fill #0

## 2023-04-17 ENCOUNTER — Encounter: Payer: Self-pay | Admitting: Physician Assistant

## 2023-04-17 ENCOUNTER — Other Ambulatory Visit (INDEPENDENT_AMBULATORY_CARE_PROVIDER_SITE_OTHER): Payer: Medicare Other

## 2023-04-17 ENCOUNTER — Ambulatory Visit (INDEPENDENT_AMBULATORY_CARE_PROVIDER_SITE_OTHER): Payer: Medicare Other | Admitting: Physician Assistant

## 2023-04-17 DIAGNOSIS — M25511 Pain in right shoulder: Secondary | ICD-10-CM | POA: Diagnosis not present

## 2023-04-17 MED ORDER — BUPIVACAINE HCL 0.25 % IJ SOLN
2.0000 mL | INTRAMUSCULAR | Status: AC | PRN
  Administered 2023-04-17: 2 mL via INTRA_ARTICULAR

## 2023-04-17 MED ORDER — LIDOCAINE HCL 1 % IJ SOLN
2.0000 mL | INTRAMUSCULAR | Status: AC | PRN
  Administered 2023-04-17: 2 mL

## 2023-04-17 MED ORDER — METHYLPREDNISOLONE ACETATE 40 MG/ML IJ SUSP
80.0000 mg | INTRAMUSCULAR | Status: AC | PRN
  Administered 2023-04-17: 80 mg via INTRA_ARTICULAR

## 2023-04-17 NOTE — Progress Notes (Signed)
Office Visit Note   Patient: Cindy Arias           Date of Birth: 1946/03/14           MRN: [id number] Visit Date: 04/17/2023              Requested by: Gweneth Dimitri, MD [address] [place],  [place] [address] PCP: Gweneth Dimitri, MD   Assessment & Plan: Visit Diagnoses:  1. Right shoulder pain, unspecified chronicity     Plan: Most of her pain today on exam is subacromial rotator cuff tendinitis.  She does have a little arthritis in her glenohumeral joint but her external rotation is good and painless.  I will go forward with a subacromial injection today.  Per Dr. Hoy Register request he would like for her to follow-up with Dr. August Saucer so we will make an appointment for him to follow-up with her on her right shoulder in a few weeks.  She also asked why was she was here to have a painful callus trimmed that she has trimmed periodically by Dr. Cleophas Dunker between her third and fourth toes.  After cleaning the area with alcohol callus was debrided had no signs of infection  Follow-Up Instructions: With Dr. August Saucer  Orders:  Orders Placed This Encounter  Procedures   XR Shoulder Right   No orders of the defined types were placed in this encounter.     Procedures: Large Joint Inj: R subacromial bursa on 04/17/2023 9:30 AM Indications: diagnostic evaluation and pain Details: 25 G 1.5 in needle, posterior approach  Arthrogram: No  Medications: 2 mL lidocaine 1 %; 80 mg methylPREDNISolone acetate 40 MG/ML; 2 mL bupivacaine 0.25 % Outcome: tolerated well, no immediate complications Procedure, treatment alternatives, risks and benefits explained, specific risks discussed. Consent was given by the patient.       Clinical Data: No additional findings.   Subjective: No chief complaint on file.   HPI patient with a 3-week history of right shoulder pain no particular injury has not had this in the past has difficulty combing her hair and doing her make-up has tried  Tylenol and ibuprofen which upsets her stomach  Review of Systems  All other systems reviewed and are negative.    Objective: Vital Signs: LMP 02/13/2012   Physical Exam Constitutional:      Appearance: Normal appearance.  Pulmonary:     Effort: Pulmonary effort is normal.  Skin:    General: Skin is warm and dry.  Neurological:     General: No focal deficit present.     Mental Status: She is alert.     Ortho Exam Pain is focused over the superior shoulder over the [facility] joint.  She is able to elevate her arm but it about 130 degrees she uses her other arm for assistance as it is painful.  Negative drop arm test positive empty can test she has good strength with resisted external and internal rotation and abduction.  Internal rotation behind her back is fairly fluid.  The external rotation is fairly fluid does not reproduce any painful symptoms no radicular findings she is neurovascular intact distally Specialty Comments:  No specialty comments available.  Imaging: No results found.   PMFS History: Patient Active Problem List   Diagnosis Date Noted   Pain in right shoulder 04/17/2023   Callus between toes 01/24/2022   Hammertoe of second toe of right foot 01/11/2021   Pain in right foot 01/05/2020   Primary osteoarthritis of left  knee 11/04/2018   History of left knee replacement 11/04/2018   Bilateral primary osteoarthritis of knee 02/21/2018   Carcinoma of central portion of right breast in female, estrogen receptor positive ([facility]) 12/21/2016   Malignant neoplasm of overlapping sites of right breast in female, estrogen receptor positive ([facility]) 11/19/2016   Past Medical History:  Diagnosis Date   Arthritis    "bone on bone'  left knee   Asthma    LAST FLARE UP 3 YRS AGO   Breast cancer ([facility])    RIGHT BREAST   Colitis    Dysphagia    Dyspnea    NONE IN 3 YRS   Esophageal stricture    GERD (gastroesophageal reflux disease)    History of infertility    History of  radiation therapy 12/31/16- 01/21/17   Right Breast 42.56 Gy in 16 fractions   Osteopenia    Pneumonia    Rosacea    Ulcerative colitis ([facility])     Family History  Problem Relation Age of Onset   Cancer Mother        Liver- 2003 Lymphoma   Rheum arthritis Mother    Stroke Father    Diabetes Maternal Grandfather        Type 2    Heart disease Maternal Grandfather    Hypertension Maternal Grandmother    Heart disease Maternal Grandmother    Osteoporosis Maternal Grandmother    Anesthesia problems Neg Hx     Past Surgical History:  Procedure Laterality Date   BREAST LUMPECTOMY WITH RADIOACTIVE SEED AND SENTINEL LYMPH NODE BIOPSY Right 11/21/2016   Procedure: BREAST LUMPECTOMY WITH RADIOACTIVE SEED AND SENTINEL LYMPH NODE BIOPSY;  Surgeon: Glenna Fellows, MD;  [facility];  Service: General;  Laterality: Right;   BREAST SURGERY     RIGHT BREAST LUMPECTOMY   BUNIONECTOMY WITH HAMMERTOE RECONSTRUCTION Left 07/2014   CATARACT EXTRACTION Right 10/2015   ESOPHAGEAL DILATION     EYE SURGERY     OD   JOINT REPLACEMENT     Repair of Chiari Malformation  02/05/13   SUBOCCIPITAL CRANIECTOMY CERVICAL LAMINECTOMY  02/13/2012   Procedure: SUBOCCIPITAL CRANIECTOMY CERVICAL LAMINECTOMY/DURAPLASTY;  Surgeon: Cristi Loron, MD;  [facility];  Service: Neurosurgery;  Laterality: N/A;  Suboccipital craniectomy with upper cervical laminectomy and duraplasty   TOTAL KNEE ARTHROPLASTY Left 11/04/2018   TOTAL KNEE ARTHROPLASTY Left 11/04/2018   Procedure: LEFT TOTAL KNEE ARTHROPLASTY;  Surgeon: Valeria Batman, MD;  [facility];  Service: Orthopedics;  Laterality: Left;   TUBAL LIGATION  1987   Social History   Occupational History   Not on file  Tobacco Use   Smoking status: Never   Smokeless tobacco: Never   Tobacco comments:    she smoked for a few months in college  Vaping Use   Vaping Use: Never used  Substance and Sexual Activity   Alcohol use:  Yes    Comment: 1 glass of wine monthly   Drug use: No    Comment: Smoked in college ~ 2 years   Sexual activity: Not on file

## 2023-05-01 ENCOUNTER — Ambulatory Visit (INDEPENDENT_AMBULATORY_CARE_PROVIDER_SITE_OTHER): Payer: Medicare Other | Admitting: Orthopedic Surgery

## 2023-05-01 DIAGNOSIS — M25511 Pain in right shoulder: Secondary | ICD-10-CM | POA: Diagnosis not present

## 2023-05-02 ENCOUNTER — Encounter: Payer: Self-pay | Admitting: Orthopedic Surgery

## 2023-05-02 NOTE — Progress Notes (Signed)
Office Visit Note   Patient: Cindy Arias           Date of Birth: 1946/04/30           MRN: [id number] Visit Date: 05/01/2023 Requested by: Gweneth Dimitri, MD [address] [place],  [place] [address] PCP: Gweneth Dimitri, MD  Subjective: Chief Complaint  Patient presents with   Right Shoulder - Pain    HPI: Cindy Arias is a 77 y.o. female who presents to the office reporting right shoulder pain.  She reports having pain for 6 weeks without history of injury.  She does do a lot of yoga.  Tylenol and Advil gave her GI upset so she cannot really take those medications.  The pain does very rarely radiate down to the hand.  Denies much in the way of neck pain.  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  She had a subacromial injection which gave her about 50% relief.  Now the pain is recurring.  She had a 2-week time interval with initiation of the shoulder pain where it woke her from sleep at night.  Does report some scapular pain at times..                ROS: All systems reviewed are negative as they relate to the chief complaint within the history of present illness.  Patient denies fevers or chills.  Assessment & Plan: Visit Diagnoses:  1. Right shoulder pain, unspecified chronicity     Plan: Impression is right shoulder pain with some crepitus on exam.  She has slight diminishment of her acromiohumeral interval.  No glenohumeral joint or AC joint arthritis.  I think it is likely that this is coming from some intrinsic shoulder rotator cuff/biceps/bursitis problems in the shoulder.  It is also possible it could be related to her prior neck surgery.  6 weeks of symptoms with no relief with conservative treatment including injection and home exercise program as well as medications when feasible have not given the patient relief.  MRI arthrogram of the shoulder is indicated to evaluate for rotator cuff pathology.  Follow-up after that study.   Could consider neck workup if the shoulder appears not to be the source of the pain.  Follow-Up Instructions: No follow-ups on file.   Orders:  No orders of the defined types were placed in this encounter.  No orders of the defined types were placed in this encounter.     Procedures: No procedures performed   Clinical Data: No additional findings.  Objective: Vital Signs: LMP 02/13/2012   Physical Exam:  Constitutional: Patient appears well-developed HEENT:  Head: Normocephalic Eyes:EOM are normal Neck: Normal range of motion Cardiovascular: Normal rate Pulmonary/chest: Effort normal Neurologic: Patient is alert Skin: Skin is warm Psychiatric: Patient has normal mood and affect  Ortho Exam: Ortho exam demonstrates well-healed surgical incision in the posterior neck.  Patient has 5 out of 5 grip EPL FPL interosseous wrist flexion extension bicep triceps and deltoid strength.  Right shoulder has pretty reasonable symmetric passive range of motion of 50/95/165.  Does have a little bit of popping in the shoulder with internal/external rotation 9 degrees of abduction.  Some biceps tendon tenderness in the bicipital groove.  Reasonable strength to infraspinatus supraspinatus and subscap muscle testing in both shoulders.  No discrete AC joint tenderness is present.  Specialty Comments:  No specialty comments available.  Imaging: No results found.   PMFS History: Patient Active Problem  List   Diagnosis Date Noted   Pain in right shoulder 04/17/2023   Callus between toes 01/24/2022   Hammertoe of second toe of right foot 01/11/2021   Pain in right foot 01/05/2020   Primary osteoarthritis of left knee 11/04/2018   History of left knee replacement 11/04/2018   Bilateral primary osteoarthritis of knee 02/21/2018   Carcinoma of central portion of right breast in female, estrogen receptor positive (HCC) 12/21/2016   Malignant neoplasm of overlapping sites of right breast in  female, estrogen receptor positive (HCC) 11/19/2016   Past Medical History:  Diagnosis Date   Arthritis    "bone on bone'  left knee   Asthma    LAST FLARE UP 3 YRS AGO   Breast cancer (HCC)    RIGHT BREAST   Colitis    Dysphagia    Dyspnea    NONE IN 3 YRS   Esophageal stricture    GERD (gastroesophageal reflux disease)    History of infertility    History of radiation therapy 12/31/16- 01/21/17   Right Breast 42.56 Gy in 16 fractions   Osteopenia    Pneumonia    Rosacea    Ulcerative colitis (HCC)     Family History  Problem Relation Age of Onset   Cancer Mother        Liver- 2003 Lymphoma   Rheum arthritis Mother    Stroke Father    Diabetes Maternal Grandfather        Type 2    Heart disease Maternal Grandfather    Hypertension Maternal Grandmother    Heart disease Maternal Grandmother    Osteoporosis Maternal Grandmother    Anesthesia problems Neg Hx     Past Surgical History:  Procedure Laterality Date   BREAST LUMPECTOMY WITH RADIOACTIVE SEED AND SENTINEL LYMPH NODE BIOPSY Right 11/21/2016   Procedure: BREAST LUMPECTOMY WITH RADIOACTIVE SEED AND SENTINEL LYMPH NODE BIOPSY;  Surgeon: Glenna Fellows, MD;  Location: [facility];  Service: General;  Laterality: Right;   BREAST SURGERY     RIGHT BREAST LUMPECTOMY   BUNIONECTOMY WITH HAMMERTOE RECONSTRUCTION Left 07/2014   CATARACT EXTRACTION Right 10/2015   ESOPHAGEAL DILATION     EYE SURGERY     OD   JOINT REPLACEMENT     Repair of Chiari Malformation  02/05/13   SUBOCCIPITAL CRANIECTOMY CERVICAL LAMINECTOMY  02/13/2012   Procedure: SUBOCCIPITAL CRANIECTOMY CERVICAL LAMINECTOMY/DURAPLASTY;  Surgeon: Cristi Loron, MD;  Location: [facility];  Service: Neurosurgery;  Laterality: N/A;  Suboccipital craniectomy with upper cervical laminectomy and duraplasty   TOTAL KNEE ARTHROPLASTY Left 11/04/2018   TOTAL KNEE ARTHROPLASTY Left 11/04/2018   Procedure: LEFT TOTAL KNEE ARTHROPLASTY;  Surgeon:  Valeria Batman, MD;  Location: [facility];  Service: Orthopedics;  Laterality: Left;   TUBAL LIGATION  1987   Social History   Occupational History   Not on file  Tobacco Use   Smoking status: Never   Smokeless tobacco: Never   Tobacco comments:    she smoked for a few months in college  Vaping Use   Vaping Use: Never used  Substance and Sexual Activity   Alcohol use: Yes    Comment: 1 glass of wine monthly   Drug use: No    Comment: Smoked in college ~ 2 years   Sexual activity: Not on file

## 2023-05-03 ENCOUNTER — Other Ambulatory Visit: Payer: Self-pay | Admitting: Allergy and Immunology

## 2023-05-03 ENCOUNTER — Other Ambulatory Visit: Payer: Self-pay

## 2023-05-03 DIAGNOSIS — M25511 Pain in right shoulder: Secondary | ICD-10-CM

## 2023-05-03 NOTE — Progress Notes (Signed)
Order entered in chart

## 2023-05-22 ENCOUNTER — Telehealth: Payer: Self-pay | Admitting: Orthopedic Surgery

## 2023-05-22 NOTE — Telephone Encounter (Signed)
Called patient left message to call back to schedule an appointment with Dr. August Saucer or Franky Macho for MRI review. MRI is scheduled 06/17/2023

## 2023-06-17 ENCOUNTER — Ambulatory Visit: Admission: RE | Admit: 2023-06-17 | Payer: Medicare Other | Source: Ambulatory Visit

## 2023-06-17 ENCOUNTER — Ambulatory Visit
Admission: RE | Admit: 2023-06-17 | Discharge: 2023-06-17 | Disposition: A | Discharge status: Home / Self Care | Payer: Medicare Other | Source: Ambulatory Visit | Attending: Orthopedic Surgery | Admitting: Orthopedic Surgery

## 2023-06-17 DIAGNOSIS — M25511 Pain in right shoulder: Secondary | ICD-10-CM

## 2023-06-17 MED ORDER — IOPAMIDOL (ISOVUE-M 200) INJECTION 41%
10.0000 mL | Freq: Once | INTRAMUSCULAR | Status: AC
  Administered 2023-06-17: 10 mL via INTRA_ARTICULAR

## 2023-07-03 ENCOUNTER — Encounter: Payer: Self-pay | Admitting: Orthopedic Surgery

## 2023-07-03 ENCOUNTER — Ambulatory Visit (INDEPENDENT_AMBULATORY_CARE_PROVIDER_SITE_OTHER): Payer: Medicare Other | Admitting: Orthopedic Surgery

## 2023-07-03 DIAGNOSIS — M25511 Pain in right shoulder: Secondary | ICD-10-CM

## 2023-07-03 NOTE — Progress Notes (Unsigned)
Office Visit Note   Patient: Cindy Arias           Date of Birth: [birth year]           MRN: [id number] Visit Date: 07/03/2023 Requested by: Gweneth Dimitri, MD [address] [place],  [place] [address] PCP: Gweneth Dimitri, MD  Subjective: Chief Complaint  Patient presents with   Other     Review MRI    HPI: Cindy Arias is a 77 y.o. female who presents to the office reporting right shoulder pain.  MRI scan of the shoulder has been performed.  This does show torn and moderately retracted posterior superior rotator cuff.  MRI scan is reviewed with the patient.  Injection in the spring did help her.  That was done in June.  She is right-hand dominant.  Has husband at home that she is responsible for in many ways.  Pain is bothering her at night.  She does have forward flexion and abduction actively just below 90..                ROS: All systems reviewed are negative as they relate to the chief complaint within the history of present illness.  Patient denies fevers or chills.  Assessment & Plan: Visit Diagnoses:  1. Right shoulder pain, unspecified chronicity     Plan: Impression is right shoulder rotator cuff pathology without much in terms of superior humeral head arthritis or glenohumeral joint arthritis.  Tears may or may not be repairable.  If we were considering repair would have to do augmentation with the patch.  Plan at this time is consideration of repeat shoulder injection in mid October.  Her responsibilities to her husband at this time preclude any type of elective surgery.  She does have reasonable function with that right arm.  She will follow-up with [place] as needed in October if she wants to consider another injection.  Follow-Up Instructions: No follow-ups on file.   Orders:  No orders of the defined types were placed in this encounter.  No orders of the defined types were placed in this encounter.     Procedures: No procedures  performed   Clinical Data: No additional findings.  Objective: Vital Signs: LMP 02/13/2012   Physical Exam:  Constitutional: Patient appears well-developed HEENT:  Head: Normocephalic Eyes:EOM are normal Neck: Normal range of motion Cardiovascular: Normal rate Pulmonary/chest: Effort normal Neurologic: Patient is alert Skin: Skin is warm Psychiatric: Patient has normal mood and affect  Ortho Exam: Ortho exam demonstrates slight weakness with external rotation of the right compared to the left.  Subscap strength 5+ out of 5 bilaterally.  No Popeye deformity in the right arm.  O'Brien's testing equivocal on the right negative on the left.  Does have a little bit of coarseness with internal/external rotation 9 degrees of abduction.  Cervical spine range of motion intact.  Specialty Comments:  No specialty comments available.  Imaging: No results found.   PMFS History: Patient Active Problem List   Diagnosis Date Noted   Pain in right shoulder 04/17/2023   Callus between toes 01/24/2022   Hammertoe of second toe of right foot 01/11/2021   Pain in right foot 01/05/2020   Primary osteoarthritis of left knee 11/04/2018   History of left knee replacement 11/04/2018   Bilateral primary osteoarthritis of knee 02/21/2018   Carcinoma of central portion of right breast in female, estrogen receptor positive (HCC) 12/21/2016   Malignant neoplasm of overlapping sites of right  breast in female, estrogen receptor positive (HCC) 11/19/2016   Past Medical History:  Diagnosis Date   Arthritis    "bone on bone'  left knee   Asthma    LAST FLARE UP 3 YRS AGO   Breast cancer (HCC)    RIGHT BREAST   Colitis    Dysphagia    Dyspnea    NONE IN 3 YRS   Esophageal stricture    GERD (gastroesophageal reflux disease)    History of infertility    History of radiation therapy 12/31/16- 01/21/17   Right Breast 42.56 Gy in 16 fractions   Osteopenia    Pneumonia    Rosacea    Ulcerative  colitis (HCC)     Family History  Problem Relation Age of Onset   Cancer Mother        Liver- 2003 Lymphoma   Rheum arthritis Mother    Stroke Father    Diabetes Maternal Grandfather        Type 2    Heart disease Maternal Grandfather    Hypertension Maternal Grandmother    Heart disease Maternal Grandmother    Osteoporosis Maternal Grandmother    Anesthesia problems Neg Hx     Past Surgical History:  Procedure Laterality Date   BREAST LUMPECTOMY WITH RADIOACTIVE SEED AND SENTINEL LYMPH NODE BIOPSY Right 11/21/2016   Procedure: BREAST LUMPECTOMY WITH RADIOACTIVE SEED AND SENTINEL LYMPH NODE BIOPSY;  Surgeon: Glenna Fellows, MD;  Location: [facility];  Service: General;  Laterality: Right;   BREAST SURGERY     RIGHT BREAST LUMPECTOMY   BUNIONECTOMY WITH HAMMERTOE RECONSTRUCTION Left 07/2014   CATARACT EXTRACTION Right 10/2015   ESOPHAGEAL DILATION     EYE SURGERY     OD   JOINT REPLACEMENT     Repair of Chiari Malformation  02/05/13   SUBOCCIPITAL CRANIECTOMY CERVICAL LAMINECTOMY  02/13/2012   Procedure: SUBOCCIPITAL CRANIECTOMY CERVICAL LAMINECTOMY/DURAPLASTY;  Surgeon: Cristi Loron, MD;  Location: [facility];  Service: Neurosurgery;  Laterality: N/A;  Suboccipital craniectomy with upper cervical laminectomy and duraplasty   TOTAL KNEE ARTHROPLASTY Left 11/04/2018   TOTAL KNEE ARTHROPLASTY Left 11/04/2018   Procedure: LEFT TOTAL KNEE ARTHROPLASTY;  Surgeon: Valeria Batman, MD;  Location: [facility];  Service: Orthopedics;  Laterality: Left;   TUBAL LIGATION  1987   Social History   Occupational History   Not on file  Tobacco Use   Smoking status: Never   Smokeless tobacco: Never   Tobacco comments:    she smoked for a few months in college  Vaping Use   Vaping status: Never Used  Substance and Sexual Activity   Alcohol use: Yes    Comment: 1 glass of wine monthly   Drug use: No    Comment: Smoked in college ~ 2 years   Sexual activity: Not on  file

## 2023-07-09 ENCOUNTER — Other Ambulatory Visit (HOSPITAL_BASED_OUTPATIENT_CLINIC_OR_DEPARTMENT_OTHER): Payer: Self-pay

## 2023-07-12 ENCOUNTER — Other Ambulatory Visit (HOSPITAL_BASED_OUTPATIENT_CLINIC_OR_DEPARTMENT_OTHER): Payer: Self-pay

## 2023-07-12 MED ORDER — COMIRNATY 30 MCG/0.3ML IM SUSY
0.3000 mL | PREFILLED_SYRINGE | Freq: Once | INTRAMUSCULAR | 0 refills | Status: AC
  Filled 2023-07-12: qty 0.3, 1d supply, fill #0

## 2023-08-06 ENCOUNTER — Encounter: Payer: Self-pay | Admitting: Allergy and Immunology

## 2023-08-06 ENCOUNTER — Other Ambulatory Visit: Payer: Self-pay

## 2023-08-06 ENCOUNTER — Ambulatory Visit (INDEPENDENT_AMBULATORY_CARE_PROVIDER_SITE_OTHER): Payer: Medicare Other | Admitting: Allergy and Immunology

## 2023-08-06 VITALS — BP 124/76 | HR 68 | Temp 98.4°F | Resp 16 | Ht 60.0 in | Wt 115.8 lb

## 2023-08-06 DIAGNOSIS — J3089 Other allergic rhinitis: Secondary | ICD-10-CM | POA: Diagnosis not present

## 2023-08-06 DIAGNOSIS — J4540 Moderate persistent asthma, uncomplicated: Secondary | ICD-10-CM

## 2023-08-06 DIAGNOSIS — K219 Gastro-esophageal reflux disease without esophagitis: Secondary | ICD-10-CM

## 2023-08-06 MED ORDER — BUDESONIDE-FORMOTEROL FUMARATE 160-4.5 MCG/ACT IN AERO: 2.0000 | INHALATION_SPRAY | Freq: Two times a day (BID) | RESPIRATORY_TRACT | 2 refills | Status: DC

## 2023-08-06 MED ORDER — OMEPRAZOLE 40 MG PO CPDR: 40.0000 mg | DELAYED_RELEASE_CAPSULE | Freq: Every day | ORAL | 3 refills | Status: DC

## 2023-08-06 MED ORDER — LEVALBUTEROL TARTRATE 45 MCG/ACT IN AERO: INHALATION_SPRAY | RESPIRATORY_TRACT | 2 refills | Status: DC

## 2023-08-06 NOTE — Progress Notes (Unsigned)
[facility] [place] [facility] [place] [facility] [place] Cindy Arias   Follow-up Note  Referring Provider: Gweneth Dimitri, MD Primary Provider: Gweneth Dimitri, MD Date of Office Visit: 08/06/2023  Subjective:   Cindy Arias (DOB: 07-Feb-1946) is a 77 y.o. female who [facility] in re-evaluation of the following:  HPI: Cindy Arias returns to this clinic in evaluation of asthma, allergic rhinitis, LPR.  I last saw her in this clinic 28 August 2022.  She has really done well with her asthma and has not required a systemic steroid to treat an exacerbation and has not had a significant need to use the short acting bronchodilator and she walks 2-1/2 miles per day.  Sometimes she does get a little short of breath when walking but her recovery time is just minutes.  Her use of Symbicort is 1 time per day at this point.  She has had very little issues with her upper airway.  Her reflux is under good control while using omeprazole daily.  She will be having a cortisone injection in her right shoulder next week.  She is already had this years COVID-vaccine.  Allergies as of 08/06/2023       Reactions   Penicillins Hives, Other (See Comments)   UNSPECIFIED CHILDHOOD REACTION  Had reaction as a child and as an adult. Had additional reaction but not clear on the specifics   Sulfa Antibiotics Hives   Albuterol Other (See Comments)   UNSPECIFIED REACTION  States she cannot tolerate albuterol without side effects        Medication List    Arexvy 120 MCG/0.5ML injection Generic drug: RSV vaccine recomb adjuvanted Inject into the muscle.   budesonide-formoterol 160-4.5 MCG/ACT inhaler Commonly known as: Symbicort Inhale 2 puffs into the lungs in the morning and at bedtime.   CALCIUM CITRATE-VITAMIN D PO Take 1 tablet by mouth daily.   famotidine 40 MG tablet Commonly known as: PEPCID Take 1 tablet (40 mg total) by mouth daily.   fluticasone  50 MCG/ACT nasal spray Commonly known as: FLONASE Place 2 sprays into both nostrils daily as needed for allergies or rhinitis.   levalbuterol 45 MCG/ACT inhaler Commonly known as: XOPENEX HFA Inhale two puffs every 4-6 hours if needed for cough or wheeze   mesalamine 1.2 g EC tablet Commonly known as: LIALDA Take 1.2 g by mouth daily with breakfast.   multivitamins with iron Tabs tablet Take 1 tablet by mouth daily.   omeprazole 40 MG capsule Commonly known as: PRILOSEC Take 1 capsule by mouth 1-2 times a day   Reclast 5 MG/100ML Soln injection Generic drug: zoledronic acid   sodium chloride 0.65 % nasal spray Commonly known as: OCEAN Place 1 spray into the nose daily as needed for congestion.   tretinoin 0.025 % cream Commonly known as: RETIN-A Apply 1 Application topically at bedtime.   triamcinolone cream 0.1 % Commonly known as: KENALOG Apply 1 Application topically 4 (four) times daily.   vitamin E 180 MG (400 UNITS) capsule Take 400 Units by mouth daily.     Past Medical History:  Diagnosis Date  . Arthritis    "bone on bone'  left knee  . Asthma    LAST FLARE UP 3 YRS AGO  . Breast cancer (HCC)    RIGHT BREAST  . Colitis   . Dysphagia   . Dyspnea    NONE IN 3 YRS  . Esophageal stricture   . GERD (gastroesophageal  reflux disease)   . History of infertility   . History of radiation therapy 12/31/16- 01/21/17   Right Breast 42.56 Gy in 16 fractions  . Osteopenia   . Pneumonia   . Rosacea   . Ulcerative colitis [facility])     Past Surgical History:  Procedure Laterality Date  . BREAST LUMPECTOMY WITH RADIOACTIVE SEED AND SENTINEL LYMPH NODE BIOPSY Right 11/21/2016   Procedure: BREAST LUMPECTOMY WITH RADIOACTIVE SEED AND SENTINEL LYMPH NODE BIOPSY;  Surgeon: Glenna Fellows, MD;  Location: [facility];  Service: General;  Laterality: Right;  . BREAST SURGERY     RIGHT BREAST LUMPECTOMY  . BUNIONECTOMY WITH HAMMERTOE RECONSTRUCTION Left  07/2014  . CATARACT EXTRACTION Right 10/2015  . ESOPHAGEAL DILATION    . EYE SURGERY     OD  . JOINT REPLACEMENT    . Repair of Chiari Malformation  02/05/13  . SUBOCCIPITAL CRANIECTOMY CERVICAL LAMINECTOMY  02/13/2012   Procedure: SUBOCCIPITAL CRANIECTOMY CERVICAL LAMINECTOMY/DURAPLASTY;  Surgeon: Cristi Loron, MD;  Location: [facility];  Service: Neurosurgery;  Laterality: N/A;  Suboccipital craniectomy with upper cervical laminectomy and duraplasty  . TOTAL KNEE ARTHROPLASTY Left 11/04/2018  . TOTAL KNEE ARTHROPLASTY Left 11/04/2018   Procedure: LEFT TOTAL KNEE ARTHROPLASTY;  Surgeon: Valeria Batman, MD;  Location: [facility];  Service: Orthopedics;  Laterality: Left;  . TUBAL LIGATION  1987    Review of systems negative except as noted in HPI / PMHx or noted below:  Review of Systems  Constitutional: Negative.   HENT: Negative.    Eyes: Negative.   Respiratory: Negative.    Cardiovascular: Negative.   Gastrointestinal: Negative.   Genitourinary: Negative.   Musculoskeletal: Negative.   Skin: Negative.   Neurological: Negative.   Endo/Heme/Allergies: Negative.   Psychiatric/Behavioral: Negative.       Objective:   Vitals:   08/06/23 0919  BP: 124/76  Pulse: 68  Resp: 16  Temp: 98.4 F (36.9 C)  SpO2: 95%   Height: 5' (152.4 cm)  Weight: 115 lb 12.8 oz (52.5 kg)   Physical Exam Constitutional:      Appearance: She is not diaphoretic.  HENT:     Head: Normocephalic.     Right Ear: Tympanic membrane, ear canal and external ear normal.     Left Ear: Tympanic membrane, ear canal and external ear normal.     Nose: Nose normal. No mucosal edema or rhinorrhea.     Mouth/Throat:     Pharynx: Uvula midline. No oropharyngeal exudate.  Eyes:     Conjunctiva/sclera: Conjunctivae normal.  Neck:     Thyroid: No thyromegaly.     Trachea: Trachea normal. No tracheal tenderness or tracheal deviation.  Cardiovascular:     Rate and Rhythm: Normal rate and regular  rhythm.     Heart sounds: Normal heart sounds, S1 normal and S2 normal. No murmur heard. Pulmonary:     Effort: No respiratory distress.     Breath sounds: Normal breath sounds. No stridor. No wheezing or rales.  Lymphadenopathy:     Head:     Right side of head: No tonsillar adenopathy.     Left side of head: No tonsillar adenopathy.     Cervical: No cervical adenopathy.  Skin:    Findings: No erythema or rash.     Nails: There is no clubbing.  Neurological:     Mental Status: She is alert.    Diagnostics:    Spirometry was performed and demonstrated an FEV1 of  1.77 at 99 % of predicted.  The patient had an Asthma Control Test with the following results: ACT Total Score: 23.    Assessment and Plan:   1. Asthma, moderate persistent, well-controlled   2. Other allergic rhinitis   3. LPRD (laryngopharyngeal reflux disease)    1. Treat inflammation:   A. Symbicort 160-2 inhalations 1-2 times a day  2. Continue treatment for reflux:   A. Omeprazole 40 mg 1 time a day  3. Continue the following if needed:   A. antihistamine   B. Nasal saline  C. Symbicort 160 - 2 inhalations every 6 hours  4. Return to clinic in 1 year or earlier if problem   5. Obtain flu vaccine Halloween 2024  Emiliah will continue to use Symbicort at relatively low dose and continue to use omeprazole daily to address the inflammatory component and the reflux induced component of her respiratory tract issues.  She can use her Symbicort as an anti-inflammatory rescue medicine as well.  Because she is going to be receiving a systemic steroid injection next week I have asked her to delay the administration of her flu vaccine until Halloween.  If she does well with the plan noted above I will see her back in this clinic in 1 year or earlier if there is a problem.  Laurette Schimke, MD Allergy / Immunology [facility]

## 2023-08-06 NOTE — Patient Instructions (Addendum)
  1. Treat inflammation:   A. Symbicort 160-2 inhalations 1-2 times a day  2. Continue treatment for reflux:   A. Omeprazole 40 mg 1 time a day  3. Continue the following if needed:   A. antihistamine   B. Nasal saline  C. Symbicort 160 - 2 inhalations every 6 hours  4. Return to clinic in 1 year or earlier if problem   5. Obtain flu vaccine Halloween 2024

## 2023-08-07 ENCOUNTER — Encounter: Payer: Self-pay | Admitting: Allergy and Immunology

## 2023-08-12 ENCOUNTER — Ambulatory Visit: Payer: Medicare Other | Admitting: Orthopedic Surgery

## 2023-08-12 DIAGNOSIS — M25511 Pain in right shoulder: Secondary | ICD-10-CM | POA: Diagnosis not present

## 2023-08-12 DIAGNOSIS — M12811 Other specific arthropathies, not elsewhere classified, right shoulder: Secondary | ICD-10-CM | POA: Diagnosis not present

## 2023-08-12 NOTE — Progress Notes (Unsigned)
Office Visit Note   Patient: Cindy Arias           Date of Birth: [birth year]           MRN: [id number] Visit Date: 08/12/2023 Requested by: Gweneth Dimitri, MD [address] [place],  [place] [address] PCP: Gweneth Dimitri, MD  Subjective: Chief Complaint  Patient presents with   Right Shoulder - Pain    HPI: Cindy Arias is a 77 y.o. female who presents to the office reporting right shoulder pain.  Prior shoulder injection 04/17/2023 gave her very good relief.  She has known rotator cuff arthropathy but cannot really have surgery at this time due to taking care of her husband.  No interval history of injury..                ROS: All systems reviewed are negative as they relate to the chief complaint within the history of present illness.  Patient denies fevers or chills.  Assessment & Plan: Visit Diagnoses:  1. Rotator cuff arthropathy of right shoulder     Plan: Impression is right shoulder rotator cuff arthropathy.  Glenohumeral injection performed today through posterior approach.  She will follow-up with [place] as needed.  Follow-Up Instructions: No follow-ups on file.   Orders:  No orders of the defined types were placed in this encounter.  No orders of the defined types were placed in this encounter.     Procedures: Large Joint Inj: R glenohumeral on 08/12/2023 10:40 PM Indications: diagnostic evaluation and pain Details: 18 G 1.5 in needle, posterior approach  Arthrogram: No  Medications: 9 mL bupivacaine 0.5 %; 40 mg methylPREDNISolone acetate 40 MG/ML; 5 mL lidocaine 1 % Outcome: tolerated well, no immediate complications Procedure, treatment alternatives, risks and benefits explained, specific risks discussed. Consent was given by the patient. Immediately prior to procedure a time out was called to verify the correct patient, procedure, equipment, support staff and site/side marked as required. Patient was prepped and draped in the usual sterile  fashion.       Clinical Data: No additional findings.  Objective: Vital Signs: LMP 02/13/2012   Physical Exam:  Constitutional: Patient appears well-developed HEENT:  Head: Normocephalic Eyes:EOM are normal Neck: Normal range of motion Cardiovascular: Normal rate Pulmonary/chest: Effort normal Neurologic: Patient is alert Skin: Skin is warm Psychiatric: Patient has normal mood and affect  Ortho Exam: Ortho exam demonstrates some weakness to external rotation strength testing on the right.  She has functional deltoid.  Some coarseness and grinding with internal/external rotation of that right forearm.  She does have overhead motion with forward flexion and abduction both actively above 90 degrees.  Specialty Comments:  No specialty comments available.  Imaging: No results found.   PMFS History: Patient Active Problem List   Diagnosis Date Noted   Pain in right shoulder 04/17/2023   Callus between toes 01/24/2022   Hammertoe of second toe of right foot 01/11/2021   Pain in right foot 01/05/2020   Primary osteoarthritis of left knee 11/04/2018   History of left knee replacement 11/04/2018   Bilateral primary osteoarthritis of knee 02/21/2018   Carcinoma of central portion of right breast in female, estrogen receptor positive (HCC) 12/21/2016   Malignant neoplasm of overlapping sites of right breast in female, estrogen receptor positive (HCC) 11/19/2016   Past Medical History:  Diagnosis Date   Arthritis    "bone on bone'  left knee   Asthma    LAST FLARE UP 3  YRS AGO   Breast cancer (HCC)    RIGHT BREAST   Colitis    Dysphagia    Dyspnea    NONE IN 3 YRS   Esophageal stricture    GERD (gastroesophageal reflux disease)    History of infertility    History of radiation therapy 12/31/16- 01/21/17   Right Breast 42.56 Gy in 16 fractions   Osteopenia    Pneumonia    Rosacea    Ulcerative colitis (HCC)     Family History  Problem Relation Age of Onset    Cancer Mother        Liver- 2003 Lymphoma   Rheum arthritis Mother    Stroke Father    Diabetes Maternal Grandfather        Type 2    Heart disease Maternal Grandfather    Hypertension Maternal Grandmother    Heart disease Maternal Grandmother    Osteoporosis Maternal Grandmother    Anesthesia problems Neg Hx     Past Surgical History:  Procedure Laterality Date   BREAST LUMPECTOMY WITH RADIOACTIVE SEED AND SENTINEL LYMPH NODE BIOPSY Right 11/21/2016   Procedure: BREAST LUMPECTOMY WITH RADIOACTIVE SEED AND SENTINEL LYMPH NODE BIOPSY;  Surgeon: Glenna Fellows, MD;  Location: [facility];  Service: General;  Laterality: Right;   BREAST SURGERY     RIGHT BREAST LUMPECTOMY   BUNIONECTOMY WITH HAMMERTOE RECONSTRUCTION Left 07/2014   CATARACT EXTRACTION Right 10/2015   ESOPHAGEAL DILATION     EYE SURGERY     OD   JOINT REPLACEMENT     Repair of Chiari Malformation  02/05/13   SUBOCCIPITAL CRANIECTOMY CERVICAL LAMINECTOMY  02/13/2012   Procedure: SUBOCCIPITAL CRANIECTOMY CERVICAL LAMINECTOMY/DURAPLASTY;  Surgeon: Cristi Loron, MD;  Location: [facility];  Service: Neurosurgery;  Laterality: N/A;  Suboccipital craniectomy with upper cervical laminectomy and duraplasty   TOTAL KNEE ARTHROPLASTY Left 11/04/2018   TOTAL KNEE ARTHROPLASTY Left 11/04/2018   Procedure: LEFT TOTAL KNEE ARTHROPLASTY;  Surgeon: Valeria Batman, MD;  Location: [facility];  Service: Orthopedics;  Laterality: Left;   TUBAL LIGATION  1987   Social History   Occupational History   Not on file  Tobacco Use   Smoking status: Never   Smokeless tobacco: Never   Tobacco comments:    she smoked for a few months in college  Vaping Use   Vaping status: Never Used  Substance and Sexual Activity   Alcohol use: Yes    Comment: 1 glass of wine monthly   Drug use: No    Comment: Smoked in college ~ 2 years   Sexual activity: Not on file

## 2023-08-13 ENCOUNTER — Encounter: Payer: Self-pay | Admitting: Orthopedic Surgery

## 2023-08-13 MED ORDER — BUPIVACAINE HCL 0.5 % IJ SOLN
9.0000 mL | INTRAMUSCULAR | Status: AC | PRN
Start: 2023-08-12 — End: 2023-08-12
  Administered 2023-08-12: 9 mL via INTRA_ARTICULAR

## 2023-08-13 MED ORDER — METHYLPREDNISOLONE ACETATE 40 MG/ML IJ SUSP
40.0000 mg | INTRAMUSCULAR | Status: AC | PRN
Start: 2023-08-12 — End: 2023-08-12
  Administered 2023-08-12: 40 mg via INTRA_ARTICULAR

## 2023-08-13 MED ORDER — LIDOCAINE HCL 1 % IJ SOLN
5.0000 mL | INTRAMUSCULAR | Status: AC | PRN
Start: 2023-08-12 — End: 2023-08-12
  Administered 2023-08-12: 5 mL

## 2023-08-21 ENCOUNTER — Telehealth: Payer: Self-pay | Admitting: Allergy and Immunology

## 2023-08-21 NOTE — Telephone Encounter (Signed)
Patient called stating she needed an ENT referral as the ENT office told her she needed one due to a [facility] policy. She didn't understand why she need an ENT referral as she stated already sees a doctor at the ENT office. Patient would like a call back as soon as the referral been place.  Best Contact:[phone]

## 2023-08-27 NOTE — Telephone Encounter (Signed)
Referral has been placed to [facility]. Patient has been informed and will call them to get scheduled.

## 2023-09-04 ENCOUNTER — Telehealth: Payer: Self-pay | Admitting: Allergy and Immunology

## 2023-09-04 NOTE — Telephone Encounter (Signed)
Called patient back - DOB/Pharmacy verified - stated the following symptoms started about 4 days ago:  Cough - was productive now dry Hoarsness ( Laryngitis) now  Patient denies the following: Fever Sinus pressure Ear pressure/pain Headache Bodyaches Shortness of breath  Patient stated she has tried OTC Mucinex DM with no relief - Robitussin Dm does nothing for her.  Patient advised message would be forwared to provider for next step.   Patient verbalized understanding, no further questions.

## 2023-09-04 NOTE — Telephone Encounter (Signed)
Pt states she has a nagging cough and congestion, cough is causing her not to sleep. Pt would like a call back.

## 2023-09-05 ENCOUNTER — Telehealth: Payer: Self-pay | Admitting: Allergy and Immunology

## 2023-09-05 MED ORDER — TESSALON PERLES 100 MG PO CAPS: 100.0000 mg | ORAL_CAPSULE | Freq: Three times a day (TID) | ORAL | 0 refills | Status: DC | PRN

## 2023-09-05 MED ORDER — BENZONATATE 100 MG PO CAPS: 100.0000 mg | ORAL_CAPSULE | Freq: Three times a day (TID) | ORAL | 0 refills | Status: DC | PRN

## 2023-09-05 MED ORDER — PREDNISONE 10 MG PO TABS: 10.0000 mg | ORAL_TABLET | Freq: Every day | ORAL | 0 refills | Status: DC

## 2023-09-05 NOTE — Telephone Encounter (Signed)
Per Provider:   She can use the pearles 3 times a day if needed.    Sent in Prednisone and Tessalon Pearles prescription to pharmacy.

## 2023-09-05 NOTE — Telephone Encounter (Signed)
Per Provider:  She can use prednisone 10 mg - 1 tablet 1 time per day for 5 days.   Called patient - requesting Tessalon pearles as well - helps her cough a lot.

## 2023-09-05 NOTE — Telephone Encounter (Signed)
Toniann Fail called from [facility] about the [employer] TESSALON PERLES 100 MG capsule and requested a call back [phone]

## 2023-09-05 NOTE — Telephone Encounter (Signed)
Per Provider:   Please inform Donelle that I was off of work yesterday and only saw this message this morning.  Has she obtained a COVID swab?  Called patient - DOB verified - stated she tested on Sunday, 09/01/23 - COVID Negative.

## 2023-09-05 NOTE — Telephone Encounter (Signed)
[facility] - Spoke to [place]  - DOB verified - stated they do not have the [employer] only GENERIC.   Toniann Fail was advised that per our data base GENERIC was showing as Not Reimbursable.  Wendy reprocessed prescription to see what it showed on her end - went through.   Resending GENERIC Rx for Benzonatate Metallurgist) to pharmacy.

## 2023-09-10 ENCOUNTER — Other Ambulatory Visit: Payer: Self-pay | Admitting: Medical Genetics

## 2023-09-10 DIAGNOSIS — Z006 Encounter for examination for normal comparison and control in clinical research program: Secondary | ICD-10-CM

## 2023-09-16 ENCOUNTER — Other Ambulatory Visit (HOSPITAL_BASED_OUTPATIENT_CLINIC_OR_DEPARTMENT_OTHER): Payer: Self-pay

## 2023-09-16 MED ORDER — FLUAD 0.5 ML IM SUSY
0.5000 mL | PREFILLED_SYRINGE | Freq: Once | INTRAMUSCULAR | 0 refills | Status: AC
  Filled 2023-09-16: qty 0.5, 1d supply, fill #0

## 2023-10-02 ENCOUNTER — Ambulatory Visit (INDEPENDENT_AMBULATORY_CARE_PROVIDER_SITE_OTHER): Payer: Medicare Other | Admitting: Otolaryngology

## 2023-10-02 ENCOUNTER — Encounter (INDEPENDENT_AMBULATORY_CARE_PROVIDER_SITE_OTHER): Payer: Self-pay

## 2023-10-02 VITALS — Ht 60.0 in | Wt 112.0 lb

## 2023-10-02 DIAGNOSIS — J310 Chronic rhinitis: Secondary | ICD-10-CM | POA: Insufficient documentation

## 2023-10-02 DIAGNOSIS — R0981 Nasal congestion: Secondary | ICD-10-CM

## 2023-10-02 DIAGNOSIS — H6983 Other specified disorders of Eustachian tube, bilateral: Secondary | ICD-10-CM

## 2023-10-02 DIAGNOSIS — H6123 Impacted cerumen, bilateral: Secondary | ICD-10-CM | POA: Diagnosis not present

## 2023-10-02 NOTE — Progress Notes (Signed)
Patient ID: Cindy Arias, female   DOB: 08/04/1946, 77 y.o.   MRN: [id number]  Follow-up: Nasal congestion, eustachian tube dysfunction, hearing loss  HPI: The patient is a 77 year old female who returns today for her follow-up evaluation.  The patient was last seen in September 2022.  At that time, she was noted to have chronic rhinitis with nasal congestion and bilateral eustachian tube dysfunction.  She was also noted to have stable bilateral high-frequency sensorineural hearing loss.  The patient was treated with Flonase nasal spray and Valsalva exercise.  The patient returns today reporting improvement in her nasal congestion and eustachian tube dysfunction.  She uses Flonase nasal spray as needed.  She has noted only occasional nasal congestion.  She denies any significant change in her hearing.  Exam: General: Communicates without difficulty, well nourished, no acute distress. Head: Normocephalic, no evidence injury, no tenderness, facial buttresses intact without stepoff. Face/sinus: No tenderness to palpation and percussion. Facial movement is normal and symmetric. Eyes: PERRL, EOMI. No scleral icterus, conjunctivae clear. Neuro: CN II exam reveals vision grossly intact.  No nystagmus at any point of gaze. Ears: Auricles well formed without lesions.  Bilateral cerumen impaction.  Nose: External evaluation reveals normal support and skin without lesions.  Dorsum is intact.  Anterior rhinoscopy reveals congested mucosa over anterior aspect of inferior turbinates and intact septum.  No purulence noted. Oral:  Oral cavity and oropharynx are intact, symmetric, without erythema or edema.  Mucosa is moist without lesions. Neck: Full range of motion without pain.  There is no significant lymphadenopathy.  No masses palpable.  Thyroid bed within normal limits to palpation.  Parotid glands and submandibular glands equal bilaterally without mass.  Trachea is midline. Neuro:  CN 2-12 grossly intact.     Procedure: Bilateral cerumen disimpaction Anesthesia: None Description: Under the operating microscope, the cerumen is carefully removed with a combination of cerumen currette, alligator forceps, and suction catheters.  After the cerumen is removed, the TMs are noted to be normal.  No mass, erythema, or lesions. The patient tolerated the procedure well.    Assessment: 1.  Bilateral cerumen impaction.  After the disimpaction procedure, both tympanic membranes and middle ear spaces are noted to be normal. 2.  Chronic rhinitis with moderate nasal mucosal congestion. 3.  Her clinical eustachian tube dysfunction has improved.  Plan: 1.  Otomicroscopy with bilateral cerumen disimpaction. 2.  The physical exam findings are reviewed with the patient. 3.  Continue the use of Flonase nasal spray as needed. 4.  The patient will return for reevaluation in 1 year, sooner if needed.

## 2023-11-11 ENCOUNTER — Encounter (HOSPITAL_BASED_OUTPATIENT_CLINIC_OR_DEPARTMENT_OTHER): Payer: Self-pay | Admitting: Emergency Medicine

## 2023-11-11 ENCOUNTER — Other Ambulatory Visit: Payer: Self-pay

## 2023-11-11 ENCOUNTER — Inpatient Hospital Stay (HOSPITAL_BASED_OUTPATIENT_CLINIC_OR_DEPARTMENT_OTHER)
Admission: EM | Admit: 2023-11-11 | Discharge: 2023-11-13 | DRG: 184 | Disposition: A | Discharge status: Home Health | Payer: Medicare Other | Attending: Surgery | Admitting: Surgery

## 2023-11-11 ENCOUNTER — Emergency Department (HOSPITAL_BASED_OUTPATIENT_CLINIC_OR_DEPARTMENT_OTHER): Payer: Medicare Other

## 2023-11-11 DIAGNOSIS — M25512 Pain in left shoulder: Secondary | ICD-10-CM | POA: Diagnosis present

## 2023-11-11 DIAGNOSIS — Z923 Personal history of irradiation: Secondary | ICD-10-CM

## 2023-11-11 DIAGNOSIS — Z7951 Long term (current) use of inhaled steroids: Secondary | ICD-10-CM

## 2023-11-11 DIAGNOSIS — Z888 Allergy status to other drugs, medicaments and biological substances status: Secondary | ICD-10-CM | POA: Diagnosis not present

## 2023-11-11 DIAGNOSIS — Z79899 Other long term (current) drug therapy: Secondary | ICD-10-CM

## 2023-11-11 DIAGNOSIS — Z880 Allergy status to penicillin: Secondary | ICD-10-CM

## 2023-11-11 DIAGNOSIS — Z8262 Family history of osteoporosis: Secondary | ICD-10-CM

## 2023-11-11 DIAGNOSIS — J45909 Unspecified asthma, uncomplicated: Secondary | ICD-10-CM | POA: Diagnosis present

## 2023-11-11 DIAGNOSIS — Z96652 Presence of left artificial knee joint: Secondary | ICD-10-CM | POA: Diagnosis present

## 2023-11-11 DIAGNOSIS — Z853 Personal history of malignant neoplasm of breast: Secondary | ICD-10-CM | POA: Diagnosis not present

## 2023-11-11 DIAGNOSIS — I700 Atherosclerosis of aorta: Secondary | ICD-10-CM | POA: Diagnosis present

## 2023-11-11 DIAGNOSIS — W109XXA Fall (on) (from) unspecified stairs and steps, initial encounter: Secondary | ICD-10-CM | POA: Diagnosis present

## 2023-11-11 DIAGNOSIS — C259 Malignant neoplasm of pancreas, unspecified: Secondary | ICD-10-CM | POA: Diagnosis present

## 2023-11-11 DIAGNOSIS — S062XAA Diffuse traumatic brain injury with loss of consciousness status unknown, initial encounter: Secondary | ICD-10-CM | POA: Diagnosis present

## 2023-11-11 DIAGNOSIS — Z8249 Family history of ischemic heart disease and other diseases of the circulatory system: Secondary | ICD-10-CM | POA: Diagnosis not present

## 2023-11-11 DIAGNOSIS — R402142 Coma scale, eyes open, spontaneous, at arrival to emergency department: Secondary | ICD-10-CM | POA: Diagnosis present

## 2023-11-11 DIAGNOSIS — Z8261 Family history of arthritis: Secondary | ICD-10-CM | POA: Diagnosis not present

## 2023-11-11 DIAGNOSIS — Z833 Family history of diabetes mellitus: Secondary | ICD-10-CM | POA: Diagnosis not present

## 2023-11-11 DIAGNOSIS — Z807 Family history of other malignant neoplasms of lymphoid, hematopoietic and related tissues: Secondary | ICD-10-CM | POA: Diagnosis not present

## 2023-11-11 DIAGNOSIS — C787 Secondary malignant neoplasm of liver and intrahepatic bile duct: Secondary | ICD-10-CM | POA: Diagnosis present

## 2023-11-11 DIAGNOSIS — Z87891 Personal history of nicotine dependence: Secondary | ICD-10-CM

## 2023-11-11 DIAGNOSIS — S8001XA Contusion of right knee, initial encounter: Secondary | ICD-10-CM | POA: Diagnosis present

## 2023-11-11 DIAGNOSIS — Z9841 Cataract extraction status, right eye: Secondary | ICD-10-CM | POA: Diagnosis not present

## 2023-11-11 DIAGNOSIS — S2249XA Multiple fractures of ribs, unspecified side, initial encounter for closed fracture: Secondary | ICD-10-CM | POA: Diagnosis present

## 2023-11-11 DIAGNOSIS — K219 Gastro-esophageal reflux disease without esophagitis: Secondary | ICD-10-CM | POA: Diagnosis present

## 2023-11-11 DIAGNOSIS — Z882 Allergy status to sulfonamides status: Secondary | ICD-10-CM | POA: Diagnosis not present

## 2023-11-11 DIAGNOSIS — R402362 Coma scale, best motor response, obeys commands, at arrival to emergency department: Secondary | ICD-10-CM | POA: Diagnosis present

## 2023-11-11 DIAGNOSIS — S2242XA Multiple fractures of ribs, left side, initial encounter for closed fracture: Principal | ICD-10-CM | POA: Diagnosis present

## 2023-11-11 DIAGNOSIS — Z823 Family history of stroke: Secondary | ICD-10-CM | POA: Diagnosis not present

## 2023-11-11 DIAGNOSIS — K8689 Other specified diseases of pancreas: Secondary | ICD-10-CM

## 2023-11-11 DIAGNOSIS — R402252 Coma scale, best verbal response, oriented, at arrival to emergency department: Secondary | ICD-10-CM | POA: Diagnosis present

## 2023-11-11 LAB — LIPASE, BLOOD: Lipase: 29 U/L (ref 11–51)

## 2023-11-11 LAB — CBC
HCT: 40.2 % (ref 36.0–46.0)
Hemoglobin: 13.3 g/dL (ref 12.0–15.0)
MCH: 28.9 pg (ref 26.0–34.0)
MCHC: 33.1 g/dL (ref 30.0–36.0)
MCV: 87.4 fL (ref 80.0–100.0)
Platelets: 240 10*3/uL (ref 150–400)
RBC: 4.6 MIL/uL (ref 3.87–5.11)
RDW: 13.4 % (ref 11.5–15.5)
WBC: 11.1 10*3/uL — ABNORMAL HIGH (ref 4.0–10.5)
nRBC: 0 % (ref 0.0–0.2)

## 2023-11-11 LAB — BASIC METABOLIC PANEL
Anion gap: 8 (ref 5–15)
BUN: 15 mg/dL (ref 8–23)
CO2: 26 mmol/L (ref 22–32)
Calcium: 9.2 mg/dL (ref 8.9–10.3)
Chloride: 105 mmol/L (ref 98–111)
Creatinine, Ser: 0.69 mg/dL (ref 0.44–1.00)
GFR, Estimated: >60 mL/min (ref >60)
Glucose, Bld: 123 mg/dL — ABNORMAL HIGH (ref 70–99)
Potassium: 4.1 mmol/L (ref 3.5–5.1)
Sodium: 139 mmol/L (ref 135–145)

## 2023-11-11 MED ORDER — ENOXAPARIN SODIUM 30 MG/0.3ML IJ SOSY
30.0000 mg | PREFILLED_SYRINGE | Freq: Two times a day (BID) | INTRAMUSCULAR | Status: DC
  Administered 2023-11-12 – 2023-11-13 (×3): 30 mg via SUBCUTANEOUS
  Filled 2023-11-11 (×3): qty 0.3

## 2023-11-11 MED ORDER — METHOCARBAMOL 500 MG PO TABS
500.0000 mg | ORAL_TABLET | Freq: Three times a day (TID) | ORAL | Status: DC
Start: 2023-11-11 — End: 2023-11-14
  Administered 2023-11-11 – 2023-11-12 (×3): 500 mg via ORAL
  Filled 2023-11-11 (×3): qty 1

## 2023-11-11 MED ORDER — HYDRALAZINE HCL 20 MG/ML IJ SOLN: 10.0000 mg | INTRAMUSCULAR | Status: DC | PRN

## 2023-11-11 MED ORDER — ACETAMINOPHEN 500 MG PO TABS
1000.0000 mg | ORAL_TABLET | Freq: Once | ORAL | Status: AC
  Administered 2023-11-11: 1000 mg via ORAL
  Filled 2023-11-11: qty 2

## 2023-11-11 MED ORDER — IOHEXOL 300 MG/ML  SOLN
100.0000 mL | Freq: Once | INTRAMUSCULAR | Status: AC | PRN
  Administered 2023-11-11: 80 mL via INTRAVENOUS

## 2023-11-11 MED ORDER — METHOCARBAMOL 1000 MG/10ML IJ SOLN: 500.0000 mg | Freq: Three times a day (TID) | INTRAMUSCULAR | Status: DC

## 2023-11-11 MED ORDER — MORPHINE SULFATE (PF) 4 MG/ML IV SOLN
4.0000 mg | Freq: Once | INTRAVENOUS | Status: AC
  Administered 2023-11-11: 4 mg via INTRAVENOUS
  Filled 2023-11-11: qty 1

## 2023-11-11 MED ORDER — ONDANSETRON 4 MG PO TBDP: 4.0000 mg | ORAL_TABLET | Freq: Four times a day (QID) | ORAL | Status: DC | PRN

## 2023-11-11 MED ORDER — ONDANSETRON HCL 4 MG/2ML IJ SOLN: 4.0000 mg | Freq: Four times a day (QID) | INTRAMUSCULAR | Status: DC | PRN

## 2023-11-11 MED ORDER — MORPHINE SULFATE (PF) 2 MG/ML IV SOLN: 2.0000 mg | INTRAVENOUS | Status: DC | PRN

## 2023-11-11 MED ORDER — ONDANSETRON HCL 4 MG/2ML IJ SOLN
4.0000 mg | Freq: Once | INTRAMUSCULAR | Status: AC
  Administered 2023-11-11: 4 mg via INTRAVENOUS
  Filled 2023-11-11: qty 2

## 2023-11-11 MED ORDER — POLYETHYLENE GLYCOL 3350 17 G PO PACK: 17.0000 g | PACK | Freq: Every day | ORAL | Status: DC | PRN

## 2023-11-11 MED ORDER — OXYCODONE HCL 5 MG PO TABS
2.5000 mg | ORAL_TABLET | ORAL | Status: DC | PRN
  Administered 2023-11-11 – 2023-11-12 (×2): 5 mg via ORAL
  Filled 2023-11-11 (×2): qty 1

## 2023-11-11 MED ORDER — METOPROLOL TARTRATE 5 MG/5ML IV SOLN: 5.0000 mg | Freq: Four times a day (QID) | INTRAVENOUS | Status: DC | PRN

## 2023-11-11 MED ORDER — ACETAMINOPHEN 500 MG PO TABS
1000.0000 mg | ORAL_TABLET | Freq: Four times a day (QID) | ORAL | Status: DC
  Administered 2023-11-11 – 2023-11-13 (×8): 1000 mg via ORAL
  Filled 2023-11-11 (×8): qty 2

## 2023-11-11 MED ORDER — DOCUSATE SODIUM 100 MG PO CAPS
100.0000 mg | ORAL_CAPSULE | Freq: Two times a day (BID) | ORAL | Status: DC
  Administered 2023-11-11 – 2023-11-13 (×5): 100 mg via ORAL
  Filled 2023-11-11 (×5): qty 1

## 2023-11-11 NOTE — Plan of Care (Signed)

## 2023-11-11 NOTE — ED Provider Notes (Signed)
 [facility] EMERGENCY DEPARTMENT AT [facility] Provider Note   CSN: [id number] Arrival date & time: 11/11/23  0741    History  Chief Complaint  Patient presents with   Cindy   Love Arias is a 78 year old female with a past medical history of asthma and Chiari I s/p surgical repair who presents after fall. Neighbor and husband are bedside. She states she was walking down her stairs yesterday evening around 1930 and began to feel lightheaded.  She fell forward and hit her head on the ground.  She thinks she fell down about 4 stairs.  She is not sure whether she lost consciousness or not.  She denies any prodromal symptoms other than dizziness.  She was able to initially able get up on her own but felt sharp pain in her back and shoulder.  She also noted a welt on the left side of her head. She says she went to bed last night and then was unable to ambulate this morning due to severe pain, so she called EMS. Her pain is most severe in her left posterior flank, left shoulder, neck, and thoracic spine. She cannot sit up on her own or with assistance without severe pain. She denies any confusion, vision changes, headache, fever, chills, shortness of breath. She has never fallen or lost consciousness before.  Denies any history of heart disease.  She also states she lost her voice after the fall, which concerns her because that was the main symptom she presented with when they previously discovered her Chiari malformation.     Home Medications Prior to Admission medications   Medication Sig Start Date End Date Taking? Authorizing Provider  benzonatate  (TESSALON ) 100 MG capsule Take 1 capsule (100 mg total) by mouth 3 (three) times daily as needed for cough. 09/05/23   Kozlow, Camellia PARAS, MD  budesonide -formoterol  (SYMBICORT ) 160-4.5 MCG/ACT inhaler Inhale 2 puffs into the lungs in the morning and at bedtime. 08/06/23   Kozlow, Camellia PARAS, MD  CALCIUM CITRATE-VITAMIN D  PO Take 1 tablet by mouth  daily.     [provider]  famotidine  (PEPCID ) 40 MG tablet Take 1 tablet (40 mg total) by mouth daily. 07/18/21   Kozlow, Camellia PARAS, MD  fluticasone  (FLONASE ) 50 MCG/ACT nasal spray Place 2 sprays into both nostrils daily as needed for allergies or rhinitis.    [provider]  levalbuterol  (XOPENEX  HFA) 45 MCG/ACT inhaler Inhale two puffs every 4-6 hours if needed for cough or wheeze 08/06/23   Kozlow, Camellia PARAS, MD  mesalamine  (LIALDA ) 1.2 G EC tablet Take 1.2 g by mouth daily with breakfast.     [provider]  Multiple Vitamins-Iron  (MULTIVITAMINS WITH IRON ) TABS Take 1 tablet by mouth daily.    [provider]  omeprazole  (PRILOSEC) 40 MG capsule Take 1 capsule (40 mg total) by mouth daily. Take 1 capsule by mouth 1-2 times a day 08/06/23   Kozlow, Eric J, MD  predniSONE  (DELTASONE ) 10 MG tablet Take 1 tablet (10 mg total) by mouth daily with breakfast. Patient not taking: Reported on 10/02/2023 09/05/23   Kozlow, Eric J, MD  RECLAST  5 MG/100ML SOLN injection  03/20/22   [provider]  RSV vaccine recomb adjuvanted (AREXVY ) 120 MCG/0.5ML injection Inject into the muscle. Patient not taking: Reported on 10/02/2023 09/10/22     sodium chloride  (OCEAN) 0.65 % nasal spray Place 1 spray into the nose daily as needed for congestion.     [provider]  TESSALON  PERLES  100 MG capsule Take 1 capsule (100 mg total) by mouth 3 (three) times daily as needed for cough. 09/05/23   Kozlow, Eric J, MD  tretinoin (RETIN-A) 0.025 % cream Apply 1 Application topically at bedtime. 03/20/22   [provider]  triamcinolone cream (KENALOG) 0.1 % Apply 1 Application topically 4 (four) times daily. 03/20/22   [provider]  vitamin E  400 UNIT capsule Take 400 Units by mouth daily.    [provider]      Allergies    Penicillins, Sulfa antibiotics, and Albuterol    Review of Systems   Review of Systems  Constitutional: Negative.   HENT:   Positive for voice change.   Eyes: Negative.   Respiratory: Negative.    Cardiovascular: Negative.   Gastrointestinal: Negative.   Endocrine: Negative.   Genitourinary: Negative.    Physical Exam Updated Vital Signs BP 118/74   Pulse 73   Temp 98.1 F (36.7 C)   Resp 11   LMP 02/13/2012   SpO2 94%  Physical Exam Constitutional:      Appearance: Normal appearance.  Neck:     Comments: Non-tender over spinous processes, sharp neck pain with twisting Cardiovascular:     Rate and Rhythm: Normal rate and regular rhythm.  Pulmonary:     Effort: Pulmonary effort is normal.     Breath sounds: Normal breath sounds.  Abdominal:     General: Abdomen is flat.     Palpations: Abdomen is soft.  Musculoskeletal:        General: Tenderness present.     Cervical back: Tenderness present.     Comments: Tender to palpation over left posterior shoulder, left posterior flank, inferior thoracic and superior lumbar spine  Neurological:     General: No focal deficit present.     Mental Status: She is alert and oriented to person, place, and time.    ED Results / Procedures / Treatments   Labs (all labs ordered are listed, but only abnormal results are displayed) Labs Reviewed  BASIC METABOLIC PANEL - Abnormal; Notable for the following components:      Result Value   Glucose, Bld 123 (*)    All other components within normal limits  CBC - Abnormal; Notable for the following components:   WBC 11.1 (*)    All other components within normal limits  LIPASE, BLOOD   EKG EKG Interpretation Date/Time:  Monday November 11 2023 10:09:36 EST Ventricular Rate:  71 PR Interval:  150 QRS Duration:  88 QT Interval:  400 QTC Calculation: 435 R Axis:   35  Text Interpretation: Sinus rhythm no wpw, brugada or prolonged qt No significant change since last tracing Confirmed by Emil Share [phone]) on 11/11/2023 10:38:45 AM  Radiology CT CHEST ABDOMEN PELVIS W CONTRAST Result Date:  11/11/2023 CLINICAL DATA:  Trauma.  Mechanical fall.  Chest wall pain. EXAM: CT CHEST, ABDOMEN, AND PELVIS WITH CONTRAST TECHNIQUE: Multidetector CT imaging of the chest, abdomen and pelvis was performed following the standard protocol during bolus administration of intravenous contrast. RADIATION DOSE REDUCTION: This exam was performed according to the departmental dose-optimization program which includes automated exposure control, adjustment of the mA and/or kV according to patient size and/or use of iterative reconstruction technique. CONTRAST:  80mL OMNIPAQUE  IOHEXOL  300 MG/ML  SOLN COMPARISON:  None Available. FINDINGS: CT CHEST FINDINGS Cardiovascular: There is no cardiomegaly or pericardial effusion. Mild atherosclerotic calcification of the thoracic aorta. No aneurysmal dilatation or dissection. The origins of the great  vessels of the aortic arch and the central pulmonary arteries appear patent. Mediastinum/Nodes: No hilar or mediastinal adenopathy. The esophagus is grossly unremarkable. No mediastinal fluid collection. Lungs/Pleura: Trace left pleural effusion. There are bibasilar subpleural atelectasis. No focal consolidation or pneumothorax. The central airways are patent. Musculoskeletal: Displaced fracture of the posterior left fourth rib. Nondisplaced fracture of the posterior left fifth rib and mildly displaced fractures of the lateral fifth rib. Displaced fractures of the lateral 6-9th ribs. CT ABDOMEN PELVIS FINDINGS No intra-abdominal free air or free fluid. Hepatobiliary: Indeterminate 2 cm hypodense lesion in the right lobe of the liver may represent an area of scarring or a cyst. A neoplastic process or metastatic disease is not excluded. Additional smaller hypodense lesion in the dome of the liver is also not evaluated on this CT. No biliary dilatation. The gallbladder is unremarkable. Pancreas: There is a 1.6 x 3.6 cm hypoenhancing lesion in the body of the pancreas most consistent with  malignancy. There is atrophy of the tail of the pancreas. No dilatation of the main pancreatic duct. No active inflammatory changes. Spleen: Normal in size without focal abnormality. Adrenals/Urinary Tract: The adrenal glands unremarkable. The kidneys, visualized ureters, and urinary bladder appear unremarkable. Stomach/Bowel: There is no bowel obstruction or active inflammation. The appendix is normal. Vascular/Lymphatic: Mild aortoiliac atherosclerotic disease. The IVC is unremarkable. No portal venous gas. There is no adenopathy. Reproductive: The uterus is anteverted and grossly unremarkable. No adnexal masses. Other: Small fat containing umbilical hernia. Musculoskeletal: Degenerative changes of the lower lumbar spine. No acute osseous pathology. IMPRESSION: 1. Displaced fractures of the left fourth through ninth ribs. No pneumothorax. 2. 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 Further evaluation with MRI without and with contrast is recommended. 3. Indeterminate hypodense lesions in the liver. Attention on MRI recommended. 4. No bowel obstruction. Normal appendix. 5.  Aortic Atherosclerosis (ICD10-I70.0). Electronically Signed   By: Vanetta Chou M.D.   On: 11/11/2023 11:28   CT Head Wo Contrast Result Date: 11/11/2023 CLINICAL DATA:  Neck trauma (Age >= 65y) Fall down stairs, hit head, neck pain with movement; Head trauma, minor (Age >= 65y) Fall down four stairs, hit head, possible LOC. Hx of chiari malformation s/p surgical repair EXAM: CT HEAD WITHOUT CONTRAST CT CERVICAL SPINE WITHOUT CONTRAST TECHNIQUE: Multidetector CT imaging of the head and cervical spine was performed following the standard protocol without intravenous contrast. Multiplanar CT image reconstructions of the cervical spine were also generated. RADIATION DOSE REDUCTION: This exam was performed according to the departmental dose-optimization program which includes automated exposure control, adjustment of the  mA and/or kV according to patient size and/or use of iterative reconstruction technique. COMPARISON:  MRI head January 05, 2012. FINDINGS: CT HEAD FINDINGS Brain: Posterior decompression for Chiari malformation without substantial crowding at the foramen magnum. No evidence of acute large vascular territory infarct, acute hemorrhage, mass lesion or midline shift. Vascular: No hyperdense vessel. Skull: No acute fracture. Sinuses/Orbits: Clear sinuses.  No acute orbital findings. Other: No mastoid effusions. CT CERVICAL SPINE FINDINGS Alignment: Straightening. Mild anterolisthesis of C4 on C5, likely degenerative. Otherwise, no substantial sagittal subluxation. Skull base and vertebrae: No acute fracture. Vertebral body heights are maintained. Soft tissues and spinal canal: No prevertebral fluid or swelling. No visible canal hematoma. Disc levels: Moderate to severe multilevel degenerative change, greatest at C3-C4 and C5-C6. Upper chest: Visualized lung apices are clear. IMPRESSION: 1. No evidence of acute abnormality intracranially or in the cervical spine. 2. Posterior decompression for Chiari malformation  without substantial crowding at the foramen magnum. Electronically Signed   By: Gilmore GORMAN Molt M.D.   On: 11/11/2023 11:11   CT Cervical Spine Wo Contrast Result Date: 11/11/2023 CLINICAL DATA:  Neck trauma (Age >= 65y) Fall down stairs, hit head, neck pain with movement; Head trauma, minor (Age >= 65y) Fall down four stairs, hit head, possible LOC. Hx of chiari malformation s/p surgical repair EXAM: CT HEAD WITHOUT CONTRAST CT CERVICAL SPINE WITHOUT CONTRAST TECHNIQUE: Multidetector CT imaging of the head and cervical spine was performed following the standard protocol without intravenous contrast. Multiplanar CT image reconstructions of the cervical spine were also generated. RADIATION DOSE REDUCTION: This exam was performed according to the departmental dose-optimization program which includes automated  exposure control, adjustment of the mA and/or kV according to patient size and/or use of iterative reconstruction technique. COMPARISON:  MRI head January 05, 2012. FINDINGS: CT HEAD FINDINGS Brain: Posterior decompression for Chiari malformation without substantial crowding at the foramen magnum. No evidence of acute large vascular territory infarct, acute hemorrhage, mass lesion or midline shift. Vascular: No hyperdense vessel. Skull: No acute fracture. Sinuses/Orbits: Clear sinuses.  No acute orbital findings. Other: No mastoid effusions. CT CERVICAL SPINE FINDINGS Alignment: Straightening. Mild anterolisthesis of C4 on C5, likely degenerative. Otherwise, no substantial sagittal subluxation. Skull base and vertebrae: No acute fracture. Vertebral body heights are maintained. Soft tissues and spinal canal: No prevertebral fluid or swelling. No visible canal hematoma. Disc levels: Moderate to severe multilevel degenerative change, greatest at C3-C4 and C5-C6. Upper chest: Visualized lung apices are clear. IMPRESSION: 1. No evidence of acute abnormality intracranially or in the cervical spine. 2. Posterior decompression for Chiari malformation without substantial crowding at the foramen magnum. Electronically Signed   By: Gilmore GORMAN Molt M.D.   On: 11/11/2023 11:11    Medications Ordered in ED Medications  acetaminophen  (TYLENOL ) tablet 1,000 mg (has no administration in time range)  oxyCODONE  (Oxy IR/ROXICODONE ) immediate release tablet 2.5-5 mg (has no administration in time range)  morphine  (PF) 2 MG/ML injection 2 mg (has no administration in time range)  methocarbamol  (ROBAXIN ) tablet 500 mg (has no administration in time range)    Or  methocarbamol  (ROBAXIN ) injection 500 mg (has no administration in time range)  docusate sodium  (COLACE) capsule 100 mg (has no administration in time range)  polyethylene glycol (MIRALAX  / GLYCOLAX ) packet 17 g (has no administration in time range)  ondansetron   (ZOFRAN -ODT) disintegrating tablet 4 mg (has no administration in time range)    Or  ondansetron  (ZOFRAN ) injection 4 mg (has no administration in time range)  metoprolol  tartrate (LOPRESSOR ) injection 5 mg (has no administration in time range)  hydrALAZINE  (APRESOLINE ) injection 10 mg (has no administration in time range)  enoxaparin  (LOVENOX ) injection 30 mg (has no administration in time range)  morphine  (PF) 4 MG/ML injection 4 mg (4 mg Intravenous Given 11/11/23 0950)  acetaminophen  (TYLENOL ) tablet 1,000 mg (1,000 mg Oral Given 11/11/23 0950)  ondansetron  (ZOFRAN ) injection 4 mg (4 mg Intravenous Given 11/11/23 0950)  iohexol  (OMNIPAQUE ) 300 MG/ML solution 100 mL (80 mLs Intravenous Contrast Given 11/11/23 1038)   ED Course/ Medical Decision Making/ A&P                                 Medical Decision Making Amount and/or Complexity of Data Reviewed Labs: ordered. Radiology: ordered.  Risk OTC drugs. Prescription drug management. Decision regarding hospitalization.   Given patient's  above history, fall was likely mechanical vs orthostatic. Less suspicion for cardiogenic causes. She was placed on cardiac monitoring in the ED. EKG was ordered, which I independently reviewed: normal sinus rhythm, no ischemic changes, no brugada or WPW, no heart blocks. Gave 1g tylenol , 4 mg morphine  and zofran  with some improvement of her symptoms.   CBC with very mild leukocytosis, likely inflammatory. No anemia, normal platelets. BMP with normal electrolytes, glucose, kidney function.   Ordered a CT non-contrast head and C-spine, CT chest, abdomen, pelvis with contrast.  CT head and C-spine showed no acute intracranial abnormalities or fractures.   CT chest/abdomen/pelvis showed rib fractures in the left 4th-9th ribs. Incidentally, it also showed hypodense lesions in the liver and pancreas, concerning for malignancy. She will need an MRI for further characterization.   Given her severe pain from  her five left-sided rib fractures which is precluding her from ambulating, I feel she will benefit from admission. I have consulted and spoken to the trauma service who have agreed to admit her.   Final Clinical Impression(s) / ED Diagnoses Final diagnoses:  Closed traumatic displaced fracture of five ribs of left side, initial encounter   Rx / DC Orders ED Discharge Orders     None         Gregary Sharper, MD 11/11/23 1248    Emil Share, DO 11/11/23 1311

## 2023-11-11 NOTE — ED Notes (Signed)
Cindy Arias with cl called for transport

## 2023-11-11 NOTE — ED Notes (Signed)
 RT Note: Patient had an IS set up. Out of 1500, she could do a 750 with minimal pain from rib fractures. Patient stated she would keep working with it and trying to increase the volume.

## 2023-11-11 NOTE — H&P (Signed)
 Admitting Physician: Deward PARAS Ayala Ribble  Service: Trauma Surgery  CC: Fall  Subjective   Mechanism of Injury: Cindy Arias is an 78 y.o. female who presented as a trauma transfer after a fall.  She fell down 4 steps.  She fell last night, woke up this morning in even more pain so she presented to [facility] and was found to have rib fractures and possible pancreatic mass on CT so she was transferred to the trauma service at [facility]. She also has a sore neck and a knot on her head  Past Medical History:  Diagnosis Date   Arthritis    bone on bone'  left knee   Asthma    LAST FLARE UP 3 YRS AGO   Breast cancer (HCC)    RIGHT BREAST   Colitis    Dysphagia    Dyspnea    NONE IN 3 YRS   Esophageal stricture    GERD (gastroesophageal reflux disease)    History of infertility    History of radiation therapy 12/31/16- 01/21/17   Right Breast 42.56 Gy in 16 fractions   Osteopenia    Pneumonia    Rosacea    Ulcerative colitis (HCC)     Past Surgical History:  Procedure Laterality Date   BREAST LUMPECTOMY WITH RADIOACTIVE SEED AND SENTINEL LYMPH NODE BIOPSY Right 11/21/2016   Procedure: BREAST LUMPECTOMY WITH RADIOACTIVE SEED AND SENTINEL LYMPH NODE BIOPSY;  Surgeon: Morene Olives, MD;  Location: [facility];  Service: General;  Laterality: Right;   BREAST SURGERY     RIGHT BREAST LUMPECTOMY   BUNIONECTOMY WITH HAMMERTOE RECONSTRUCTION Left 07/2014   CATARACT EXTRACTION Right 10/2015   ESOPHAGEAL DILATION     EYE SURGERY     OD   JOINT REPLACEMENT     Repair of Chiari Malformation  02/05/13   SUBOCCIPITAL CRANIECTOMY CERVICAL LAMINECTOMY  02/13/2012   Procedure: SUBOCCIPITAL CRANIECTOMY CERVICAL LAMINECTOMY/DURAPLASTY;  Surgeon: Reyes JONETTA Budge, MD;  Location: [facility];  Service: Neurosurgery;  Laterality: N/A;  Suboccipital craniectomy with upper cervical laminectomy and duraplasty   TOTAL KNEE ARTHROPLASTY Left 11/04/2018   TOTAL  KNEE ARTHROPLASTY Left 11/04/2018   Procedure: LEFT TOTAL KNEE ARTHROPLASTY;  Surgeon: Anderson Maude ORN, MD;  Location: [facility];  Service: Orthopedics;  Laterality: Left;   TUBAL LIGATION  1987    Family History  Problem Relation Age of Onset   Cancer Mother        Liver- 2003 Lymphoma   Rheum arthritis Mother    Stroke Father    Diabetes Maternal Grandfather        Type 2    Heart disease Maternal Grandfather    Hypertension Maternal Grandmother    Heart disease Maternal Grandmother    Osteoporosis Maternal Grandmother    Anesthesia problems Neg Hx     Social:  reports that she has never smoked. She has never used smokeless tobacco. She reports current alcohol use. She reports that she does not use drugs.  Allergies:  Allergies  Allergen Reactions   Penicillins Hives and Other (See Comments)    UNSPECIFIED CHILDHOOD REACTION  Had reaction as a child and as an adult. Had additional reaction but not clear on the specifics   Sulfa Antibiotics Hives   Albuterol Other (See Comments)    UNSPECIFIED REACTION  States she cannot tolerate albuterol without side effects    Medications: Current Outpatient Medications  Medication Instructions   budesonide -formoterol  (SYMBICORT ) 160-4.5 MCG/ACT inhaler 2  puffs, Inhalation, 2 times daily   CALCIUM CITRATE-VITAMIN D  PO 1 tablet, Oral, Daily   fluticasone  (FLONASE ) 50 MCG/ACT nasal spray 2 sprays, Each Nare, Daily PRN   levalbuterol  (XOPENEX  HFA) 45 MCG/ACT inhaler 2 puffs, Inhalation, See admin instructions, Every 4-6 hours prn for sob, cough   mesalamine  (LIALDA ) 1.2 g, Oral, Daily with breakfast   Multiple Vitamins-Iron  (MULTIVITAMINS WITH IRON ) TABS 1 tablet, Oral, Daily   omeprazole  (PRILOSEC) 40 mg, Oral, Daily, Take 1 capsule by mouth 1-2 times a day   vitamin E  400 Units, Oral, Daily    Objective   Primary Survey: Blood pressure (!) 106/49, pulse 71, temperature 98.1 F (36.7 C), resp. rate 18, last menstrual period  02/13/2012, SpO2 95%. Airway: Patent, protecting airway Breathing: Bilateral breath sounds, breathing spontaneously Circulation: Stable, Palpable peripheral pulses Disability: Moving all extremities,   GCS Eyes: 4 - Eyes open spontaneously  GCS Verbal: 5 - Oriented  GCS Motor: 6 - Obeys commands for movement  GCS 15 Environment/Exposure: Warm, dry  Secondary Survey: Head:  Cephalohematoma Neck: Full range of motion without pain, no midline tenderness Chest: Bilateral breath sounds, chest wall stable, right chest tenderness Abdomen: Soft, non-tender, non-distended Upper Extremities: Strength and sensation intact, palpable peripheral pulses Lower extremities: Strength and sensation intact, palpable peripheral pulses Back: No step offs or deformities, atraumatic Rectal: Deferred Psych: Normal mood and affect  Results for orders placed or performed during the hospital encounter of 11/11/23 (from the past 24 hours)  Basic metabolic panel     Status: Abnormal   Collection Time: 11/11/23  9:36 AM  Result Value Ref Range   Sodium 139 135 - 145 mmol/L   Potassium 4.1 3.5 - 5.1 mmol/L   Chloride 105 98 - 111 mmol/L   CO2 26 22 - 32 mmol/L   Glucose, Bld 123 (H) 70 - 99 mg/dL   BUN 15 8 - 23 mg/dL   Creatinine, Ser 9.30 0.44 - 1.00 mg/dL   Calcium 9.2 8.9 - 89.6 mg/dL   GFR, Estimated >39 >39 mL/min   Anion gap 8 5 - 15  Lipase, blood     Status: None   Collection Time: 11/11/23  9:36 AM  Result Value Ref Range   Lipase 29 11 - 51 U/L  CBC     Status: Abnormal   Collection Time: 11/11/23  9:36 AM  Result Value Ref Range   WBC 11.1 (H) 4.0 - 10.5 K/uL   RBC 4.60 3.87 - 5.11 MIL/uL   Hemoglobin 13.3 12.0 - 15.0 g/dL   HCT 59.7 63.9 - 53.9 %   MCV 87.4 80.0 - 100.0 fL   MCH 28.9 26.0 - 34.0 pg   MCHC 33.1 30.0 - 36.0 g/dL   RDW 86.5 88.4 - 84.4 %   Platelets 240 150 - 400 K/uL   nRBC 0.0 0.0 - 0.2 %     Imaging Orders         CT Head Wo Contrast         CT Cervical Spine  Wo Contrast         CT CHEST ABDOMEN PELVIS W CONTRAST    Displaced fractures of the left fourth through ninth ribs. No pneumothorax. 2. 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 Further evaluation with MRI without and with contrast is recommended. 3. Indeterminate hypodense lesions in the liver. Attention on MRI recommended. 4. No bowel obstruction. Normal appendix. 5.  Aortic Atherosclerosis (ICD10-I70.0).  Assessment and Plan   Cindy Arias is an 78 y.o. female who presented as a trauma transfer after a fall.   Injuries: Left 4-9 Rib fractures - Pain control, pulmonary toilet Cephalohematoma - Local care   Incidental pancreatic mass and liver lesion on CT - f/u with MRI evaluation   Deward JINNY Foy, MD  [facility] Use [email or web address] to contact on call provider  New Patient Billing: [phone] - Moderate MDM

## 2023-11-11 NOTE — ED Triage Notes (Signed)
 Pt bib [facility], c/o mechanical fall yesterday. Reports hitting head, endorses +loc. Denies thinners. Also endorses middle and LT side lower back pain.

## 2023-11-12 ENCOUNTER — Inpatient Hospital Stay (HOSPITAL_COMMUNITY): Payer: Medicare Other

## 2023-11-12 DIAGNOSIS — K8689 Other specified diseases of pancreas: Secondary | ICD-10-CM | POA: Diagnosis not present

## 2023-11-12 DIAGNOSIS — S2242XA Multiple fractures of ribs, left side, initial encounter for closed fracture: Secondary | ICD-10-CM

## 2023-11-12 LAB — BASIC METABOLIC PANEL
Anion gap: 7 (ref 5–15)
BUN: 12 mg/dL (ref 8–23)
CO2: 26 mmol/L (ref 22–32)
Calcium: 8.6 mg/dL — ABNORMAL LOW (ref 8.9–10.3)
Chloride: 106 mmol/L (ref 98–111)
Creatinine, Ser: 0.78 mg/dL (ref 0.44–1.00)
GFR, Estimated: >60 mL/min (ref >60)
Glucose, Bld: 114 mg/dL — ABNORMAL HIGH (ref 70–99)
Potassium: 4.3 mmol/L (ref 3.5–5.1)
Sodium: 139 mmol/L (ref 135–145)

## 2023-11-12 LAB — CBC
HCT: 37 % (ref 36.0–46.0)
Hemoglobin: 12.2 g/dL (ref 12.0–15.0)
MCH: 29.1 pg (ref 26.0–34.0)
MCHC: 33 g/dL (ref 30.0–36.0)
MCV: 88.3 fL (ref 80.0–100.0)
Platelets: 218 10*3/uL (ref 150–400)
RBC: 4.19 MIL/uL (ref 3.87–5.11)
RDW: 13.7 % (ref 11.5–15.5)
WBC: 7.8 10*3/uL (ref 4.0–10.5)
nRBC: 0 % (ref 0.0–0.2)

## 2023-11-12 MED ORDER — PANTOPRAZOLE SODIUM 40 MG PO TBEC
40.0000 mg | DELAYED_RELEASE_TABLET | Freq: Every day | ORAL | Status: DC
  Administered 2023-11-12 – 2023-11-13 (×2): 40 mg via ORAL
  Filled 2023-11-12 (×2): qty 1

## 2023-11-12 MED ORDER — POLYETHYLENE GLYCOL 3350 17 G PO PACK
17.0000 g | PACK | Freq: Two times a day (BID) | ORAL | Status: DC
  Filled 2023-11-12 (×2): qty 1

## 2023-11-12 MED ORDER — OXYCODONE HCL 5 MG PO TABS
5.0000 mg | ORAL_TABLET | ORAL | Status: DC | PRN
  Administered 2023-11-12: 5 mg via ORAL
  Administered 2023-11-13: 7.5 mg via ORAL
  Administered 2023-11-13: 5 mg via ORAL
  Filled 2023-11-12: qty 2
  Filled 2023-11-12 (×2): qty 1

## 2023-11-12 MED ORDER — LIDOCAINE 5 % EX PTCH
1.0000 | MEDICATED_PATCH | CUTANEOUS | Status: DC
  Administered 2023-11-12 – 2023-11-13 (×2): 1 via TRANSDERMAL
  Filled 2023-11-12 (×2): qty 1

## 2023-11-12 MED ORDER — METHOCARBAMOL 750 MG PO TABS
750.0000 mg | ORAL_TABLET | Freq: Three times a day (TID) | ORAL | Status: DC
  Administered 2023-11-12 – 2023-11-13 (×3): 750 mg via ORAL
  Filled 2023-11-12 (×3): qty 1

## 2023-11-12 MED ORDER — MESALAMINE 1.2 G PO TBEC
1.2000 g | DELAYED_RELEASE_TABLET | Freq: Every day | ORAL | Status: DC
  Administered 2023-11-13: 1.2 g via ORAL
  Filled 2023-11-12: qty 1

## 2023-11-12 MED ORDER — GADOBUTROL 1 MMOL/ML IV SOLN
5.0000 mL | Freq: Once | INTRAVENOUS | Status: AC | PRN
  Administered 2023-11-12: 5 mL via INTRAVENOUS

## 2023-11-12 MED ORDER — LEVALBUTEROL TARTRATE 45 MCG/ACT IN AERO: 1.0000 | INHALATION_SPRAY | Freq: Three times a day (TID) | RESPIRATORY_TRACT | Status: DC | PRN

## 2023-11-12 NOTE — Progress Notes (Signed)
 Subjective: CC: Patients husband a neighbor are at bedside.  She reports left rib pain. Pain medication is helping but doesn't feel her pain is well controlled. Her pain is worse with movement, especially when being moved to the MRI today. No sob. On RA. Pulling 750 on IS. Also having left shoulder pain but able to range shoulder. Reports baseline R shoulder pain/weakness 2/2 RCT that is c/w MRI on 06/17/23. No other areas of pain. Tolerating diet without n/v. Voiding without issues. No BM. Has been oob to bedside commode.   Lives at home with her husband. She does not think he would be able to physically help at d/c. She is looking into what assistance she can obtain from family/friends. She does have a RW and other equipment at home but does not use them at baseline. She has 2 STE her townhome. Her living space is downstairs and she has a chair lift if needed to go upstairs.   She drinks 1 glass of wine week. No tobacco use ever.   Objective: Vital signs in last 24 hours: Temp:  [98.1 F (36.7 C)-98.4 F (36.9 C)] 98.1 F (36.7 C) (01/14 0547) Pulse Rate:  [65-83] 65 (01/14 0547) Resp:  [11-18] 17 (01/14 0547) BP: (106-147)/(49-76) 114/56 (01/14 0547) SpO2:  [94 %-100 %] 96 % (01/14 0547) Last BM Date : 11/10/23  Intake/Output from previous day: No intake/output data recorded. Intake/Output this shift: No intake/output data recorded.  PE: Gen:  Alert, NAD, pleasant HEENT: EOM's intact, pupils equal and round Neck: No C-spine ttp or step offs Card:  RRR. Radial and DP pulse 2+ Pulm:  CTAB, no W/R/R, effort normal. On RA. Pulling 750 on IS. L chest wall ttp.  Abd: Soft, ND, NT, +BS Psych: A&Ox4 Skin: Warm and dry. R knee bruise that she says is old and from before the fall  Neuro: Non-focal. MAE's. SILT to BUE and BLE. CN 3-12 grossly intact Msk:  RUE: No gross deformities of joints or skin unless otherwise mentioned above. Able passive/active shoulder, elbow, wrist and  digits range of motion without pain. She reports baseline decreased rom of the R shoulder 2/2 known RTC injury. No tenderness over clavicle, shoulder, upper arm, elbow, forearm, wrists or hand. Radial 2+.  LUE: No gross deformities of joints or skin unless otherwise mentioned above. Able passive/active elbow, wrist and hand range of motion without pain. She is able to raise the left shoulder but notes pain along the shoulder. Associated ttp over the anterior shoulder. No obvious ttp over the clavicle. No tenderness over elbow, forearm, wrists or hand. Radial 2+.  RLE: No gross deformities of joints or skin unless otherwise mentioned above. No sacral crepitus.  Negative logroll test. Able  passive/active range of motion of hip, knee and ankle without pain.  No tenderness over hip, upper legs, knee, lower leg, ankle or foot.  No lower extremity edema.  DP 2+  LLE: No gross deformities of joints or skin unless otherwise mentioned above. No sacral crepitus.  Negative logroll test. Able  passive/active range of motion of hip, knee and ankle without pain.  No tenderness over hip, upper legs, knee, lower leg, ankle or feet.  No lower extremity edema.  DP 2+.   Lab Results:  Recent Labs    11/11/23 0936 11/12/23 0605  WBC 11.1* 7.8  HGB 13.3 12.2  HCT 40.2 37.0  PLT 240 218   BMET Recent Labs    11/11/23 0936 11/12/23  0605  NA 139 139  K 4.1 4.3  CL 105 106  CO2 26 26  GLUCOSE 123* 114*  BUN 15 12  CREATININE 0.69 0.78  CALCIUM 9.2 8.6*   PT/INR No results for input(s): LABPROT, INR in the last 72 hours. CMP     Component Value Date/Time   NA 139 11/12/2023 0605   NA 140 04/29/2017 1202   K 4.3 11/12/2023 0605   K 4.2 04/29/2017 1202   CL 106 11/12/2023 0605   CO2 26 11/12/2023 0605   CO2 25 04/29/2017 1202   GLUCOSE 114 (H) 11/12/2023 0605   GLUCOSE 97 04/29/2017 1202   BUN 12 11/12/2023 0605   BUN 19.2 04/29/2017 1202   CREATININE 0.78 11/12/2023 0605   CREATININE 0.74  08/29/2021 1007   CREATININE 0.8 04/29/2017 1202   CALCIUM 8.6 (L) 11/12/2023 0605   CALCIUM 9.8 04/29/2017 1202   PROT 7.0 08/29/2021 1007   PROT 7.3 04/29/2017 1202   ALBUMIN 4.1 08/29/2021 1007   ALBUMIN 4.1 04/29/2017 1202   AST 24 08/29/2021 1007   AST 21 04/29/2017 1202   ALT 21 08/29/2021 1007   ALT 20 04/29/2017 1202   ALKPHOS 67 08/29/2021 1007   ALKPHOS 80 04/29/2017 1202   BILITOT 0.5 08/29/2021 1007   BILITOT 0.56 04/29/2017 1202   GFRNONAA >60 11/12/2023 0605   GFRNONAA >60 08/29/2021 1007   GFRAA >60 08/25/2019 1004   Lipase     Component Value Date/Time   LIPASE 29 11/11/2023 0936    Studies/Results: MR ABDOMEN W WO CONTRAST Result Date: 11/12/2023 CLINICAL DATA:  Pancreatic mass on CT EXAM: MRI ABDOMEN WITHOUT AND WITH CONTRAST (INCLUDING MRCP) TECHNIQUE: Multiplanar multisequence MR imaging of the abdomen was performed both before and after the administration of intravenous contrast. Heavily T2-weighted images of the biliary and pancreatic ducts were obtained, and three-dimensional MRCP images were rendered by post processing. CONTRAST:  5mL GADAVIST  GADOBUTROL  1 MMOL/ML IV SOLN COMPARISON:  CT abdomen/pelvis dated 11/11/2023 FINDINGS: Lower chest: Trace bilateral pleural effusions with bibasilar atelectasis. Hepatobiliary: 13 mm rim enhancing lesion along the posterior aspect of segment 4A and 2 (series 14/image 21). Additional 12 mm rim enhancing lesion in segment 4A (series 14/image 26). Subtle 8 mm rim enhancing lesion in segment 7 (series 14/image 30). Additional foci of suspected restricted diffusion (series 6/images 82 and 89). In this clinical context, these are suspicious for metastases. Additional 2.3 cm irregular cyst in segment 8 (series 14/image 30). Gallbladder is unremarkable. No intrahepatic or extrahepatic ductal dilatation. Pancreas: 3.5 x 1.6 cm hypoenhancing mass in the pancreatic tail (series 14/image 47), highly suspicious for pancreatic  adenocarcinoma. No associated vascular invasion. Spleen:  Within normal limits. Adrenals/Urinary Tract:  Adrenal glands are within normal limits. Kidneys are within normal limits.  No hydronephrosis. Stomach/Bowel: Stomach is within normal limits. Visualized bowel is grossly unremarkable. Vascular/Lymphatic:  No evidence of abdominal aortic aneurysm. No suspicious abdominal lymphadenopathy. Other:  No abdominal ascites. Musculoskeletal: No focal osseous lesions. IMPRESSION: 3.5 cm mass in the pancreatic tail, highly suspicious for pancreatic adenocarcinoma. No associated vascular invasion. Multifocal small hepatic metastases, as above. Trace bilateral pleural effusions with bibasilar atelectasis. Electronically Signed   By: Pinkie Pebbles M.D.   On: 11/12/2023 04:01   MR 3D Recon At Scanner Result Date: 11/12/2023 CLINICAL DATA:  Pancreatic mass on CT EXAM: MRI ABDOMEN WITHOUT AND WITH CONTRAST (INCLUDING MRCP) TECHNIQUE: Multiplanar multisequence MR imaging of the abdomen was performed both before and after the administration of  intravenous contrast. Heavily T2-weighted images of the biliary and pancreatic ducts were obtained, and three-dimensional MRCP images were rendered by post processing. CONTRAST:  5mL GADAVIST  GADOBUTROL  1 MMOL/ML IV SOLN COMPARISON:  CT abdomen/pelvis dated 11/11/2023 FINDINGS: Lower chest: Trace bilateral pleural effusions with bibasilar atelectasis. Hepatobiliary: 13 mm rim enhancing lesion along the posterior aspect of segment 4A and 2 (series 14/image 21). Additional 12 mm rim enhancing lesion in segment 4A (series 14/image 26). Subtle 8 mm rim enhancing lesion in segment 7 (series 14/image 30). Additional foci of suspected restricted diffusion (series 6/images 82 and 89). In this clinical context, these are suspicious for metastases. Additional 2.3 cm irregular cyst in segment 8 (series 14/image 30). Gallbladder is unremarkable. No intrahepatic or extrahepatic ductal dilatation.  Pancreas: 3.5 x 1.6 cm hypoenhancing mass in the pancreatic tail (series 14/image 47), highly suspicious for pancreatic adenocarcinoma. No associated vascular invasion. Spleen:  Within normal limits. Adrenals/Urinary Tract:  Adrenal glands are within normal limits. Kidneys are within normal limits.  No hydronephrosis. Stomach/Bowel: Stomach is within normal limits. Visualized bowel is grossly unremarkable. Vascular/Lymphatic:  No evidence of abdominal aortic aneurysm. No suspicious abdominal lymphadenopathy. Other:  No abdominal ascites. Musculoskeletal: No focal osseous lesions. IMPRESSION: 3.5 cm mass in the pancreatic tail, highly suspicious for pancreatic adenocarcinoma. No associated vascular invasion. Multifocal small hepatic metastases, as above. Trace bilateral pleural effusions with bibasilar atelectasis. Electronically Signed   By: Pinkie Pebbles M.D.   On: 11/12/2023 04:01   CT CHEST ABDOMEN PELVIS W CONTRAST Result Date: 11/11/2023 CLINICAL DATA:  Trauma.  Mechanical fall.  Chest wall pain. EXAM: CT CHEST, ABDOMEN, AND PELVIS WITH CONTRAST TECHNIQUE: Multidetector CT imaging of the chest, abdomen and pelvis was performed following the standard protocol during bolus administration of intravenous contrast. RADIATION DOSE REDUCTION: This exam was performed according to the departmental dose-optimization program which includes automated exposure control, adjustment of the mA and/or kV according to patient size and/or use of iterative reconstruction technique. CONTRAST:  80mL OMNIPAQUE  IOHEXOL  300 MG/ML  SOLN COMPARISON:  None Available. FINDINGS: CT CHEST FINDINGS Cardiovascular: There is no cardiomegaly or pericardial effusion. Mild atherosclerotic calcification of the thoracic aorta. No aneurysmal dilatation or dissection. The origins of the great vessels of the aortic arch and the central pulmonary arteries appear patent. Mediastinum/Nodes: No hilar or mediastinal adenopathy. The esophagus is  grossly unremarkable. No mediastinal fluid collection. Lungs/Pleura: Trace left pleural effusion. There are bibasilar subpleural atelectasis. No focal consolidation or pneumothorax. The central airways are patent. Musculoskeletal: Displaced fracture of the posterior left fourth rib. Nondisplaced fracture of the posterior left fifth rib and mildly displaced fractures of the lateral fifth rib. Displaced fractures of the lateral 6-9th ribs. CT ABDOMEN PELVIS FINDINGS No intra-abdominal free air or free fluid. Hepatobiliary: Indeterminate 2 cm hypodense lesion in the right lobe of the liver may represent an area of scarring or a cyst. A neoplastic process or metastatic disease is not excluded. Additional smaller hypodense lesion in the dome of the liver is also not evaluated on this CT. No biliary dilatation. The gallbladder is unremarkable. Pancreas: There is a 1.6 x 3.6 cm hypoenhancing lesion in the body of the pancreas most consistent with malignancy. There is atrophy of the tail of the pancreas. No dilatation of the main pancreatic duct. No active inflammatory changes. Spleen: Normal in size without focal abnormality. Adrenals/Urinary Tract: The adrenal glands unremarkable. The kidneys, visualized ureters, and urinary bladder appear unremarkable. Stomach/Bowel: There is no bowel obstruction or active inflammation. The appendix  is normal. Vascular/Lymphatic: Mild aortoiliac atherosclerotic disease. The IVC is unremarkable. No portal venous gas. There is no adenopathy. Reproductive: The uterus is anteverted and grossly unremarkable. No adnexal masses. Other: Small fat containing umbilical hernia. Musculoskeletal: Degenerative changes of the lower lumbar spine. No acute osseous pathology. IMPRESSION: 1. Displaced fractures of the left fourth through ninth ribs. No pneumothorax. 2. 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 Further evaluation with MRI without and with contrast is recommended. 3.  Indeterminate hypodense lesions in the liver. Attention on MRI recommended. 4. No bowel obstruction. Normal appendix. 5.  Aortic Atherosclerosis (ICD10-I70.0). Electronically Signed   By: Vanetta Chou M.D.   On: 11/11/2023 11:28   CT Head Wo Contrast Result Date: 11/11/2023 CLINICAL DATA:  Neck trauma (Age >= 65y) Fall down stairs, hit head, neck pain with movement; Head trauma, minor (Age >= 65y) Fall down four stairs, hit head, possible LOC. Hx of chiari malformation s/p surgical repair EXAM: CT HEAD WITHOUT CONTRAST CT CERVICAL SPINE WITHOUT CONTRAST TECHNIQUE: Multidetector CT imaging of the head and cervical spine was performed following the standard protocol without intravenous contrast. Multiplanar CT image reconstructions of the cervical spine were also generated. RADIATION DOSE REDUCTION: This exam was performed according to the departmental dose-optimization program which includes automated exposure control, adjustment of the mA and/or kV according to patient size and/or use of iterative reconstruction technique. COMPARISON:  MRI head January 05, 2012. FINDINGS: CT HEAD FINDINGS Brain: Posterior decompression for Chiari malformation without substantial crowding at the foramen magnum. No evidence of acute large vascular territory infarct, acute hemorrhage, mass lesion or midline shift. Vascular: No hyperdense vessel. Skull: No acute fracture. Sinuses/Orbits: Clear sinuses.  No acute orbital findings. Other: No mastoid effusions. CT CERVICAL SPINE FINDINGS Alignment: Straightening. Mild anterolisthesis of C4 on C5, likely degenerative. Otherwise, no substantial sagittal subluxation. Skull base and vertebrae: No acute fracture. Vertebral body heights are maintained. Soft tissues and spinal canal: No prevertebral fluid or swelling. No visible canal hematoma. Disc levels: Moderate to severe multilevel degenerative change, greatest at C3-C4 and C5-C6. Upper chest: Visualized lung apices are clear.  IMPRESSION: 1. No evidence of acute abnormality intracranially or in the cervical spine. 2. Posterior decompression for Chiari malformation without substantial crowding at the foramen magnum. Electronically Signed   By: Gilmore GORMAN Molt M.D.   On: 11/11/2023 11:11   CT Cervical Spine Wo Contrast Result Date: 11/11/2023 CLINICAL DATA:  Neck trauma (Age >= 65y) Fall down stairs, hit head, neck pain with movement; Head trauma, minor (Age >= 65y) Fall down four stairs, hit head, possible LOC. Hx of chiari malformation s/p surgical repair EXAM: CT HEAD WITHOUT CONTRAST CT CERVICAL SPINE WITHOUT CONTRAST TECHNIQUE: Multidetector CT imaging of the head and cervical spine was performed following the standard protocol without intravenous contrast. Multiplanar CT image reconstructions of the cervical spine were also generated. RADIATION DOSE REDUCTION: This exam was performed according to the departmental dose-optimization program which includes automated exposure control, adjustment of the mA and/or kV according to patient size and/or use of iterative reconstruction technique. COMPARISON:  MRI head January 05, 2012. FINDINGS: CT HEAD FINDINGS Brain: Posterior decompression for Chiari malformation without substantial crowding at the foramen magnum. No evidence of acute large vascular territory infarct, acute hemorrhage, mass lesion or midline shift. Vascular: No hyperdense vessel. Skull: No acute fracture. Sinuses/Orbits: Clear sinuses.  No acute orbital findings. Other: No mastoid effusions. CT CERVICAL SPINE FINDINGS Alignment: Straightening. Mild anterolisthesis of C4 on C5, likely degenerative. Otherwise, no substantial  sagittal subluxation. Skull base and vertebrae: No acute fracture. Vertebral body heights are maintained. Soft tissues and spinal canal: No prevertebral fluid or swelling. No visible canal hematoma. Disc levels: Moderate to severe multilevel degenerative change, greatest at C3-C4 and C5-C6. Upper chest:  Visualized lung apices are clear. IMPRESSION: 1. No evidence of acute abnormality intracranially or in the cervical spine. 2. Posterior decompression for Chiari malformation without substantial crowding at the foramen magnum. Electronically Signed   By: Gilmore GORMAN Molt M.D.   On: 11/11/2023 11:11    Anti-infectives: Anti-infectives (From admission, onward)    None        Assessment/Plan SHADY PADRON is an 78 y.o. female who presented as a trauma transfer after a fall down 4 steps on 1/12 Left 4-9 Rib fractures - Multimodal pain control, pulmonary toilet Cephalohematoma - Local care L shoulder pain - will get dedicated xray.  Incidental pancreatic mass and liver lesion on CT - MRI w/ 3.5 x 1.6 cm mass in the pancreatic tail, highly suspicious for pancreatic adenocarcinoma with areas suspicious for multifocal small hepatic metastases  There is no associated vascular invasion. She had a CT CAP on admission. Will add CA 19-9 and review with one of our hepatobiliary surgeons. She does have a hx of Breast Cancer s/p R lumpectomy, adj radiation and anastrozole  therapy (completed). Hx Asthma - prn levalbuterol   Hx UC - home mesalamine   Hx Chiari Malformation s/p surgical repair  FEN - Reg, SLIV VTE - SCDs, Lovenox  ID - None Foley - None, spont void.  Plan - L shoulder film. PT/OT  I reviewed nursing notes, last 24 h vitals and pain scores, last 48 h intake and output, last 24 h labs and trends, and last 24 h imaging results.   LOS: 1 day    Ozell CHRISTELLA Shaper , [facility] 11/12/2023, 7:53 AM Please see Amion for pager number during day hours 7:00am-4:30pm

## 2023-11-12 NOTE — Consult Note (Addendum)
 [facility] CONSULT NOTE  Patient Care Team: Aisha Harvey, MD as PCP - General (Family Medicine) Magrinat, Sandria BROCKS, MD (Inactive) as Consulting Physician (Oncology) Mikell Katz, MD (Inactive) as Consulting Physician (General Surgery) Izell Domino, MD as Attending Physician (Radiation Oncology) Leslee Reusing, MD as Consulting Physician (Ophthalmology) Maurilio, Camellia PARAS, MD as Consulting Physician (Allergy and Immunology) Burnette Fallow, MD as Consulting Physician (Gastroenterology) Crawford, Morna Pickle, NP as Nurse Practitioner (Hematology and Oncology) Anderson Maude ORN, MD (Inactive) as Consulting Physician (Orthopedic Surgery)  CHIEF COMPLAINTS/PURPOSE OF CONSULTATION:  Pancreatic mass  REFERRING PHYSICIAN: Trauma MD  HISTORY OF PRESENTING ILLNESS:  Cindy Arias 78 y.o. female who was brought to the ED at [facility] by EMS status post a fall at home.  Patient states she thinks she missed a step, fell and thinks she blacked out.  Reports she has porcelain floors at home and thinks she hit her head pretty hard.  She was found to have left-sided rib fractures from 4th through 9th ribs.  Therefore she was transferred to the trauma service at [facility]. Workup upon admission also included CT imaging of the abdomen and pelvis which was significant for a hypoenhancing pancreatic mass most consistent with malignancy.  MRI confirmed a 3.5 cm mass in the pancreatic tail highly suspicious for pancreatic adenocarcinoma with multifocal small hepatic metastasis.  Therefore oncology consult has been requested. Medical history includes breast cancer which patient states she was diagnosed in 2022 (per records patient was diagnosed in 2017) and was treated with lumpectomy and 5 radiation therapy treatments, denies chemotherapy.  Reports she was on anastrozole  but no longer takes it as she aged out.   Medical history also includes asthma, colitis, GERD, Chiari  malformation. Surgical history includes left total knee replacement in 2021; several bunionectomies and hammertoe surgeries. Family history significant for mother with non-Hodgkin's lymphoma with liver mets, died at age 12. Social history includes 1 glass of wine per week, denies tobacco use, denies drug use.    I have reviewed her chart and materials related to her cancer extensively and collaborated history with the patient. 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     11/21/2016 Surgery   Right breast lumpectomy ([facility]): IDC, grade 2, 1.9cm, margins negative, 2 SLN negative. 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  Pancreatic tail mass with liver lesions - Incidental finding after a fall/trauma. - Likely adenocarcinoma - CT abdomen pelvis done 11/11/2023 shows hypoenhancing pancreatic mass most consistent with malignancy.  Indeterminate hypodense lesions in the liver -.  MRI done today 11/12/2023 shows 3.5 cm mass in the pancreatic tail, highly suspicious for pancreatic adenocarcinoma - Tumor marker CA 19-9 pending. - Will need biopsy with final pathology to confirm diagnosis and further treatment planning. - Medical oncology/Dr. Lanny will follow closely  2.  History of right breast cancer (ER + PR + HER2-) -  Initially diagnosed 2017 - Status post right breast lumpectomy, adjuvant radiation  therapy, and anastrozole . - Patient was lost to follow-up after her primary oncologist retired.  3.  Trauma/status post fall Left-sided rib fractures - Fractures 4th through 9th ribs on her left side - Management per trauma and Ortho teams  4.  Other: GERD Chiari malformation Colitis Asthma -Management per medicine      Orders Placed This Encounter  Procedures   CT Head Wo Contrast    Standing Status:   Standing    Number of Occurrences:   1   CT Cervical Spine Wo Contrast    Standing Status:   Standing    Number of Occurrences:   1   CT CHEST ABDOMEN PELVIS W CONTRAST    Standing Status:   Standing    Number of Occurrences:   1    Does the patient have a contrast media/X-ray dye allergy?:   No    If indicated for the ordered procedure, I authorize the administration of oral contrast media per Radiology protocol:   Yes   MR ABDOMEN W WO CONTRAST    Standing Status:   Standing    Number of Occurrences:   1    If indicated for the ordered procedure, I authorize the administration of contrast media per Radiology protocol:   Yes    What is the patient's sedation requirement?:   No Sedation    Does the patient have a pacemaker or implanted devices?:   No   MR 3D Recon At Scanner    Standing Status:   Standing    Number of Occurrences:   1    Symptom/Reason for Exam:   Pancreatic lesion [[id number]]    What is the patient's sedation requirement?:   No Sedation    Does the patient have a pacemaker or implanted devices?:   No   DG Shoulder Left Port    Standing Status:   Standing    Number of Occurrences:   1    Symptom/Reason for Exam:   Pain [[id number]]   US  BIOPSY (LIVER)    Pancreatic mass with liver lesions    Standing Status:   Standing    Number of Occurrences:   1    Lab orders requested (DO NOT place separate lab orders, these will be automatically ordered during procedure specimen collection)::   Surgical Pathology    Can the patient sign their own consent?:   Yes     Symptom/Reason for Exam:   Liver mass [[id number]]   Basic metabolic panel    Standing Status:   Standing    Number of Occurrences:   1   Lipase, blood    Standing Status:   Standing    Number of Occurrences:   1   CBC    Standing Status:   Standing    Number of Occurrences:   1   CBC    Standing Status:   Standing    Number of Occurrences:   1   Basic metabolic panel    Standing Status:   Standing    Number of Occurrences:   1   Cancer antigen 19-9    Standing Status:   Standing    Number of Occurrences:   1   Cancer antigen 19-9    Standing Status:   Standing    Number of Occurrences:   1   Diet regular Room service appropriate? Yes; Fluid consistency: Thin    Standing Status:  Standing    Number of Occurrences:   1    Room service appropriate?:   Yes    Fluid consistency::   Thin   SCDs    Standing Status:   Standing    Number of Occurrences:   1    Laterality:   Bilateral   Cardiac monitoring    Standing Status:   Standing    Number of Occurrences:   1   Vital signs    Standing Status:   Standing    Number of Occurrences:   1   Call Trauma MD on call for:    Standing Status:   Standing    Number of Occurrences:   20    Notify Physician:   Temp greater than 101.5 F    Notify Physician:   Systolic BP greater than 180 or less than 95    Notify Physician:   Diastolic BP greater than 110 or less than 55    Notify Physician:   Heart Rate greater than 120 or less than 55    Notify Physician:   Urinary Output less than 20 ml/hr by foley or 300 ml/shift if voiding   Initiate Oral Care Protocol    Standing Status:   Standing    Number of Occurrences:   1   Initiate Carrier Fluid Protocol    Standing Status:   Standing    Number of Occurrences:   1   RN may order Trauma Service PRN Orders (through manage orders) for the following patient needs:  cough (Robitussin DM), indigestion (Maalox/simethicone), minor skin irritation (hydrocortisone cream/bacitracin  ointment), sore  throat (chloraseptic spray / Cepacol), sleep (melatonin)    Standing Status:   Standing    Number of Occurrences:   20   Full code    Standing Status:   Standing    Number of Occurrences:   1    By::   Default: patient does not have capacity for decision making, no surrogate or prior directive available   Consult to trauma surgery    Standing Status:   Standing    Number of Occurrences:   1    Place call to::   oncall Trauma Surgery    Reason for Consult:   Admit   OT eval and treat    Standing Status:   Standing    Number of Occurrences:   1   PT eval and treat    Standing Status:   Standing    Number of Occurrences:   1   Incentive spirometry    Standing Status:   Standing    Number of Occurrences:   1   ED EKG    Standing Status:   Standing    Number of Occurrences:   1    Reason for Exam:   Chest Pain   EKG 12-Lead    Standing Status:   Standing    Number of Occurrences:   1   EKG 12-Lead    Standing Status:   Standing    Number of Occurrences:   1   EKG    Standing Status:   Standing    Number of Occurrences:   1   EKG    Standing Status:   Standing    Number of Occurrences:   1   Admit to Inpatient (patient's expected length of stay will be greater than 2 midnights or inpatient only procedure)    Standing Status:   Standing    Number of  Occurrences:   1    Hospital Area:   [facility] [[id number]]    Level of Care:   Med-Surg [16]    May admit patient to [facility] or [facility] if equivalent level of care is available::   No    Interfacility transfer:   Yes    Covid Evaluation:   Asymptomatic - no recent exposure (last 10 days) testing not required    Diagnosis:   Rib fractures [[id number]]    Admitting Physician:   TRAUMA MD [[id number]]    Attending Physician:   TRAUMA MD [[id number]]    Certification::   I certify this patient will need inpatient services for at least 2 midnights    Expected Medical Readiness:   11/11/2023     MEDICAL HISTORY:  Past  Medical History:  Diagnosis Date   Arthritis    bone on bone'  left knee   Asthma    LAST FLARE UP 3 YRS AGO   Breast cancer (HCC)    RIGHT BREAST   Colitis    Dysphagia    Dyspnea    NONE IN 3 YRS   Esophageal stricture    GERD (gastroesophageal reflux disease)    History of infertility    History of radiation therapy 12/31/16- 01/21/17   Right Breast 42.56 Gy in 16 fractions   Osteopenia    Pneumonia    Rosacea    Ulcerative colitis (HCC)     SURGICAL HISTORY: Past Surgical History:  Procedure Laterality Date   BREAST LUMPECTOMY WITH RADIOACTIVE SEED AND SENTINEL LYMPH NODE BIOPSY Right 11/21/2016   Procedure: BREAST LUMPECTOMY WITH RADIOACTIVE SEED AND SENTINEL LYMPH NODE BIOPSY;  Surgeon: Morene Olives, MD;  Location: [facility];  Service: General;  Laterality: Right;   BREAST SURGERY     RIGHT BREAST LUMPECTOMY   BUNIONECTOMY WITH HAMMERTOE RECONSTRUCTION Left 07/2014   CATARACT EXTRACTION Right 10/2015   ESOPHAGEAL DILATION     EYE SURGERY     OD   JOINT REPLACEMENT     Repair of Chiari Malformation  02/05/13   SUBOCCIPITAL CRANIECTOMY CERVICAL LAMINECTOMY  02/13/2012   Procedure: SUBOCCIPITAL CRANIECTOMY CERVICAL LAMINECTOMY/DURAPLASTY;  Surgeon: Reyes JONETTA Budge, MD;  Location: [facility];  Service: Neurosurgery;  Laterality: N/A;  Suboccipital craniectomy with upper cervical laminectomy and duraplasty   TOTAL KNEE ARTHROPLASTY Left 11/04/2018   TOTAL KNEE ARTHROPLASTY Left 11/04/2018   Procedure: LEFT TOTAL KNEE ARTHROPLASTY;  Surgeon: Anderson Maude ORN, MD;  Location: [facility];  Service: Orthopedics;  Laterality: Left;   TUBAL LIGATION  1987    SOCIAL HISTORY: Social History   Socioeconomic History   Marital status: Married    Spouse name: Not on file   Number of children: Not on file   Years of education: Not on file   Highest education level: Not on file  Occupational History   Not on file  Tobacco Use   Smoking status: Never    Smokeless tobacco: Never   Tobacco comments:    she smoked for a few months in college  Vaping Use   Vaping status: Never Used  Substance and Sexual Activity   Alcohol use: Yes    Comment: 1 glass of wine monthly   Drug use: No    Comment: Smoked in college ~ 2 years   Sexual activity: Not on file  Other Topics Concern   Not on file  Social History Narrative   Not on file   Social  Drivers of Corporate Investment Banker Strain: Not on file  Food Insecurity: No Food Insecurity (11/11/2023)   Hunger Vital Sign    Worried About Running Out of Food in the Last Year: Never true    Ran Out of Food in the Last Year: Never true  Transportation Needs: No Transportation Needs (11/11/2023)   PRAPARE - Administrator, Civil Service (Medical): No    Lack of Transportation (Non-Medical): No  Physical Activity: Not on file  Stress: Not on file  Social Connections: Socially Integrated (11/11/2023)   Social Connection and Isolation Panel [NHANES]    Frequency of Communication with Friends and Family: Three times a week    Frequency of Social Gatherings with Friends and Family: Three times a week    Attends Religious Services: More than 4 times per year    Active Member of Clubs or Organizations: Yes    Attends Banker Meetings: More than 4 times per year    Marital Status: Married  Catering Manager Violence: Not At Risk (11/11/2023)   Humiliation, Afraid, Rape, and Kick questionnaire    Fear of Current or Ex-Partner: No    Emotionally Abused: No    Physically Abused: No    Sexually Abused: No    FAMILY HISTORY: Family History  Problem Relation Age of Onset   Cancer Mother        Liver- 2003 Lymphoma   Rheum arthritis Mother    Stroke Father    Diabetes Maternal Grandfather        Type 2    Heart disease Maternal Grandfather    Hypertension Maternal Grandmother    Heart disease Maternal Grandmother    Osteoporosis Maternal Grandmother    Anesthesia problems  Neg Hx     REVIEW OF SYSTEMS:   Constitutional: +Generalized pain and aches, denies fevers, chills or abnormal night sweats Eyes: Denies blurriness of vision, double vision or watery eyes Ears, nose, mouth, throat, and face: Denies mucositis or sore throat Respiratory: Denies cough, dyspnea or wheezes Cardiovascular: Denies palpitation, chest discomfort or lower extremity swelling Gastrointestinal: Denies nausea, heartburn or change in bowel habits Skin: Denies abnormal skin rashes Lymphatics: Denies new lymphadenopathy or easy bruising Neurological: Denies numbness, tingling or new weaknesses Behavioral/Psych: Mood is stable, no new changes  All other systems were reviewed with the patient and are negative.  PHYSICAL EXAMINATION: ECOG PERFORMANCE STATUS: 2 - Symptomatic, <50% confined to bed  Vitals:   11/11/23 2033 11/12/23 0547  BP: (!) 106/49 (!) 114/56  Pulse: 71 65  Resp: 18 17  Temp: 98.1 F (36.7 C) 98.1 F (36.7 C)  SpO2: 95% 96%   There were no vitals filed for this visit.  GENERAL: alert, no distress and comfortable SKIN: +Pale skin color, texture, turgor are normal, no rashes or significant lesions EYES: normal, conjunctiva are pink and non-injected, sclera clear OROPHARYNX: no exudate, no erythema and lips, buccal mucosa, and tongue normal  NECK: supple, thyroid normal size, non-tender, without nodularity LYMPH: no palpable lymphadenopathy in the cervical, axillary or inguinal LUNGS: clear to auscultation and percussion with normal breathing effort HEART: regular rate & rhythm and no murmurs and no lower extremity edema ABDOMEN: abdomen soft, non-tender and normal bowel sounds MUSCULOSKELETAL: +Ambulates with walker   PSYCH: alert & oriented x 3 with fluent speech NEURO: no focal motor/sensory deficits   ALLERGIES:  is allergic to penicillins, sulfa antibiotics, and albuterol.  MEDICATIONS:  Current Facility-Administered Medications  Medication Dose Route  Frequency Provider Last Rate Last Admin   acetaminophen  (TYLENOL ) tablet 1,000 mg  1,000 mg Oral Q6H Paola Dreama SAILOR, MD   1,000 mg at 11/12/23 1442   docusate sodium  (COLACE) capsule 100 mg  100 mg Oral BID Paola Dreama SAILOR, MD   100 mg at 11/12/23 0859   enoxaparin  (LOVENOX ) injection 30 mg  30 mg Subcutaneous Q12H Paola Dreama SAILOR, MD   30 mg at 11/12/23 9141   hydrALAZINE  (APRESOLINE ) injection 10 mg  10 mg Intravenous Q2H PRN Paola Dreama SAILOR, MD       levalbuterol  (XOPENEX  HFA) inhaler 1-2 puff  1-2 puff Inhalation Q8H PRN Maczis, Michael M, PA-C       lidocaine  (LIDODERM ) 5 % 1 patch  1 patch Transdermal Q24H Maczis, Michael M, PA-C   1 patch at 11/12/23 0858   [START ON 11/13/2023] mesalamine  (LIALDA ) EC tablet 1.2 g  1.2 g Oral Q breakfast Maczis, Michael M, PA-C       methocarbamol  (ROBAXIN ) tablet 750 mg  750 mg Oral Q8H Maczis, Michael M, PA-C   750 mg at 11/12/23 1443   metoprolol  tartrate (LOPRESSOR ) injection 5 mg  5 mg Intravenous Q6H PRN Paola Dreama SAILOR, MD       ondansetron  (ZOFRAN -ODT) disintegrating tablet 4 mg  4 mg Oral Q6H PRN Paola Dreama SAILOR, MD       Or   ondansetron  (ZOFRAN ) injection 4 mg  4 mg Intravenous Q6H PRN Paola Dreama SAILOR, MD       oxyCODONE  (Oxy IR/ROXICODONE ) immediate release tablet 5-7.5 mg  5-7.5 mg Oral Q4H PRN Maczis, Michael M, PA-C       pantoprazole  (PROTONIX ) EC tablet 40 mg  40 mg Oral Daily Maczis, Michael M, PA-C   40 mg at 11/12/23 1139   polyethylene glycol (MIRALAX  / GLYCOLAX ) packet 17 g  17 g Oral BID Maczis, Michael M, PA-C         LABORATORY DATA:  I have reviewed the data as listed Lab Results  Component Value Date   WBC 7.8 11/12/2023   HGB 12.2 11/12/2023   HCT 37.0 11/12/2023   MCV 88.3 11/12/2023   PLT 218 11/12/2023   Recent Labs    11/11/23 0936 11/12/23 0605  NA 139 139  K 4.1 4.3  CL 105 106  CO2 26 26  GLUCOSE 123* 114*  BUN 15 12  CREATININE 0.69 0.78  CALCIUM 9.2 8.6*  GFRNONAA >60 >60    RADIOGRAPHIC  STUDIES: I have personally reviewed the radiological images as listed and agreed with the findings in the report. DG Shoulder Left Port Result Date: 11/12/2023 CLINICAL DATA:  Shoulder pain after recent fall EXAM: LEFT SHOULDER COMPARISON:  CT 11/11/2023 FINDINGS: No acute fracture or malalignment. Mild AC joint degenerative change. Acute displaced left third through eighth posterolateral rib fractures. IMPRESSION: 1. No acute osseous abnormality of the left shoulder. Minimal calcific tendinopathy. 2. Acute displaced left third through eighth posterolateral rib fractures. Electronically Signed   By: Luke Bun M.D.   On: 11/12/2023 15:13   MR ABDOMEN W WO CONTRAST Result Date: 11/12/2023 CLINICAL DATA:  Pancreatic mass on CT EXAM: MRI ABDOMEN WITHOUT AND WITH CONTRAST (INCLUDING MRCP) TECHNIQUE: Multiplanar multisequence MR imaging of the abdomen was performed both before and after the administration of intravenous contrast. Heavily T2-weighted images of the biliary and pancreatic ducts were obtained, and three-dimensional MRCP images were rendered by post processing. CONTRAST:  5mL GADAVIST  GADOBUTROL  1 MMOL/ML IV SOLN COMPARISON:  CT abdomen/pelvis dated 11/11/2023 FINDINGS: Lower chest: Trace bilateral pleural effusions with bibasilar atelectasis. Hepatobiliary: 13 mm rim enhancing lesion along the posterior aspect of segment 4A and 2 (series 14/image 21). Additional 12 mm rim enhancing lesion in segment 4A (series 14/image 26). Subtle 8 mm rim enhancing lesion in segment 7 (series 14/image 30). Additional foci of suspected restricted diffusion (series 6/images 82 and 89). In this clinical context, these are suspicious for metastases. Additional 2.3 cm irregular cyst in segment 8 (series 14/image 30). Gallbladder is unremarkable. No intrahepatic or extrahepatic ductal dilatation. Pancreas: 3.5 x 1.6 cm hypoenhancing mass in the pancreatic tail (series 14/image 47), highly suspicious for pancreatic  adenocarcinoma. No associated vascular invasion. Spleen:  Within normal limits. Adrenals/Urinary Tract:  Adrenal glands are within normal limits. Kidneys are within normal limits.  No hydronephrosis. Stomach/Bowel: Stomach is within normal limits. Visualized bowel is grossly unremarkable. Vascular/Lymphatic:  No evidence of abdominal aortic aneurysm. No suspicious abdominal lymphadenopathy. Other:  No abdominal ascites. Musculoskeletal: No focal osseous lesions. IMPRESSION: 3.5 cm mass in the pancreatic tail, highly suspicious for pancreatic adenocarcinoma. No associated vascular invasion. Multifocal small hepatic metastases, as above. Trace bilateral pleural effusions with bibasilar atelectasis. Electronically Signed   By: Pinkie Pebbles M.D.   On: 11/12/2023 04:01   MR 3D Recon At Scanner Result Date: 11/12/2023 CLINICAL DATA:  Pancreatic mass on CT EXAM: MRI ABDOMEN WITHOUT AND WITH CONTRAST (INCLUDING MRCP) TECHNIQUE: Multiplanar multisequence MR imaging of the abdomen was performed both before and after the administration of intravenous contrast. Heavily T2-weighted images of the biliary and pancreatic ducts were obtained, and three-dimensional MRCP images were rendered by post processing. CONTRAST:  5mL GADAVIST  GADOBUTROL  1 MMOL/ML IV SOLN COMPARISON:  CT abdomen/pelvis dated 11/11/2023 FINDINGS: Lower chest: Trace bilateral pleural effusions with bibasilar atelectasis. Hepatobiliary: 13 mm rim enhancing lesion along the posterior aspect of segment 4A and 2 (series 14/image 21). Additional 12 mm rim enhancing lesion in segment 4A (series 14/image 26). Subtle 8 mm rim enhancing lesion in segment 7 (series 14/image 30). Additional foci of suspected restricted diffusion (series 6/images 82 and 89). In this clinical context, these are suspicious for metastases. Additional 2.3 cm irregular cyst in segment 8 (series 14/image 30). Gallbladder is unremarkable. No intrahepatic or extrahepatic ductal dilatation.  Pancreas: 3.5 x 1.6 cm hypoenhancing mass in the pancreatic tail (series 14/image 47), highly suspicious for pancreatic adenocarcinoma. No associated vascular invasion. Spleen:  Within normal limits. Adrenals/Urinary Tract:  Adrenal glands are within normal limits. Kidneys are within normal limits.  No hydronephrosis. Stomach/Bowel: Stomach is within normal limits. Visualized bowel is grossly unremarkable. Vascular/Lymphatic:  No evidence of abdominal aortic aneurysm. No suspicious abdominal lymphadenopathy. Other:  No abdominal ascites. Musculoskeletal: No focal osseous lesions. IMPRESSION: 3.5 cm mass in the pancreatic tail, highly suspicious for pancreatic adenocarcinoma. No associated vascular invasion. Multifocal small hepatic metastases, as above. Trace bilateral pleural effusions with bibasilar atelectasis. Electronically Signed   By: Pinkie Pebbles M.D.   On: 11/12/2023 04:01   CT CHEST ABDOMEN PELVIS W CONTRAST Result Date: 11/11/2023 CLINICAL DATA:  Trauma.  Mechanical fall.  Chest wall pain. EXAM: CT CHEST, ABDOMEN, AND PELVIS WITH CONTRAST TECHNIQUE: Multidetector CT imaging of the chest, abdomen and pelvis was performed following the standard protocol during bolus administration of intravenous contrast. RADIATION DOSE REDUCTION: This exam was performed according to the departmental dose-optimization program which includes automated exposure control, adjustment of the mA and/or kV according to patient size and/or use of iterative reconstruction technique.  CONTRAST:  80mL OMNIPAQUE  IOHEXOL  300 MG/ML  SOLN COMPARISON:  None Available. FINDINGS: CT CHEST FINDINGS Cardiovascular: There is no cardiomegaly or pericardial effusion. Mild atherosclerotic calcification of the thoracic aorta. No aneurysmal dilatation or dissection. The origins of the great vessels of the aortic arch and the central pulmonary arteries appear patent. Mediastinum/Nodes: No hilar or mediastinal adenopathy. The esophagus is  grossly unremarkable. No mediastinal fluid collection. Lungs/Pleura: Trace left pleural effusion. There are bibasilar subpleural atelectasis. No focal consolidation or pneumothorax. The central airways are patent. Musculoskeletal: Displaced fracture of the posterior left fourth rib. Nondisplaced fracture of the posterior left fifth rib and mildly displaced fractures of the lateral fifth rib. Displaced fractures of the lateral 6-9th ribs. CT ABDOMEN PELVIS FINDINGS No intra-abdominal free air or free fluid. Hepatobiliary: Indeterminate 2 cm hypodense lesion in the right lobe of the liver may represent an area of scarring or a cyst. A neoplastic process or metastatic disease is not excluded. Additional smaller hypodense lesion in the dome of the liver is also not evaluated on this CT. No biliary dilatation. The gallbladder is unremarkable. Pancreas: There is a 1.6 x 3.6 cm hypoenhancing lesion in the body of the pancreas most consistent with malignancy. There is atrophy of the tail of the pancreas. No dilatation of the main pancreatic duct. No active inflammatory changes. Spleen: Normal in size without focal abnormality. Adrenals/Urinary Tract: The adrenal glands unremarkable. The kidneys, visualized ureters, and urinary bladder appear unremarkable. Stomach/Bowel: There is no bowel obstruction or active inflammation. The appendix is normal. Vascular/Lymphatic: Mild aortoiliac atherosclerotic disease. The IVC is unremarkable. No portal venous gas. There is no adenopathy. Reproductive: The uterus is anteverted and grossly unremarkable. No adnexal masses. Other: Small fat containing umbilical hernia. Musculoskeletal: Degenerative changes of the lower lumbar spine. No acute osseous pathology. IMPRESSION: 1. Displaced fractures of the left fourth through ninth ribs. No pneumothorax. 2. 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 Further evaluation with MRI without and with contrast is recommended. 3.  Indeterminate hypodense lesions in the liver. Attention on MRI recommended. 4. No bowel obstruction. Normal appendix. 5.  Aortic Atherosclerosis (ICD10-I70.0). Electronically Signed   By: Vanetta Chou M.D.   On: 11/11/2023 11:28   CT Head Wo Contrast Result Date: 11/11/2023 CLINICAL DATA:  Neck trauma (Age >= 65y) Fall down stairs, hit head, neck pain with movement; Head trauma, minor (Age >= 65y) Fall down four stairs, hit head, possible LOC. Hx of chiari malformation s/p surgical repair EXAM: CT HEAD WITHOUT CONTRAST CT CERVICAL SPINE WITHOUT CONTRAST TECHNIQUE: Multidetector CT imaging of the head and cervical spine was performed following the standard protocol without intravenous contrast. Multiplanar CT image reconstructions of the cervical spine were also generated. RADIATION DOSE REDUCTION: This exam was performed according to the departmental dose-optimization program which includes automated exposure control, adjustment of the mA and/or kV according to patient size and/or use of iterative reconstruction technique. COMPARISON:  MRI head January 05, 2012. FINDINGS: CT HEAD FINDINGS Brain: Posterior decompression for Chiari malformation without substantial crowding at the foramen magnum. No evidence of acute large vascular territory infarct, acute hemorrhage, mass lesion or midline shift. Vascular: No hyperdense vessel. Skull: No acute fracture. Sinuses/Orbits: Clear sinuses.  No acute orbital findings. Other: No mastoid effusions. CT CERVICAL SPINE FINDINGS Alignment: Straightening. Mild anterolisthesis of C4 on C5, likely degenerative. Otherwise, no substantial sagittal subluxation. Skull base and vertebrae: No acute fracture. Vertebral body heights are maintained. Soft tissues and spinal canal: No prevertebral fluid or swelling. No  visible canal hematoma. Disc levels: Moderate to severe multilevel degenerative change, greatest at C3-C4 and C5-C6. Upper chest: Visualized lung apices are clear.  IMPRESSION: 1. No evidence of acute abnormality intracranially or in the cervical spine. 2. Posterior decompression for Chiari malformation without substantial crowding at the foramen magnum. Electronically Signed   By: Gilmore GORMAN Molt M.D.   On: 11/11/2023 11:11   CT Cervical Spine Wo Contrast Result Date: 11/11/2023 CLINICAL DATA:  Neck trauma (Age >= 65y) Fall down stairs, hit head, neck pain with movement; Head trauma, minor (Age >= 65y) Fall down four stairs, hit head, possible LOC. Hx of chiari malformation s/p surgical repair EXAM: CT HEAD WITHOUT CONTRAST CT CERVICAL SPINE WITHOUT CONTRAST TECHNIQUE: Multidetector CT imaging of the head and cervical spine was performed following the standard protocol without intravenous contrast. Multiplanar CT image reconstructions of the cervical spine were also generated. RADIATION DOSE REDUCTION: This exam was performed according to the departmental dose-optimization program which includes automated exposure control, adjustment of the mA and/or kV according to patient size and/or use of iterative reconstruction technique. COMPARISON:  MRI head January 05, 2012. FINDINGS: CT HEAD FINDINGS Brain: Posterior decompression for Chiari malformation without substantial crowding at the foramen magnum. No evidence of acute large vascular territory infarct, acute hemorrhage, mass lesion or midline shift. Vascular: No hyperdense vessel. Skull: No acute fracture. Sinuses/Orbits: Clear sinuses.  No acute orbital findings. Other: No mastoid effusions. CT CERVICAL SPINE FINDINGS Alignment: Straightening. Mild anterolisthesis of C4 on C5, likely degenerative. Otherwise, no substantial sagittal subluxation. Skull base and vertebrae: No acute fracture. Vertebral body heights are maintained. Soft tissues and spinal canal: No prevertebral fluid or swelling. No visible canal hematoma. Disc levels: Moderate to severe multilevel degenerative change, greatest at C3-C4 and C5-C6. Upper chest:  Visualized lung apices are clear. IMPRESSION: 1. No evidence of acute abnormality intracranially or in the cervical spine. 2. Posterior decompression for Chiari malformation without substantial crowding at the foramen magnum. Electronically Signed   By: Gilmore GORMAN Molt M.D.   On: 11/11/2023 11:11     The total time spent in the appointment was 40 minutes encounter with patients including review of chart and various tests results, discussions about plan of care and coordination of care plan   All questions were answered. The patient knows to call the clinic with any problems, questions or concerns. No barriers to learning was detected.  Olam PARAS Rouson, NP 1/14/20253:27 PM  Addendum I have seen the patient, examined her. I agree with the assessment and and plan and have edited the notes.   78 year old female, with past medical history of stage Ia breast cancer in 2017, status post thrombectomy, radiation and 5 years of anastrozole , no evidence recurrence, presented with incidental finding of pancreatic mass and multiple liver lesions, concerning for metastatic disease.  She is asymptomatic from the pancreatic mass and or liver metastasis.  I recommend liver biopsy to get tissue diagnosis.  I discussed the possibility of pancreatic adenocarcinoma or neuroendocrine tumor.  Tumor marker CA 19.9 is pending.  I have reached out to IR, liver biopsy has been approved, but not possible to do tomorrow, it would be scheduled after her discharge.  I plan to see her back in my office.after biopsy.  All questions were answered.  I spent a total of 45 minutes for her visit today, more than 50% time on face-to-face counseling.  Onita Mattock MD 11/12/2023

## 2023-11-12 NOTE — Evaluation (Signed)
 Occupational Therapy Evaluation Patient Details Name: Cindy Arias MRN: [id number] DOB: May 30, 1946 Today's Date: 11/12/2023   History of Present Illness 78 y/o female who presented as a trauma transfer after a fall which resulted in left 4-9 rib fractures and cephalohematoma 1/12. Pt also found to have incidental pancreatic mass and liver lesion on CT. PMH includes arthritis, asthma, hx of breast cancer, colitis, dysphagia, dyspnea, GERD, hx of infertility and radiation therapy, and esophageal stricture.   Clinical Impression   PT admitted with s/p fall with L rib fx and cephalohematoma. Pt currently with functional limitiations due to the deficits listed below (see OT problem list). Pt at baseline indep with adls and iadls. Pt (A) with spouse care in the home due to mobility and cognitive needs. Pt reports she drives as her husband will get lost especially at night time driving. Pt voiced concern that spouse likes to go out to eat in the evenings and she can't drive him right now. Answered all questions regarding ADL care in depth.  Pt will benefit from skilled OT to increase their independence and safety with adls and balance to allow discharge HHOT.        If plan is discharge home, recommend the following: A little help with walking and/or transfers;A little help with bathing/dressing/bathroom;Assist for transportation    Functional Status Assessment  Patient has had a recent decline in their functional status and demonstrates the ability to make significant improvements in function in a reasonable and predictable amount of time.  Equipment Recommendations  None recommended by OT    Recommendations for Other Services       Precautions / Restrictions Precautions Precautions: Fall Restrictions Weight Bearing Restrictions Per Provider Order: No      Mobility Bed Mobility               General bed mobility comments: OOB in chair; reviewed recommendation for exiting  right side of bed    Transfers Overall transfer level: Needs assistance   Transfers: Sit to/from Stand Sit to Stand: Contact guard assist           General transfer comment: pt able to power up with R UE from surface and educated on scoot to the edge of chair to help with power up      Balance Overall balance assessment: Mild deficits observed, not formally tested                                         ADL either performed or assessed with clinical judgement   ADL Overall ADL's : Needs assistance/impaired Eating/Feeding: Independent Eating/Feeding Details (indicate cue type and reason): eating on arrival Grooming: Wash/dry hands;Wash/dry face;Oral care;Set up;Sitting           Upper Body Dressing : Supervision/safety Upper Body Dressing Details (indicate cue type and reason): educated on dressing L UE first. talked about options in the home for clothing and two methods of dressing Lower Body Dressing: Supervision/safety Lower Body Dressing Details (indicate cue type and reason): educated on figure 4 cross Toilet Transfer: Supervision/safety           Functional mobility during ADLs: Supervision/safety General ADL Comments: sister present for entire session     Vision Baseline Vision/History: 1 Wears glasses Ability to See in Adequate Light: 0 Adequate Patient Visual Report: No change from baseline       Perception  Praxis         Pertinent Vitals/Pain Pain Assessment Pain Assessment: Faces Faces Pain Scale: Hurts little more Pain Location: L ribs Pain Descriptors / Indicators: Discomfort, Grimacing, Guarding Pain Intervention(s): Limited activity within patient's tolerance, Monitored during session, Premedicated before session, Repositioned     Extremity/Trunk Assessment Upper Extremity Assessment Upper Extremity Assessment: Right hand dominant;LUE deficits/detail LUE Deficits / Details: able to complete shoulder flexion  to 80 degrees with comfort during session.   Lower Extremity Assessment Lower Extremity Assessment: Overall WFL for tasks assessed   Cervical / Trunk Assessment Cervical / Trunk Assessment: Normal   Communication Communication Communication: No apparent difficulties   Cognition Arousal: Alert Behavior During Therapy: WFL for tasks assessed/performed Overall Cognitive Status: Within Functional Limits for tasks assessed                                       General Comments  VSS on RA    Exercises     Shoulder Instructions      Home Living Family/patient expects to be discharged to:: Private residence Living Arrangements: Spouse/significant other Available Help at Discharge: Neighbor Type of Home: House Home Access: Stairs to enter Secretary/administrator of Steps: 2 Entrance Stairs-Rails: Can reach both Home Layout: Two level Alternate Level Stairs-Number of Steps: 13 Alternate Level Stairs-Rails: Left Bathroom Shower/Tub: Chief Strategy Officer: Handicapped height Bathroom Accessibility: Yes   Home Equipment: Rollator (4 wheels);Tub bench;Grab bars - toilet;Grab bars - tub/shower;Rolling Walker (2 wheels);Cane - quad;Other (comment);Hand held shower head (Stair Lift)   Additional Comments: has neighbor support, sister lives in [place] but can help, daughter lives 4 hours away      Prior Functioning/Environment Prior Level of Function : Independent/Modified Independent;Driving             Mobility Comments: Enjoys walking ADLs Comments: drives to yoga, grocery store, church. Volunteers to feed the homeless        OT Problem List: Decreased strength;Decreased activity tolerance;Impaired balance (sitting and/or standing);Decreased safety awareness;Decreased knowledge of use of DME or AE;Decreased knowledge of precautions      OT Treatment/Interventions: Self-care/ADL training;Therapeutic exercise;Energy conservation;DME and/or AE  instruction;Manual therapy;Modalities;Therapeutic activities;Patient/family education;Balance training    OT Goals(Current goals can be found in the care plan section) Acute Rehab OT Goals Patient Stated Goal: to have pain control OT Goal Formulation: With patient/family Time For Goal Achievement: 11/26/23 Potential to Achieve Goals: Good  OT Frequency: Min 1X/week    Co-evaluation              AM-PAC OT 6 Clicks Daily Activity     Outcome Measure Help from another person eating meals?: A Little Help from another person taking care of personal grooming?: A Little Help from another person toileting, which includes using toliet, bedpan, or urinal?: A Little Help from another person bathing (including washing, rinsing, drying)?: A Little Help from another person to put on and taking off regular upper body clothing?: A Little Help from another person to put on and taking off regular lower body clothing?: A Little 6 Click Score: 18   End of Session Nurse Communication: Mobility status;Precautions  Activity Tolerance: Patient tolerated treatment well Patient left: in chair;with call bell/phone within reach;with family/visitor present  OT Visit Diagnosis: Unsteadiness on feet (R26.81);Muscle weakness (generalized) (M62.81)                Time:  8589-8557 OT Time Calculation (min): 32 min Charges:  OT General Charges $OT Visit: 1 Visit OT Evaluation $OT Eval Moderate Complexity: 1 Mod OT Treatments $Self Care/Home Management : 8-22 mins   Brynn, OTR/L  Acute Rehabilitation Services Office: [phone] .   Ely Molt 11/12/2023, 2:51 PM

## 2023-11-12 NOTE — Progress Notes (Signed)
 PROCEDURE / BIOPSY REVIEW Date: 11/12/23  Requested Biopsy site: Liver Reason for request: Mass Imaging review: I reviewed all pertinent diagnostic studies, including; CT CAP and MR Abd  Decision: Approved Imaging modality to perform: Ultrasound Schedule with: Moderate Sedation Schedule for: Any VIR  Additional comments:  @VIR : multifocal liver masses. Highest yield on CT #2, 43/122. Less challenging target, 45/122 @Schedulers . US  Liver Mass Bx. Needs updated CBC + INR on procedure day.  Please contact me with questions, concerns, or if issue pertaining to this request arise.  Thom Hall, MD [facility]

## 2023-11-12 NOTE — H&P (View-Only) (Signed)
 [facility] CONSULT NOTE  Patient Care Team: Gweneth Dimitri, MD as PCP - General (Family Medicine) Magrinat, Valentino Hue, MD (Inactive) as Consulting Physician (Oncology) Glenna Fellows, MD (Inactive) as Consulting Physician (General Surgery) Lonie Peak, MD as Attending Physician (Radiation Oncology) Maris Berger, MD as Consulting Physician (Ophthalmology) Lucie Leather, Alvira Philips, MD as Consulting Physician (Allergy and Immunology) Willis Modena, MD as Consulting Physician (Gastroenterology) Axel Filler, Larna Daughters, NP as Nurse Practitioner (Hematology and Oncology) Valeria Batman, MD (Inactive) as Consulting Physician (Orthopedic Surgery)  CHIEF COMPLAINTS/PURPOSE OF CONSULTATION:  Pancreatic mass  REFERRING PHYSICIAN: Trauma MD  HISTORY OF PRESENTING ILLNESS:  Cindy Arias 78 y.o. female who was brought to the ED at [facility] by EMS status post a fall at home.  Patient states she thinks she missed a step, fell and thinks she "blacked out".  Reports she has porcelain floors at home and thinks she hit her head pretty hard.  She was found to have left-sided rib fractures from 4th through 9th ribs.  Therefore she was transferred to the trauma service at [facility]. Workup upon admission also included CT imaging of the abdomen and pelvis which was significant for a hypoenhancing pancreatic mass most consistent with malignancy.  MRI confirmed a 3.5 cm mass in the pancreatic tail highly suspicious for pancreatic adenocarcinoma with multifocal small hepatic metastasis.  Therefore oncology consult has been requested. Medical history includes breast cancer which patient states she was diagnosed in 2022 (per records patient was diagnosed in 2017) and was treated with lumpectomy and 5 radiation therapy treatments, denies chemotherapy.  Reports she was on anastrozole but no longer takes it as she "aged out".   Medical history also includes asthma, colitis, GERD, Chiari  malformation. Surgical history includes left total knee replacement in 2021; several bunionectomies and hammertoe surgeries. Family history significant for mother with non-Hodgkin's lymphoma with liver mets, died at age 70. Social history includes 1 glass of wine per week, denies tobacco use, denies drug use.    I have reviewed her chart and materials related to her cancer extensively and collaborated history with the patient. 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     11/21/2016 Surgery   Right breast lumpectomy ([facility]): IDC, grade 2, 1.9cm, margins negative, 2 SLN negative. 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  Pancreatic tail mass with liver lesions - Incidental finding after a fall/trauma. - Likely adenocarcinoma - CT abdomen pelvis done 11/11/2023 shows hypoenhancing pancreatic mass most consistent with malignancy.  Indeterminate hypodense lesions in the liver -.  MRI done today 11/12/2023 shows 3.5 cm mass in the pancreatic tail, highly suspicious for pancreatic adenocarcinoma - Tumor marker CA 19-9 pending. - Will need biopsy with final pathology to confirm diagnosis and further treatment planning. - Medical oncology/Dr. Mosetta Putt will follow closely  2.  History of right breast cancer (ER + PR + HER2-) -  Initially diagnosed 2017 - Status post right breast lumpectomy, adjuvant radiation  therapy, and anastrozole . - Patient was lost to follow-up after her primary oncologist retired.  3.  Trauma/status post fall Left-sided rib fractures - Fractures 4th through 9th ribs on her left side - Management per trauma and Ortho teams  4.  Other: GERD Chiari malformation Colitis Asthma -Management per medicine      Orders Placed This Encounter  Procedures   CT Head Wo Contrast    Standing Status:   Standing    Number of Occurrences:   1   CT Cervical Spine Wo Contrast    Standing Status:   Standing    Number of Occurrences:   1   CT CHEST ABDOMEN PELVIS W CONTRAST    Standing Status:   Standing    Number of Occurrences:   1    Does the patient have a contrast media/X-ray dye allergy?:   No    If indicated for the ordered procedure, I authorize the administration of oral contrast media per Radiology protocol:   Yes   MR ABDOMEN W WO CONTRAST    Standing Status:   Standing    Number of Occurrences:   1    If indicated for the ordered procedure, I authorize the administration of contrast media per Radiology protocol:   Yes    What is the patient's sedation requirement?:   No Sedation    Does the patient have a pacemaker or implanted devices?:   No   MR 3D Recon At Scanner    Standing Status:   Standing    Number of Occurrences:   1    Symptom/Reason for Exam:   Pancreatic lesion [[id number]]    What is the patient's sedation requirement?:   No Sedation    Does the patient have a pacemaker or implanted devices?:   No   DG Shoulder Left Port    Standing Status:   Standing    Number of Occurrences:   1    Symptom/Reason for Exam:   Pain [[id number]]   US  BIOPSY (LIVER)    Pancreatic mass with liver lesions    Standing Status:   Standing    Number of Occurrences:   1    Lab orders requested (DO NOT place separate lab orders, these will be automatically ordered during procedure specimen collection)::   Surgical Pathology    Can the patient sign their own consent?:   Yes     Symptom/Reason for Exam:   Liver mass [[id number]]   Basic metabolic panel    Standing Status:   Standing    Number of Occurrences:   1   Lipase, blood    Standing Status:   Standing    Number of Occurrences:   1   CBC    Standing Status:   Standing    Number of Occurrences:   1   CBC    Standing Status:   Standing    Number of Occurrences:   1   Basic metabolic panel    Standing Status:   Standing    Number of Occurrences:   1   Cancer antigen 19-9    Standing Status:   Standing    Number of Occurrences:   1   Cancer antigen 19-9    Standing Status:   Standing    Number of Occurrences:   1   Diet regular Room service appropriate? Yes; Fluid consistency: Thin    Standing Status:  Standing    Number of Occurrences:   1    Room service appropriate?:   Yes    Fluid consistency::   Thin   SCDs    Standing Status:   Standing    Number of Occurrences:   1    Laterality:   Bilateral   Cardiac monitoring    Standing Status:   Standing    Number of Occurrences:   1   Vital signs    Standing Status:   Standing    Number of Occurrences:   1   Call Trauma MD on call for:    Standing Status:   Standing    Number of Occurrences:   20    Notify Physician:   Temp greater than 101.5 F    Notify Physician:   Systolic BP greater than 180 or less than 95    Notify Physician:   Diastolic BP greater than 110 or less than 55    Notify Physician:   Heart Rate greater than 120 or less than 55    Notify Physician:   Urinary Output less than 20 ml/hr by foley or 300 ml/shift if voiding   Initiate Oral Care Protocol    Standing Status:   Standing    Number of Occurrences:   1   Initiate Carrier Fluid Protocol    Standing Status:   Standing    Number of Occurrences:   1   RN may order Trauma Service PRN Orders (through manage orders) for the following patient needs:  cough (Robitussin DM), indigestion (Maalox/simethicone), minor skin irritation (hydrocortisone cream/bacitracin  ointment), sore  throat (chloraseptic spray / Cepacol), sleep (melatonin)    Standing Status:   Standing    Number of Occurrences:   20   Full code    Standing Status:   Standing    Number of Occurrences:   1    By::   Default: patient does not have capacity for decision making, no surrogate or prior directive available   Consult to trauma surgery    Standing Status:   Standing    Number of Occurrences:   1    Place call to::   oncall Trauma Surgery    Reason for Consult:   Admit   OT eval and treat    Standing Status:   Standing    Number of Occurrences:   1   PT eval and treat    Standing Status:   Standing    Number of Occurrences:   1   Incentive spirometry    Standing Status:   Standing    Number of Occurrences:   1   ED EKG    Standing Status:   Standing    Number of Occurrences:   1    Reason for Exam:   Chest Pain   EKG 12-Lead    Standing Status:   Standing    Number of Occurrences:   1   EKG 12-Lead    Standing Status:   Standing    Number of Occurrences:   1   EKG    Standing Status:   Standing    Number of Occurrences:   1   EKG    Standing Status:   Standing    Number of Occurrences:   1   Admit to Inpatient (patient's expected length of stay will be greater than 2 midnights or inpatient only procedure)    Standing Status:   Standing    Number of  Occurrences:   1    Hospital Area:   [facility] [[id number]]    Level of Care:   Med-Surg [16]    May admit patient to [facility] or [facility] if equivalent level of care is available::   No    Interfacility transfer:   Yes    Covid Evaluation:   Asymptomatic - no recent exposure (last 10 days) testing not required    Diagnosis:   Rib fractures [[id number]]    Admitting Physician:   TRAUMA MD [[id number]]    Attending Physician:   TRAUMA MD [[id number]]    Certification::   I certify this patient will need inpatient services for at least 2 midnights    Expected Medical Readiness:   11/11/2023     MEDICAL HISTORY:  Past  Medical History:  Diagnosis Date   Arthritis    bone on bone'  left knee   Asthma    LAST FLARE UP 3 YRS AGO   Breast cancer (HCC)    RIGHT BREAST   Colitis    Dysphagia    Dyspnea    NONE IN 3 YRS   Esophageal stricture    GERD (gastroesophageal reflux disease)    History of infertility    History of radiation therapy 12/31/16- 01/21/17   Right Breast 42.56 Gy in 16 fractions   Osteopenia    Pneumonia    Rosacea    Ulcerative colitis (HCC)     SURGICAL HISTORY: Past Surgical History:  Procedure Laterality Date   BREAST LUMPECTOMY WITH RADIOACTIVE SEED AND SENTINEL LYMPH NODE BIOPSY Right 11/21/2016   Procedure: BREAST LUMPECTOMY WITH RADIOACTIVE SEED AND SENTINEL LYMPH NODE BIOPSY;  Surgeon: Morene Olives, MD;  Location: [facility];  Service: General;  Laterality: Right;   BREAST SURGERY     RIGHT BREAST LUMPECTOMY   BUNIONECTOMY WITH HAMMERTOE RECONSTRUCTION Left 07/2014   CATARACT EXTRACTION Right 10/2015   ESOPHAGEAL DILATION     EYE SURGERY     OD   JOINT REPLACEMENT     Repair of Chiari Malformation  02/05/13   SUBOCCIPITAL CRANIECTOMY CERVICAL LAMINECTOMY  02/13/2012   Procedure: SUBOCCIPITAL CRANIECTOMY CERVICAL LAMINECTOMY/DURAPLASTY;  Surgeon: Reyes JONETTA Budge, MD;  Location: [facility];  Service: Neurosurgery;  Laterality: N/A;  Suboccipital craniectomy with upper cervical laminectomy and duraplasty   TOTAL KNEE ARTHROPLASTY Left 11/04/2018   TOTAL KNEE ARTHROPLASTY Left 11/04/2018   Procedure: LEFT TOTAL KNEE ARTHROPLASTY;  Surgeon: Anderson Maude ORN, MD;  Location: [facility];  Service: Orthopedics;  Laterality: Left;   TUBAL LIGATION  1987    SOCIAL HISTORY: Social History   Socioeconomic History   Marital status: Married    Spouse name: Not on file   Number of children: Not on file   Years of education: Not on file   Highest education level: Not on file  Occupational History   Not on file  Tobacco Use   Smoking status: Never    Smokeless tobacco: Never   Tobacco comments:    she smoked for a few months in college  Vaping Use   Vaping status: Never Used  Substance and Sexual Activity   Alcohol use: Yes    Comment: 1 glass of wine monthly   Drug use: No    Comment: Smoked in college ~ 2 years   Sexual activity: Not on file  Other Topics Concern   Not on file  Social History Narrative   Not on file   Social  Drivers of Corporate investment banker Strain: Not on file  Food Insecurity: No Food Insecurity (11/11/2023)   Hunger Vital Sign    Worried About Running Out of Food in the Last Year: Never true    Ran Out of Food in the Last Year: Never true  Transportation Needs: No Transportation Needs (11/11/2023)   PRAPARE - Administrator, Civil Service (Medical): No    Lack of Transportation (Non-Medical): No  Physical Activity: Not on file  Stress: Not on file  Social Connections: Socially Integrated (11/11/2023)   Social Connection and Isolation Panel [NHANES]    Frequency of Communication with Friends and Family: Three times a week    Frequency of Social Gatherings with Friends and Family: Three times a week    Attends Religious Services: More than 4 times per year    Active Member of Clubs or Organizations: Yes    Attends Banker Meetings: More than 4 times per year    Marital Status: Married  Catering manager Violence: Not At Risk (11/11/2023)   Humiliation, Afraid, Rape, and Kick questionnaire    Fear of Current or Ex-Partner: No    Emotionally Abused: No    Physically Abused: No    Sexually Abused: No    FAMILY HISTORY: Family History  Problem Relation Age of Onset   Cancer Mother        Liver- 2003 Lymphoma   Rheum arthritis Mother    Stroke Father    Diabetes Maternal Grandfather        Type 2    Heart disease Maternal Grandfather    Hypertension Maternal Grandmother    Heart disease Maternal Grandmother    Osteoporosis Maternal Grandmother    Anesthesia problems  Neg Hx     REVIEW OF SYSTEMS:   Constitutional: +Generalized pain and aches, denies fevers, chills or abnormal night sweats Eyes: Denies blurriness of vision, double vision or watery eyes Ears, nose, mouth, throat, and face: Denies mucositis or sore throat Respiratory: Denies cough, dyspnea or wheezes Cardiovascular: Denies palpitation, chest discomfort or lower extremity swelling Gastrointestinal: Denies nausea, heartburn or change in bowel habits Skin: Denies abnormal skin rashes Lymphatics: Denies new lymphadenopathy or easy bruising Neurological: Denies numbness, tingling or new weaknesses Behavioral/Psych: Mood is stable, no new changes  All other systems were reviewed with the patient and are negative.  PHYSICAL EXAMINATION: ECOG PERFORMANCE STATUS: 2 - Symptomatic, <50% confined to bed  Vitals:   11/11/23 2033 11/12/23 0547  BP: (!) 106/49 (!) 114/56  Pulse: 71 65  Resp: 18 17  Temp: 98.1 F (36.7 C) 98.1 F (36.7 C)  SpO2: 95% 96%   There were no vitals filed for this visit.  GENERAL: alert, no distress and comfortable SKIN: +Pale skin color, texture, turgor are normal, no rashes or significant lesions EYES: normal, conjunctiva are pink and non-injected, sclera clear OROPHARYNX: no exudate, no erythema and lips, buccal mucosa, and tongue normal  NECK: supple, thyroid normal size, non-tender, without nodularity LYMPH: no palpable lymphadenopathy in the cervical, axillary or inguinal LUNGS: clear to auscultation and percussion with normal breathing effort HEART: regular rate & rhythm and no murmurs and no lower extremity edema ABDOMEN: abdomen soft, non-tender and normal bowel sounds MUSCULOSKELETAL: +Ambulates with walker   PSYCH: alert & oriented x 3 with fluent speech NEURO: no focal motor/sensory deficits   ALLERGIES:  is allergic to penicillins, sulfa antibiotics, and albuterol.  MEDICATIONS:  Current Facility-Administered Medications  Medication Dose Route  Frequency Provider Last Rate Last Admin   acetaminophen  (TYLENOL ) tablet 1,000 mg  1,000 mg Oral Q6H Paola Dreama SAILOR, MD   1,000 mg at 11/12/23 1442   docusate sodium  (COLACE) capsule 100 mg  100 mg Oral BID Paola Dreama SAILOR, MD   100 mg at 11/12/23 0859   enoxaparin  (LOVENOX ) injection 30 mg  30 mg Subcutaneous Q12H Paola Dreama SAILOR, MD   30 mg at 11/12/23 9141   hydrALAZINE  (APRESOLINE ) injection 10 mg  10 mg Intravenous Q2H PRN Paola Dreama SAILOR, MD       levalbuterol  (XOPENEX  HFA) inhaler 1-2 puff  1-2 puff Inhalation Q8H PRN Maczis, Michael M, PA-C       lidocaine  (LIDODERM ) 5 % 1 patch  1 patch Transdermal Q24H Maczis, Michael M, PA-C   1 patch at 11/12/23 0858   [START ON 11/13/2023] mesalamine  (LIALDA ) EC tablet 1.2 g  1.2 g Oral Q breakfast Maczis, Michael M, PA-C       methocarbamol  (ROBAXIN ) tablet 750 mg  750 mg Oral Q8H Maczis, Michael M, PA-C   750 mg at 11/12/23 1443   metoprolol  tartrate (LOPRESSOR ) injection 5 mg  5 mg Intravenous Q6H PRN Paola Dreama SAILOR, MD       ondansetron  (ZOFRAN -ODT) disintegrating tablet 4 mg  4 mg Oral Q6H PRN Paola Dreama SAILOR, MD       Or   ondansetron  (ZOFRAN ) injection 4 mg  4 mg Intravenous Q6H PRN Paola Dreama SAILOR, MD       oxyCODONE  (Oxy IR/ROXICODONE ) immediate release tablet 5-7.5 mg  5-7.5 mg Oral Q4H PRN Maczis, Michael M, PA-C       pantoprazole  (PROTONIX ) EC tablet 40 mg  40 mg Oral Daily Maczis, Michael M, PA-C   40 mg at 11/12/23 1139   polyethylene glycol (MIRALAX  / GLYCOLAX ) packet 17 g  17 g Oral BID Maczis, Michael M, PA-C         LABORATORY DATA:  I have reviewed the data as listed Lab Results  Component Value Date   WBC 7.8 11/12/2023   HGB 12.2 11/12/2023   HCT 37.0 11/12/2023   MCV 88.3 11/12/2023   PLT 218 11/12/2023   Recent Labs    11/11/23 0936 11/12/23 0605  NA 139 139  K 4.1 4.3  CL 105 106  CO2 26 26  GLUCOSE 123* 114*  BUN 15 12  CREATININE 0.69 0.78  CALCIUM 9.2 8.6*  GFRNONAA >60 >60    RADIOGRAPHIC  STUDIES: I have personally reviewed the radiological images as listed and agreed with the findings in the report. DG Shoulder Left Port Result Date: 11/12/2023 CLINICAL DATA:  Shoulder pain after recent fall EXAM: LEFT SHOULDER COMPARISON:  CT 11/11/2023 FINDINGS: No acute fracture or malalignment. Mild AC joint degenerative change. Acute displaced left third through eighth posterolateral rib fractures. IMPRESSION: 1. No acute osseous abnormality of the left shoulder. Minimal calcific tendinopathy. 2. Acute displaced left third through eighth posterolateral rib fractures. Electronically Signed   By: Luke Bun M.D.   On: 11/12/2023 15:13   MR ABDOMEN W WO CONTRAST Result Date: 11/12/2023 CLINICAL DATA:  Pancreatic mass on CT EXAM: MRI ABDOMEN WITHOUT AND WITH CONTRAST (INCLUDING MRCP) TECHNIQUE: Multiplanar multisequence MR imaging of the abdomen was performed both before and after the administration of intravenous contrast. Heavily T2-weighted images of the biliary and pancreatic ducts were obtained, and three-dimensional MRCP images were rendered by post processing. CONTRAST:  5mL GADAVIST  GADOBUTROL  1 MMOL/ML IV SOLN COMPARISON:  CT abdomen/pelvis dated 11/11/2023 FINDINGS: Lower chest: Trace bilateral pleural effusions with bibasilar atelectasis. Hepatobiliary: 13 mm rim enhancing lesion along the posterior aspect of segment 4A and 2 (series 14/image 21). Additional 12 mm rim enhancing lesion in segment 4A (series 14/image 26). Subtle 8 mm rim enhancing lesion in segment 7 (series 14/image 30). Additional foci of suspected restricted diffusion (series 6/images 82 and 89). In this clinical context, these are suspicious for metastases. Additional 2.3 cm irregular cyst in segment 8 (series 14/image 30). Gallbladder is unremarkable. No intrahepatic or extrahepatic ductal dilatation. Pancreas: 3.5 x 1.6 cm hypoenhancing mass in the pancreatic tail (series 14/image 47), highly suspicious for pancreatic  adenocarcinoma. No associated vascular invasion. Spleen:  Within normal limits. Adrenals/Urinary Tract:  Adrenal glands are within normal limits. Kidneys are within normal limits.  No hydronephrosis. Stomach/Bowel: Stomach is within normal limits. Visualized bowel is grossly unremarkable. Vascular/Lymphatic:  No evidence of abdominal aortic aneurysm. No suspicious abdominal lymphadenopathy. Other:  No abdominal ascites. Musculoskeletal: No focal osseous lesions. IMPRESSION: 3.5 cm mass in the pancreatic tail, highly suspicious for pancreatic adenocarcinoma. No associated vascular invasion. Multifocal small hepatic metastases, as above. Trace bilateral pleural effusions with bibasilar atelectasis. Electronically Signed   By: Pinkie Pebbles M.D.   On: 11/12/2023 04:01   MR 3D Recon At Scanner Result Date: 11/12/2023 CLINICAL DATA:  Pancreatic mass on CT EXAM: MRI ABDOMEN WITHOUT AND WITH CONTRAST (INCLUDING MRCP) TECHNIQUE: Multiplanar multisequence MR imaging of the abdomen was performed both before and after the administration of intravenous contrast. Heavily T2-weighted images of the biliary and pancreatic ducts were obtained, and three-dimensional MRCP images were rendered by post processing. CONTRAST:  5mL GADAVIST  GADOBUTROL  1 MMOL/ML IV SOLN COMPARISON:  CT abdomen/pelvis dated 11/11/2023 FINDINGS: Lower chest: Trace bilateral pleural effusions with bibasilar atelectasis. Hepatobiliary: 13 mm rim enhancing lesion along the posterior aspect of segment 4A and 2 (series 14/image 21). Additional 12 mm rim enhancing lesion in segment 4A (series 14/image 26). Subtle 8 mm rim enhancing lesion in segment 7 (series 14/image 30). Additional foci of suspected restricted diffusion (series 6/images 82 and 89). In this clinical context, these are suspicious for metastases. Additional 2.3 cm irregular cyst in segment 8 (series 14/image 30). Gallbladder is unremarkable. No intrahepatic or extrahepatic ductal dilatation.  Pancreas: 3.5 x 1.6 cm hypoenhancing mass in the pancreatic tail (series 14/image 47), highly suspicious for pancreatic adenocarcinoma. No associated vascular invasion. Spleen:  Within normal limits. Adrenals/Urinary Tract:  Adrenal glands are within normal limits. Kidneys are within normal limits.  No hydronephrosis. Stomach/Bowel: Stomach is within normal limits. Visualized bowel is grossly unremarkable. Vascular/Lymphatic:  No evidence of abdominal aortic aneurysm. No suspicious abdominal lymphadenopathy. Other:  No abdominal ascites. Musculoskeletal: No focal osseous lesions. IMPRESSION: 3.5 cm mass in the pancreatic tail, highly suspicious for pancreatic adenocarcinoma. No associated vascular invasion. Multifocal small hepatic metastases, as above. Trace bilateral pleural effusions with bibasilar atelectasis. Electronically Signed   By: Pinkie Pebbles M.D.   On: 11/12/2023 04:01   CT CHEST ABDOMEN PELVIS W CONTRAST Result Date: 11/11/2023 CLINICAL DATA:  Trauma.  Mechanical fall.  Chest wall pain. EXAM: CT CHEST, ABDOMEN, AND PELVIS WITH CONTRAST TECHNIQUE: Multidetector CT imaging of the chest, abdomen and pelvis was performed following the standard protocol during bolus administration of intravenous contrast. RADIATION DOSE REDUCTION: This exam was performed according to the departmental dose-optimization program which includes automated exposure control, adjustment of the mA and/or kV according to patient size and/or use of iterative reconstruction technique.  CONTRAST:  80mL OMNIPAQUE IOHEXOL 300 MG/ML  SOLN COMPARISON:  None Available. FINDINGS: CT CHEST FINDINGS Cardiovascular: There is no cardiomegaly or pericardial effusion. Mild atherosclerotic calcification of the thoracic aorta. No aneurysmal dilatation or dissection. The origins of the great vessels of the aortic arch and the central pulmonary arteries appear patent. Mediastinum/Nodes: No hilar or mediastinal adenopathy. The esophagus is  grossly unremarkable. No mediastinal fluid collection. Lungs/Pleura: Trace left pleural effusion. There are bibasilar subpleural atelectasis. No focal consolidation or pneumothorax. The central airways are patent. Musculoskeletal: Displaced fracture of the posterior left fourth rib. Nondisplaced fracture of the posterior left fifth rib and mildly displaced fractures of the lateral fifth rib. Displaced fractures of the lateral 6-9th ribs. CT ABDOMEN PELVIS FINDINGS No intra-abdominal free air or free fluid. Hepatobiliary: Indeterminate 2 cm hypodense lesion in the right lobe of the liver may represent an area of scarring or a cyst. A neoplastic process or metastatic disease is not excluded. Additional smaller hypodense lesion in the dome of the liver is also not evaluated on this CT. No biliary dilatation. The gallbladder is unremarkable. Pancreas: There is a 1.6 x 3.6 cm hypoenhancing lesion in the body of the pancreas most consistent with malignancy. There is atrophy of the tail of the pancreas. No dilatation of the main pancreatic duct. No active inflammatory changes. Spleen: Normal in size without focal abnormality. Adrenals/Urinary Tract: The adrenal glands unremarkable. The kidneys, visualized ureters, and urinary bladder appear unremarkable. Stomach/Bowel: There is no bowel obstruction or active inflammation. The appendix is normal. Vascular/Lymphatic: Mild aortoiliac atherosclerotic disease. The IVC is unremarkable. No portal venous gas. There is no adenopathy. Reproductive: The uterus is anteverted and grossly unremarkable. No adnexal masses. Other: Small fat containing umbilical hernia. Musculoskeletal: Degenerative changes of the lower lumbar spine. No acute osseous pathology. IMPRESSION: 1. Displaced fractures of the left fourth through ninth ribs. No pneumothorax. 2. 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 Further evaluation with MRI without and with contrast is recommended. 3.  Indeterminate hypodense lesions in the liver. Attention on MRI recommended. 4. No bowel obstruction. Normal appendix. 5.  Aortic Atherosclerosis (ICD10-I70.0). Electronically Signed   By: Elgie Collard M.D.   On: 11/11/2023 11:28   CT Head Wo Contrast Result Date: 11/11/2023 CLINICAL DATA:  Neck trauma (Age >= 65y) Fall down stairs, hit head, neck pain with movement; Head trauma, minor (Age >= 65y) Fall down four stairs, hit head, possible LOC. Hx of chiari malformation s/p surgical repair EXAM: CT HEAD WITHOUT CONTRAST CT CERVICAL SPINE WITHOUT CONTRAST TECHNIQUE: Multidetector CT imaging of the head and cervical spine was performed following the standard protocol without intravenous contrast. Multiplanar CT image reconstructions of the cervical spine were also generated. RADIATION DOSE REDUCTION: This exam was performed according to the departmental dose-optimization program which includes automated exposure control, adjustment of the mA and/or kV according to patient size and/or use of iterative reconstruction technique. COMPARISON:  MRI head January 05, 2012. FINDINGS: CT HEAD FINDINGS Brain: Posterior decompression for Chiari malformation without substantial crowding at the foramen magnum. No evidence of acute large vascular territory infarct, acute hemorrhage, mass lesion or midline shift. Vascular: No hyperdense vessel. Skull: No acute fracture. Sinuses/Orbits: Clear sinuses.  No acute orbital findings. Other: No mastoid effusions. CT CERVICAL SPINE FINDINGS Alignment: Straightening. Mild anterolisthesis of C4 on C5, likely degenerative. Otherwise, no substantial sagittal subluxation. Skull base and vertebrae: No acute fracture. Vertebral body heights are maintained. Soft tissues and spinal canal: No prevertebral fluid or swelling. No  visible canal hematoma. Disc levels: Moderate to severe multilevel degenerative change, greatest at C3-C4 and C5-C6. Upper chest: Visualized lung apices are clear.  IMPRESSION: 1. No evidence of acute abnormality intracranially or in the cervical spine. 2. Posterior decompression for Chiari malformation without substantial crowding at the foramen magnum. Electronically Signed   By: Gilmore GORMAN Molt M.D.   On: 11/11/2023 11:11   CT Cervical Spine Wo Contrast Result Date: 11/11/2023 CLINICAL DATA:  Neck trauma (Age >= 65y) Fall down stairs, hit head, neck pain with movement; Head trauma, minor (Age >= 65y) Fall down four stairs, hit head, possible LOC. Hx of chiari malformation s/p surgical repair EXAM: CT HEAD WITHOUT CONTRAST CT CERVICAL SPINE WITHOUT CONTRAST TECHNIQUE: Multidetector CT imaging of the head and cervical spine was performed following the standard protocol without intravenous contrast. Multiplanar CT image reconstructions of the cervical spine were also generated. RADIATION DOSE REDUCTION: This exam was performed according to the departmental dose-optimization program which includes automated exposure control, adjustment of the mA and/or kV according to patient size and/or use of iterative reconstruction technique. COMPARISON:  MRI head January 05, 2012. FINDINGS: CT HEAD FINDINGS Brain: Posterior decompression for Chiari malformation without substantial crowding at the foramen magnum. No evidence of acute large vascular territory infarct, acute hemorrhage, mass lesion or midline shift. Vascular: No hyperdense vessel. Skull: No acute fracture. Sinuses/Orbits: Clear sinuses.  No acute orbital findings. Other: No mastoid effusions. CT CERVICAL SPINE FINDINGS Alignment: Straightening. Mild anterolisthesis of C4 on C5, likely degenerative. Otherwise, no substantial sagittal subluxation. Skull base and vertebrae: No acute fracture. Vertebral body heights are maintained. Soft tissues and spinal canal: No prevertebral fluid or swelling. No visible canal hematoma. Disc levels: Moderate to severe multilevel degenerative change, greatest at C3-C4 and C5-C6. Upper chest:  Visualized lung apices are clear. IMPRESSION: 1. No evidence of acute abnormality intracranially or in the cervical spine. 2. Posterior decompression for Chiari malformation without substantial crowding at the foramen magnum. Electronically Signed   By: Gilmore GORMAN Molt M.D.   On: 11/11/2023 11:11     The total time spent in the appointment was 40 minutes encounter with patients including review of chart and various tests results, discussions about plan of care and coordination of care plan   All questions were answered. The patient knows to call the clinic with any problems, questions or concerns. No barriers to learning was detected.  Olam PARAS Rouson, NP 1/14/20253:27 PM  Addendum I have seen the patient, examined her. I agree with the assessment and and plan and have edited the notes.   78 year old female, with past medical history of stage Ia breast cancer in 2017, status post thrombectomy, radiation and 5 years of anastrozole , no evidence recurrence, presented with incidental finding of pancreatic mass and multiple liver lesions, concerning for metastatic disease.  She is asymptomatic from the pancreatic mass and or liver metastasis.  I recommend liver biopsy to get tissue diagnosis.  I discussed the possibility of pancreatic adenocarcinoma or neuroendocrine tumor.  Tumor marker CA 19.9 is pending.  I have reached out to IR, liver biopsy has been approved, but not possible to do tomorrow, it would be scheduled after her discharge.  I plan to see her back in my office.after biopsy.  All questions were answered.  I spent a total of 45 minutes for her visit today, more than 50% time on face-to-face counseling.  Onita Mattock MD 11/12/2023

## 2023-11-12 NOTE — Evaluation (Signed)
 Physical Therapy Evaluation Patient Details Name: Cindy Arias MRN: [id number] DOB: September 29, 1946 Today's Date: 11/12/2023  History of Present Illness  78 y/o female who presented as a trauma transfer after a fall which resulted in left 4-9 rib fractures and cephalohematoma 1/12. Pt also found to have incidental pancreatic mass and liver lesion on CT. PMH includes arthritis, asthma, hx of breast cancer, colitis, dysphagia, dyspnea, GERD, hx of infertility and radiation therapy, and esophageal stricture.  Clinical Impression  PTA, pt lives with her spouse and is independent and active. Pt spouse with mobility issues at baseline; pt assists with IADL's, but no physical duties. Pt presents with decreased functional mobility secondary to pain, mild dynamic balance deficits, and decreased endurance. Pt with tenderness to palpation along left infraspinatus and ribs. Encouraged pillow splinting and IS as tolerated. Pt overall is mobilizing well. Ambulating household distances with a RW without physical assist; VSS on RA. Demonstrates improved fluidity and gait speed with RW vs without so recommended as assistive device currently. Recommend HHPT at d/c.      If plan is discharge home, recommend the following: Assistance with cooking/housework;Assist for transportation;Help with stairs or ramp for entrance   Can travel by private vehicle        Equipment Recommendations None recommended by PT (pt equipped)  Recommendations for Other Services       Functional Status Assessment Patient has had a recent decline in their functional status and demonstrates the ability to make significant improvements in function in a reasonable and predictable amount of time.     Precautions / Restrictions Precautions Precautions: Fall Restrictions Weight Bearing Restrictions Per Provider Order: No      Mobility  Bed Mobility               General bed mobility comments: OOB in chair; reviewed  recommendation for exiting right side of bed    Transfers Overall transfer level: Needs assistance Equipment used: Rolling walker (2 wheels) Transfers: Sit to/from Stand Sit to Stand: Contact guard assist           General transfer comment: Verbal cues for reciprocal scooting to edge of chair, pillow splinting to L side, pushing off with RUE from chair to stand with CGA.    Ambulation/Gait Ambulation/Gait assistance: Contact guard assist Gait Distance (Feet): 100 Feet Assistive device: Rolling walker (2 wheels), None Gait Pattern/deviations: Step-through pattern, Decreased stride length Gait velocity: decreased     General Gait Details: Trialed with and without RW; slow pace, no overt LOB. Pt with decreased fluidity and slightly more imbalance without RW  Stairs Stairs: Yes Stairs assistance: Contact guard assist Stair Management: With walker, Forwards Number of Stairs: 1 General stair comments: Verbal cues for sequencing/technique  Wheelchair Mobility     Tilt Bed    Modified Rankin (Stroke Patients Only)       Balance Overall balance assessment: Mild deficits observed, not formally tested                                           Pertinent Vitals/Pain Pain Assessment Pain Assessment: 0-10 Pain Score: 8  Pain Location: L ribs Pain Descriptors / Indicators: Discomfort, Grimacing, Guarding Pain Intervention(s): Limited activity within patient's tolerance, Monitored during session, Other (comment) (encouraged pillow splinting)    Home Living Family/patient expects to be discharged to:: Private residence Living Arrangements: Spouse/significant other Available Help at  Discharge: Neighbor Type of Home: House Home Access: Stairs to enter Entrance Stairs-Rails: Can reach both Entrance Stairs-Number of Steps: 2 Alternate Level Stairs-Number of Steps: 13 Home Layout: Two level Home Equipment: Rollator (4 wheels);Tub bench;Grab bars -  toilet;Grab bars - tub/shower;Rolling Walker (2 wheels);Cane - quad;Other (comment) 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)      Prior Function Prior Level of Function : Independent/Modified Independent;Driving             Mobility Comments: Enjoys walking       Extremity/Trunk Assessment   Upper Extremity Assessment Upper Extremity Assessment: Defer to OT evaluation    Lower Extremity Assessment Lower Extremity Assessment: Overall WFL for tasks assessed    Cervical / Trunk Assessment Cervical / Trunk Assessment: Normal  Communication   Communication Communication: No apparent difficulties  Cognition Arousal: Alert Behavior During Therapy: WFL for tasks assessed/performed Overall Cognitive Status: Within Functional Limits for tasks assessed                                          General Comments      Exercises     Assessment/Plan    PT Assessment Patient needs continued PT services  PT Problem List Decreased strength;Decreased activity tolerance;Decreased balance;Decreased mobility;Pain       PT Treatment Interventions DME instruction;Gait training;Functional mobility training;Stair training;Therapeutic activities;Therapeutic exercise;Balance training;Patient/family education    PT Goals (Current goals can be found in the Care Plan section)  Acute Rehab PT Goals Patient Stated Goal: Wants to walk and drive w/o pain PT Goal Formulation: With patient Time For Goal Achievement: 11/26/23 Potential to Achieve Goals: Good    Frequency Min 1X/week     Co-evaluation               AM-PAC PT 6 Clicks Mobility  Outcome Measure Help needed turning from your back to your side while in a flat bed without using bedrails?: None Help needed moving from lying on your back to sitting on the side of a flat bed without using bedrails?: None Help needed moving to and from a bed to a chair (including a wheelchair)?: A Little Help needed standing up from a chair using your  arms (e.g., wheelchair or bedside chair)?: A Little Help needed to walk in hospital room?: A Little Help needed climbing 3-5 steps with a railing? : A Little 6 Click Score: 20    End of Session Equipment Utilized During Treatment: Gait belt Activity Tolerance: Patient tolerated treatment well Patient left: in bed;with family/visitor present;Other (comment) (for X-ray)   PT Visit Diagnosis: Unsteadiness on feet (R26.81);Pain Pain - Right/Left: Left Pain - part of body:  (ribs)    Time: 1023-1106 PT Time Calculation (min) (ACUTE ONLY): 43 min   Charges:   PT Evaluation $PT Eval Low Complexity: 1 Low PT Treatments $Gait Training: 8-22 mins $Therapeutic Activity: 8-22 mins PT General Charges $$ ACUTE PT VISIT: 1 Visit         Aleck Daring, PT, DPT Acute Rehabilitation Services Office [phone]   Alayne ONEIDA Daring 11/12/2023, 1:22 PM

## 2023-11-12 NOTE — Plan of Care (Signed)

## 2023-11-13 ENCOUNTER — Other Ambulatory Visit (HOSPITAL_COMMUNITY): Payer: Self-pay

## 2023-11-13 ENCOUNTER — Other Ambulatory Visit: Payer: Self-pay | Admitting: Hematology

## 2023-11-13 ENCOUNTER — Telehealth: Payer: Self-pay | Admitting: Hematology

## 2023-11-13 ENCOUNTER — Encounter (HOSPITAL_COMMUNITY): Payer: Self-pay | Admitting: Hematology

## 2023-11-13 ENCOUNTER — Telehealth (HOSPITAL_COMMUNITY): Payer: Self-pay | Admitting: Pharmacy Technician

## 2023-11-13 DIAGNOSIS — K8689 Other specified diseases of pancreas: Secondary | ICD-10-CM

## 2023-11-13 LAB — CANCER ANTIGEN 19-9: CA 19-9: 13 U/mL (ref 0–35)

## 2023-11-13 MED ORDER — ACETAMINOPHEN 500 MG PO TABS: 1000.0000 mg | ORAL_TABLET | Freq: Three times a day (TID) | ORAL | Status: AC | PRN

## 2023-11-13 MED ORDER — POLYETHYLENE GLYCOL 3350 17 G PO PACK: 17.0000 g | PACK | Freq: Two times a day (BID) | ORAL | Status: DC | PRN

## 2023-11-13 MED ORDER — DOCUSATE SODIUM 100 MG PO CAPS: 100.0000 mg | ORAL_CAPSULE | Freq: Two times a day (BID) | ORAL | Status: DC | PRN

## 2023-11-13 MED ORDER — OXYCODONE HCL 5 MG PO TABS
5.0000 mg | ORAL_TABLET | Freq: Four times a day (QID) | ORAL | 0 refills | Status: AC | PRN
  Filled 2023-11-13: qty 15, 3d supply, fill #0

## 2023-11-13 MED ORDER — METHOCARBAMOL 750 MG PO TABS
750.0000 mg | ORAL_TABLET | Freq: Three times a day (TID) | ORAL | 0 refills | Status: DC | PRN
  Filled 2023-11-13: qty 60, 20d supply, fill #0

## 2023-11-13 MED ORDER — LIDOCAINE 5 % EX PTCH
1.0000 | MEDICATED_PATCH | CUTANEOUS | 0 refills | Status: DC
  Filled 2023-11-13: qty 30, 30d supply, fill #0

## 2023-11-13 NOTE — Telephone Encounter (Signed)
 Left patient a voicemail in regards to scheduling future appointment per Dr. Maryalice Smaller request; left callback number for scheduling

## 2023-11-13 NOTE — Telephone Encounter (Signed)
 Pharmacy Patient Advocate Encounter   Received notification that prior authorization for Lidocaine  5% patches is required/requested.   Insurance verification completed.   The patient is insured through [facility] .   Per test claim: PA required; PA submitted to above mentioned insurance via CoverMyMeds Key/confirmation #/EOC [id number] Status is pending

## 2023-11-13 NOTE — Plan of Care (Signed)
  Problem: Education: Goal: Knowledge of General Education information will improve Description: Including pain rating scale, medication(s)/side effects and non-pharmacologic comfort measures Outcome: Completed/Met   Problem: Health Behavior/Discharge Planning: Goal: Ability to manage health-related needs will improve Outcome: Completed/Met   Problem: Clinical Measurements: Goal: Ability to maintain clinical measurements within normal limits will improve Outcome: Completed/Met Goal: Will remain free from infection Outcome: Completed/Met Goal: Diagnostic test results will improve Outcome: Completed/Met Goal: Respiratory complications will improve Outcome: Completed/Met Goal: Cardiovascular complication will be avoided Outcome: Completed/Met   Problem: Activity: Goal: Risk for activity intolerance will decrease Outcome: Completed/Met   Problem: Nutrition: Goal: Adequate nutrition will be maintained Outcome: Completed/Met   Problem: Coping: Goal: Level of anxiety will decrease Outcome: Completed/Met   Problem: Elimination: Goal: Will not experience complications related to bowel motility Outcome: Completed/Met Goal: Will not experience complications related to urinary retention Outcome: Completed/Met   Problem: Pain Management: Goal: General experience of comfort will improve Outcome: Completed/Met   Problem: Safety: Goal: Ability to remain free from injury will improve Outcome: Completed/Met   Problem: Skin Integrity: Goal: Risk for impaired skin integrity will decrease Outcome: Completed/Met   Problem: Acute Rehab PT Goals(only PT should resolve) Goal: Pt Will Go Supine/Side To Sit Outcome: Completed/Met Goal: Pt Will Go Sit To Supine/Side Outcome: Completed/Met Goal: Patient Will Transfer Sit To/From Stand Outcome: Completed/Met Goal: Pt Will Ambulate Outcome: Completed/Met Goal: Pt Will Go Up/Down Stairs Outcome: Completed/Met   Problem: Acute Rehab OT  Goals (only OT should resolve) Goal: Pt. Will Perform Tub/Shower Transfer Outcome: Completed/Met Goal: OT Additional ADL Goal #1 Outcome: Completed/Met Goal: OT Additional ADL Goal #2 Outcome: Completed/Met

## 2023-11-13 NOTE — Discharge Instructions (Addendum)
 This Friday, 11/15/23,  patient is to be here at [facility] at 8 am.   Liver biopsy procedure is scheduled for 10 am.   DO NOT EAT OR DRINK after MN on Thursday night so sedation can be given.   NO Blood thinners (Aspirin , Plavix, Brilinta, Eliquis, Xarelto, Lovenox , Heparin , Warfarin, etc).  Someone will need to drive her and be with her 24 hours after procedure.  She will see an IR Provider that morning to explain the procedure.

## 2023-11-13 NOTE — Progress Notes (Signed)
 Physical Therapy Treatment and Discharge Patient Details Name: Cindy Arias MRN: [id number] DOB: 20-Feb-1946 Today's Date: 11/13/2023   History of Present Illness 78 y/o female who presented as a trauma transfer after a fall which resulted in left 4-9 rib fractures and cephalohematoma 1/12. Pt also found to have incidental pancreatic mass and liver lesion on CT. PMH includes arthritis, asthma, hx of breast cancer, colitis, dysphagia, dyspnea, GERD, hx of infertility and radiation therapy, and esophageal stricture.    PT Comments  The patient has progressed well and is trending towards meeting her goals. Focus of today's session was modulate pain in order to perform functional activities independently. The patient was able to ambulate within the unit Mod I with a RW (521ft) and was educated on how to perform a log roll in the bed independently. The patient did each activity well without any difficulty. Plan for discharge with HHPT in order to maximize functional level of independence.    If plan is discharge home, recommend the following: Assistance with cooking/housework;Assist for transportation   Can travel by private vehicle        Equipment Recommendations  None recommended by PT (Patient has all the equipment needed at hom)    Recommendations for Other Services       Precautions / Restrictions Precautions Precautions: Fall Restrictions Weight Bearing Restrictions Per Provider Order: No     Mobility  Bed Mobility Overal bed mobility: Modified Independent Bed Mobility: Sit to Supine       Sit to supine: Modified independent (Device/Increase time)   General bed mobility comments: Patient was able to demonstrate a log roll with verbal cues, educated the pt on placing a pillow across her left rib to limit pain/discomfort    Transfers Overall transfer level: Independent Equipment used: Rolling walker (2 wheels) Transfers: Sit to/from Stand Sit to Stand: Modified  independent (Device/Increase time)           General transfer comment: Verbal cues for reciprocal scooting to edge of chair, pillow splinting to L side, pushing off with RUE from chair to stand with Mod I    Ambulation/Gait Ambulation/Gait assistance: Modified independent (Device/Increase time) Gait Distance (Feet): 540 Feet Assistive device: Rolling walker (2 wheels) Gait Pattern/deviations: Step-through pattern           Stairs             Wheelchair Mobility     Tilt Bed    Modified Rankin (Stroke Patients Only)       Balance Overall balance assessment: Modified Independent                                          Cognition Arousal: Alert Behavior During Therapy: WFL for tasks assessed/performed Overall Cognitive Status: Within Functional Limits for tasks assessed                                          Exercises      General Comments        Pertinent Vitals/Pain Pain Assessment Pain Assessment: Faces Faces Pain Scale: Hurts a little bit Pain Location: L ribs Pain Descriptors / Indicators: Discomfort, Grimacing, Guarding Pain Intervention(s): Monitored during session    Home Living Family/patient expects to be discharged to:: Private residence Living Arrangements: Spouse/significant other  Available Help at Discharge: Neighbor Type of Home: House Home Access: Stairs to enter Entrance Stairs-Rails: Can reach both Entrance Stairs-Number of Steps: 2 Alternate Level Stairs-Number of Steps: 13 Home Layout: Two level Home Equipment: Rollator (4 wheels);Tub bench;Grab bars - toilet;Grab bars - tub/shower;Rolling Walker (2 wheels);Cane - quad;Other (comment);Hand held shower head Additional Comments: has neighbor support, sister lives in [place] but can help, daughter lives 4 hours away    Prior Function            PT Goals (current goals can now be found in the care plan section) Acute Rehab PT  Goals Patient Stated Goal: Wants to walk and drive w/o pain PT Goal Formulation: With patient Time For Goal Achievement: 11/26/23 Potential to Achieve Goals: Good Progress towards PT goals: Goals met/education completed, patient discharged from PT    Frequency    Min 1X/week      PT Plan      Co-evaluation              AM-PAC PT "6 Clicks" Mobility   Outcome Measure  Help needed turning from your back to your side while in a flat bed without using bedrails?: None Help needed moving from lying on your back to sitting on the side of a flat bed without using bedrails?: None Help needed moving to and from a bed to a chair (including a wheelchair)?: None Help needed standing up from a chair using your arms (e.g., wheelchair or bedside chair)?: None Help needed to walk in hospital room?: None Help needed climbing 3-5 steps with a railing? : A Little 6 Click Score: 23    End of Session Equipment Utilized During Treatment: Gait belt Activity Tolerance: Patient tolerated treatment well Patient left: in chair;with call bell/phone within reach;with family/visitor present Nurse Communication: Mobility status PT Visit Diagnosis: Unsteadiness on feet (R26.81);Pain Pain - Right/Left: Left     Time: 2130-8657 PT Time Calculation (min) (ACUTE ONLY): 37 min  Charges:    $Therapeutic Activity: 8-22 mins $Self Care/Home Management: 8-22 PT General Charges $$ ACUTE PT VISIT: 1 Visit                    Jacquelynne Matters, SPT   Maylen Waltermire 11/13/2023, 2:15 PM

## 2023-11-13 NOTE — Discharge Summary (Signed)
Patient ID: ANEEKA JEN [id number] 02-Oct-1946 78 y.o.  Admit date: 11/11/2023 Discharge date: 11/13/2023  Discharge Diagnosis Cindy Arias is an 78 y.o. female who presented as a trauma transfer after a fall down 4 steps on 1/12 Left 4-9 Rib fractures - Cephalohematoma Incidental pancreatic mass and liver lesion on CT Hx Asthma Hx UC   Hx Chiari Malformation s/p surgical repair   Consultants Oncology IR  HPI Cindy Arias is an 78 y.o. female who presented as a trauma transfer after a fall.  She fell down 4 steps.  She fell last night, woke up this morning in even more pain so she presented to [facility] and was found to have rib fractures and possible pancreatic mass on CT so she was transferred to the trauma service at [facility]. She also has a sore neck and a knot on her head   Procedures None  Hospital Course:  Cindy Arias is an 78 y.o. female who presented as a trauma transfer after a fall down 4 steps on 1/12  Left 4-9 Rib fractures - Tx w/ multimodal pain control, pulmonary toilet  Cephalohematoma - Local care  L shoulder pain - xray negative for fx  Incidental pancreatic mass and liver lesion on CT - MRI w/ 3.5 x 1.6 cm mass in the pancreatic tail, highly suspicious for pancreatic adenocarcinoma with areas suspicious for multifocal small hepatic metastases  There is no associated vascular invasion. She had a CT CAP on admission. CA 19-9 13. Reviewed with one of our hepatobiliary surgeons - recommended medical oncology consult. Dr. Mosetta Putt has seen and will follow as outpatient. Plans for IR liver bx for tissue dx. This will be done as an outpatient. Scheduled for 1/17.   Patient worked with therapies during admission and was recommended for [facility]. TOC arranging. Patient already has DME recommended. On 11/13/23, the patient was voiding well, tolerating diet, working well with therapies, pain controlled with po medications, vital signs stable  and felt stable for discharge home. Follow up as noted below. Discussed discharge instructions, restrictions and return/call back precautions.   Physical Exam: Please see progress note from earlier today.  Allergies as of 11/13/2023       Reactions   Penicillins Hives, Other (See Comments)   UNSPECIFIED CHILDHOOD REACTION  Had reaction as a child and as an adult. Had additional reaction but not clear on the specifics   Sulfa Antibiotics Hives   Albuterol Other (See Comments)   UNSPECIFIED REACTION  States she cannot tolerate albuterol without side effects        Medication List     TAKE these medications    acetaminophen 500 MG tablet Commonly known as: TYLENOL Take 2 tablets (1,000 mg total) by mouth every 8 (eight) hours as needed.   budesonide-formoterol 160-4.5 MCG/ACT inhaler Commonly known as: Symbicort Inhale 2 puffs into the lungs in the morning and at bedtime.   CALCIUM CITRATE-VITAMIN D PO Take 1 tablet by mouth daily.   docusate sodium 100 MG capsule Commonly known as: COLACE Take 1 capsule (100 mg total) by mouth 2 (two) times daily as needed for mild constipation.   fluticasone 50 MCG/ACT nasal spray Commonly known as: FLONASE Place 2 sprays into both nostrils daily as needed for allergies or rhinitis.   levalbuterol 45 MCG/ACT inhaler Commonly known as: XOPENEX HFA Inhale 2 puffs into the lungs See admin instructions. Every 4-6 hours prn for sob, cough   lidocaine 5 % Commonly  known as: LIDODERM Place 1 patch onto the skin daily. Remove & Discard patch within 12 hours or as directed by MD Start taking on: November 14, 2023   mesalamine 1.2 g EC tablet Commonly known as: LIALDA Take 1.2 g by mouth daily with breakfast.   methocarbamol 750 MG tablet Commonly known as: ROBAXIN Take 1 tablet (750 mg total) by mouth every 8 (eight) hours as needed for muscle spasms.   multivitamins with iron Tabs tablet Take 1 tablet by mouth daily.   omeprazole  40 MG capsule Commonly known as: PRILOSEC Take 1 capsule (40 mg total) by mouth daily. Take 1 capsule by mouth 1-2 times a day   oxyCODONE 5 MG immediate release tablet Commonly known as: Oxy IR/ROXICODONE Take 1-1.5 tablets (5-7.5 mg total) by mouth every 6 (six) hours as needed for breakthrough pain.   polyethylene glycol 17 g packet Commonly known as: MIRALAX / GLYCOLAX Take 17 g by mouth 2 (two) times daily as needed.   vitamin E 180 MG (400 UNITS) capsule Take 400 Units by mouth daily.          Follow-up Information     [facility] Follow up on 11/15/2023.   Why: This Friday, 11/15/23,  patient is to be here at [facility] at 8 am.    Liver biopsy procedure is scheduled for 10 am.    DO NOT EAT OR DRINK after MN on Thursday night so sedation can be given.    NO Blood thinners (Aspirin, Plavix, Brilinta, Eliquis, Xarelto, Lovenox, Heparin, Warfarin, etc).   Someone will need to drive her and be with her 24 hours after procedure.   She will see an IR Provider Friday morning to explain the procedure. Contact information: [address] [place] [address] [phone]        Gweneth Dimitri, MD Follow up.   Specialty: Family Medicine Why: For post hospitilization follow up Contact information: [address] [place] [address] [phone]         Malachy Mood, MD Follow up.   Specialties: Hematology, Oncology Why: For follow up regarding your pancreatic mass and liver lesion Contact information: [address] [place] [address] [phone]         [facility] Follow up.   Why: As needed Contact information: [address] [place] [address] [phone]                Signed: Leary Roca, [facility] 11/13/2023, 11:49 AM Please see Amion for pager number during day hours 7:00am-4:30pm

## 2023-11-13 NOTE — TOC Transition Note (Addendum)
Transition of Care [facility]) - Discharge Note   Patient Details  Name: Cindy Arias MRN: [id number] Date of Birth: [birth year]  Transition of [facility]) CM/SW Contact:  Glennon Mac, RN Phone Number: 11/13/2023, 11:50am  Clinical Narrative:    78 y/o female who presented as a trauma transfer after a fall which resulted in left 4-9 rib fractures and cephalohematoma 1/12. Pt also found to have incidental pancreatic mass and liver lesion on CT.  PTA, pt independent and lives at home with spouse, who can provide needed assistance.  PT/OT recommending HH follow up, and patient agreeable to services. She states she has no preference for [facility] agency.   Referral to [facility] for continued PT/OT.  No DME recommended.    Addendum: 2:12pm Patient was discharged before [facility] agency/insurance could be confirmed.  Patient accepted by [facility] for HHPT/OT; left message on patient's voicemail requesting call back about HH set up.     Final next level of care: Home w Home Health Services Barriers to Discharge: Barriers Resolved   Patient Goals and CMS Choice   CMS Medicare.gov Compare Post Acute Care list provided to:: Patient Choice offered to / list presented to : Patient                            Discharge Plan and Services Additional resources added to the After Visit Summary for     Discharge Planning Services: CM Consult Post Acute Care Choice: Home Health                    HH Arranged: PT, OT [facility] Agency: [facility] Date [facility] Agency Contacted: 11/13/23 Time HH Agency Contacted: 1155 Representative spoke with at [facility] Agency: Marylene Land  Social Drivers of Health (SDOH) Interventions SDOH Screenings   Food Insecurity: No Food Insecurity (11/11/2023)  Housing: Low Risk  (11/11/2023)  Transportation Needs: No Transportation Needs (11/11/2023)  Utilities: Not At Risk (11/11/2023)  Social Connections: Socially Integrated (11/11/2023)  Tobacco Use: Low Risk  (11/11/2023)      Readmission Risk Interventions     No data to display         Quintella Baton, RN, BSN  Trauma/Neuro ICU Case Manager [phone]

## 2023-11-13 NOTE — Progress Notes (Signed)
   IR Note  Discussed with Dr Darylene Epley Review note in chart-- liver biopsy approved To be done as outpatient  Pt will hear from scheduler for time and date

## 2023-11-13 NOTE — Progress Notes (Addendum)
Subjective: CC: Patients husband at bedside.  Seen with PT.   She reports left rib pain. Her pain is worse with movement. Pain medication is helping but doesn't feel her pain is well controlled. Only used oxy x 1 yesterday and today. She was not aware she had to ask for these. Will see how her pain is today staying ontop of oxy prn.  No sob. On RA. Pulling 750 on IS. Denies other areas of pain today. Tolerating diet without n/v. Voiding without issues. No BM. Thinks she will be able to have a bm after she drinks more coffee. Mobilized with therapies yesterday. Seen walking in the hallways with RW today.    Objective: Vital signs in last 24 hours: Temp:  [97.6 F (36.4 C)-98.4 F (36.9 C)] 97.6 F (36.4 C) (01/15 0737) Pulse Rate:  [68-75] 75 (01/15 0737) Resp:  [16-18] 16 (01/15 0737) BP: (104-138)/(59-80) 138/68 (01/15 0737) SpO2:  [96 %-98 %] 97 % (01/15 0737) Last BM Date : 11/12/23  Intake/Output from previous day: 01/14 0701 - 01/15 0700 In: 720 [P.O.:720] Out: -  Intake/Output this shift: No intake/output data recorded.  PE: Gen:  Alert, NAD, pleasant Card:  RRR. Pulm:  CTAB, no W/R/R, effort normal. On RA. Pulling 750 on IS.  Abd: Soft, ND, NT Psych: A&Ox4 Skin: Warm and dry.  Neuro: Non-focal. MAE's. CN 3-12 grossly intact Msk: No LE edema  Lab Results:  Recent Labs    11/11/23 0936 11/12/23 0605  WBC 11.1* 7.8  HGB 13.3 12.2  HCT 40.2 37.0  PLT 240 218   BMET Recent Labs    11/11/23 0936 11/12/23 0605  NA 139 139  K 4.1 4.3  CL 105 106  CO2 26 26  GLUCOSE 123* 114*  BUN 15 12  CREATININE 0.69 0.78  CALCIUM 9.2 8.6*   PT/INR No results for input(s): "LABPROT", "INR" in the last 72 hours. CMP     Component Value Date/Time   NA 139 11/12/2023 0605   NA 140 04/29/2017 1202   K 4.3 11/12/2023 0605   K 4.2 04/29/2017 1202   CL 106 11/12/2023 0605   CO2 26 11/12/2023 0605   CO2 25 04/29/2017 1202   GLUCOSE 114 (H) 11/12/2023 0605    GLUCOSE 97 04/29/2017 1202   BUN 12 11/12/2023 0605   BUN 19.2 04/29/2017 1202   CREATININE 0.78 11/12/2023 0605   CREATININE 0.74 08/29/2021 1007   CREATININE 0.8 04/29/2017 1202   CALCIUM 8.6 (L) 11/12/2023 0605   CALCIUM 9.8 04/29/2017 1202   PROT 7.0 08/29/2021 1007   PROT 7.3 04/29/2017 1202   ALBUMIN 4.1 08/29/2021 1007   ALBUMIN 4.1 04/29/2017 1202   AST 24 08/29/2021 1007   AST 21 04/29/2017 1202   ALT 21 08/29/2021 1007   ALT 20 04/29/2017 1202   ALKPHOS 67 08/29/2021 1007   ALKPHOS 80 04/29/2017 1202   BILITOT 0.5 08/29/2021 1007   BILITOT 0.56 04/29/2017 1202   GFRNONAA >60 11/12/2023 0605   GFRNONAA >60 08/29/2021 1007   GFRAA >60 08/25/2019 1004   Lipase     Component Value Date/Time   LIPASE 29 11/11/2023 0936    Studies/Results: DG Shoulder Left Port Result Date: 11/12/2023 CLINICAL DATA:  Shoulder pain after recent fall EXAM: LEFT SHOULDER COMPARISON:  CT 11/11/2023 FINDINGS: No acute fracture or malalignment. Mild AC joint degenerative change. Acute displaced left third through eighth posterolateral rib fractures. IMPRESSION: 1. No acute osseous abnormality of the  left shoulder. Minimal calcific tendinopathy. 2. Acute displaced left third through eighth posterolateral rib fractures. Electronically Signed   By: Jasmine Pang M.D.   On: 11/12/2023 15:13   MR ABDOMEN W WO CONTRAST Result Date: 11/12/2023 CLINICAL DATA:  Pancreatic mass on CT EXAM: MRI ABDOMEN WITHOUT AND WITH CONTRAST (INCLUDING MRCP) TECHNIQUE: Multiplanar multisequence MR imaging of the abdomen was performed both before and after the administration of intravenous contrast. Heavily T2-weighted images of the biliary and pancreatic ducts were obtained, and three-dimensional MRCP images were rendered by post processing. CONTRAST:  5mL GADAVIST GADOBUTROL 1 MMOL/ML IV SOLN COMPARISON:  CT abdomen/pelvis dated 11/11/2023 FINDINGS: Lower chest: Trace bilateral pleural effusions with bibasilar atelectasis.  Hepatobiliary: 13 mm rim enhancing lesion along the posterior aspect of segment 4A and 2 (series 14/image 21). Additional 12 mm rim enhancing lesion in segment 4A (series 14/image 26). Subtle 8 mm rim enhancing lesion in segment 7 (series 14/image 30). Additional foci of suspected restricted diffusion (series 6/images 82 and 89). In this clinical context, these are suspicious for metastases. Additional 2.3 cm irregular cyst in segment 8 (series 14/image 30). Gallbladder is unremarkable. No intrahepatic or extrahepatic ductal dilatation. Pancreas: 3.5 x 1.6 cm hypoenhancing mass in the pancreatic tail (series 14/image 47), highly suspicious for pancreatic adenocarcinoma. No associated vascular invasion. Spleen:  Within normal limits. Adrenals/Urinary Tract:  Adrenal glands are within normal limits. Kidneys are within normal limits.  No hydronephrosis. Stomach/Bowel: Stomach is within normal limits. Visualized bowel is grossly unremarkable. Vascular/Lymphatic:  No evidence of abdominal aortic aneurysm. No suspicious abdominal lymphadenopathy. Other:  No abdominal ascites. Musculoskeletal: No focal osseous lesions. IMPRESSION: 3.5 cm mass in the pancreatic tail, highly suspicious for pancreatic adenocarcinoma. No associated vascular invasion. Multifocal small hepatic metastases, as above. Trace bilateral pleural effusions with bibasilar atelectasis. Electronically Signed   By: Charline Bills M.D.   On: 11/12/2023 04:01   MR 3D Recon At Scanner Result Date: 11/12/2023 CLINICAL DATA:  Pancreatic mass on CT EXAM: MRI ABDOMEN WITHOUT AND WITH CONTRAST (INCLUDING MRCP) TECHNIQUE: Multiplanar multisequence MR imaging of the abdomen was performed both before and after the administration of intravenous contrast. Heavily T2-weighted images of the biliary and pancreatic ducts were obtained, and three-dimensional MRCP images were rendered by post processing. CONTRAST:  5mL GADAVIST GADOBUTROL 1 MMOL/ML IV SOLN COMPARISON:   CT abdomen/pelvis dated 11/11/2023 FINDINGS: Lower chest: Trace bilateral pleural effusions with bibasilar atelectasis. Hepatobiliary: 13 mm rim enhancing lesion along the posterior aspect of segment 4A and 2 (series 14/image 21). Additional 12 mm rim enhancing lesion in segment 4A (series 14/image 26). Subtle 8 mm rim enhancing lesion in segment 7 (series 14/image 30). Additional foci of suspected restricted diffusion (series 6/images 82 and 89). In this clinical context, these are suspicious for metastases. Additional 2.3 cm irregular cyst in segment 8 (series 14/image 30). Gallbladder is unremarkable. No intrahepatic or extrahepatic ductal dilatation. Pancreas: 3.5 x 1.6 cm hypoenhancing mass in the pancreatic tail (series 14/image 47), highly suspicious for pancreatic adenocarcinoma. No associated vascular invasion. Spleen:  Within normal limits. Adrenals/Urinary Tract:  Adrenal glands are within normal limits. Kidneys are within normal limits.  No hydronephrosis. Stomach/Bowel: Stomach is within normal limits. Visualized bowel is grossly unremarkable. Vascular/Lymphatic:  No evidence of abdominal aortic aneurysm. No suspicious abdominal lymphadenopathy. Other:  No abdominal ascites. Musculoskeletal: No focal osseous lesions. IMPRESSION: 3.5 cm mass in the pancreatic tail, highly suspicious for pancreatic adenocarcinoma. No associated vascular invasion. Multifocal small hepatic metastases, as above. Trace bilateral  pleural effusions with bibasilar atelectasis. Electronically Signed   By: Charline Bills M.D.   On: 11/12/2023 04:01   CT CHEST ABDOMEN PELVIS W CONTRAST Result Date: 11/11/2023 CLINICAL DATA:  Trauma.  Mechanical fall.  Chest wall pain. EXAM: CT CHEST, ABDOMEN, AND PELVIS WITH CONTRAST TECHNIQUE: Multidetector CT imaging of the chest, abdomen and pelvis was performed following the standard protocol during bolus administration of intravenous contrast. RADIATION DOSE REDUCTION: This exam was  performed according to the departmental dose-optimization program which includes automated exposure control, adjustment of the mA and/or kV according to patient size and/or use of iterative reconstruction technique. CONTRAST:  80mL OMNIPAQUE IOHEXOL 300 MG/ML  SOLN COMPARISON:  None Available. FINDINGS: CT CHEST FINDINGS Cardiovascular: There is no cardiomegaly or pericardial effusion. Mild atherosclerotic calcification of the thoracic aorta. No aneurysmal dilatation or dissection. The origins of the great vessels of the aortic arch and the central pulmonary arteries appear patent. Mediastinum/Nodes: No hilar or mediastinal adenopathy. The esophagus is grossly unremarkable. No mediastinal fluid collection. Lungs/Pleura: Trace left pleural effusion. There are bibasilar subpleural atelectasis. No focal consolidation or pneumothorax. The central airways are patent. Musculoskeletal: Displaced fracture of the posterior left fourth rib. Nondisplaced fracture of the posterior left fifth rib and mildly displaced fractures of the lateral fifth rib. Displaced fractures of the lateral 6-9th ribs. CT ABDOMEN PELVIS FINDINGS No intra-abdominal free air or free fluid. Hepatobiliary: Indeterminate 2 cm hypodense lesion in the right lobe of the liver may represent an area of scarring or a cyst. A neoplastic process or metastatic disease is not excluded. Additional smaller hypodense lesion in the dome of the liver is also not evaluated on this CT. No biliary dilatation. The gallbladder is unremarkable. Pancreas: There is a 1.6 x 3.6 cm hypoenhancing lesion in the body of the pancreas most consistent with malignancy. There is atrophy of the tail of the pancreas. No dilatation of the main pancreatic duct. No active inflammatory changes. Spleen: Normal in size without focal abnormality. Adrenals/Urinary Tract: The adrenal glands unremarkable. The kidneys, visualized ureters, and urinary bladder appear unremarkable. Stomach/Bowel: There  is no bowel obstruction or active inflammation. The appendix is normal. Vascular/Lymphatic: Mild aortoiliac atherosclerotic disease. The IVC is unremarkable. No portal venous gas. There is no adenopathy. Reproductive: The uterus is anteverted and grossly unremarkable. No adnexal masses. Other: Small fat containing umbilical hernia. Musculoskeletal: Degenerative changes of the lower lumbar spine. No acute osseous pathology. IMPRESSION: 1. Displaced fractures of the left fourth through ninth ribs. No pneumothorax. 2. 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 Further evaluation with MRI without and with contrast is recommended. 3. Indeterminate hypodense lesions in the liver. Attention on MRI recommended. 4. No bowel obstruction. Normal appendix. 5.  Aortic Atherosclerosis (ICD10-I70.0). Electronically Signed   By: Elgie Collard M.D.   On: 11/11/2023 11:28   CT Head Wo Contrast Result Date: 11/11/2023 CLINICAL DATA:  Neck trauma (Age >= 65y) Fall down stairs, hit head, neck pain with movement; Head trauma, minor (Age >= 65y) Fall down four stairs, hit head, possible LOC. Hx of chiari malformation s/p surgical repair EXAM: CT HEAD WITHOUT CONTRAST CT CERVICAL SPINE WITHOUT CONTRAST TECHNIQUE: Multidetector CT imaging of the head and cervical spine was performed following the standard protocol without intravenous contrast. Multiplanar CT image reconstructions of the cervical spine were also generated. RADIATION DOSE REDUCTION: This exam was performed according to the departmental dose-optimization program which includes automated exposure control, adjustment of the mA and/or kV according to patient size and/or use  of iterative reconstruction technique. COMPARISON:  MRI head January 05, 2012. FINDINGS: CT HEAD FINDINGS Brain: Posterior decompression for Chiari malformation without substantial crowding at the foramen magnum. No evidence of acute large vascular territory infarct, acute hemorrhage,  mass lesion or midline shift. Vascular: No hyperdense vessel. Skull: No acute fracture. Sinuses/Orbits: Clear sinuses.  No acute orbital findings. Other: No mastoid effusions. CT CERVICAL SPINE FINDINGS Alignment: Straightening. Mild anterolisthesis of C4 on C5, likely degenerative. Otherwise, no substantial sagittal subluxation. Skull base and vertebrae: No acute fracture. Vertebral body heights are maintained. Soft tissues and spinal canal: No prevertebral fluid or swelling. No visible canal hematoma. Disc levels: Moderate to severe multilevel degenerative change, greatest at C3-C4 and C5-C6. Upper chest: Visualized lung apices are clear. IMPRESSION: 1. No evidence of acute abnormality intracranially or in the cervical spine. 2. Posterior decompression for Chiari malformation without substantial crowding at the foramen magnum. Electronically Signed   By: Feliberto Harts M.D.   On: 11/11/2023 11:11   CT Cervical Spine Wo Contrast Result Date: 11/11/2023 CLINICAL DATA:  Neck trauma (Age >= 65y) Fall down stairs, hit head, neck pain with movement; Head trauma, minor (Age >= 65y) Fall down four stairs, hit head, possible LOC. Hx of chiari malformation s/p surgical repair EXAM: CT HEAD WITHOUT CONTRAST CT CERVICAL SPINE WITHOUT CONTRAST TECHNIQUE: Multidetector CT imaging of the head and cervical spine was performed following the standard protocol without intravenous contrast. Multiplanar CT image reconstructions of the cervical spine were also generated. RADIATION DOSE REDUCTION: This exam was performed according to the departmental dose-optimization program which includes automated exposure control, adjustment of the mA and/or kV according to patient size and/or use of iterative reconstruction technique. COMPARISON:  MRI head January 05, 2012. FINDINGS: CT HEAD FINDINGS Brain: Posterior decompression for Chiari malformation without substantial crowding at the foramen magnum. No evidence of acute large vascular  territory infarct, acute hemorrhage, mass lesion or midline shift. Vascular: No hyperdense vessel. Skull: No acute fracture. Sinuses/Orbits: Clear sinuses.  No acute orbital findings. Other: No mastoid effusions. CT CERVICAL SPINE FINDINGS Alignment: Straightening. Mild anterolisthesis of C4 on C5, likely degenerative. Otherwise, no substantial sagittal subluxation. Skull base and vertebrae: No acute fracture. Vertebral body heights are maintained. Soft tissues and spinal canal: No prevertebral fluid or swelling. No visible canal hematoma. Disc levels: Moderate to severe multilevel degenerative change, greatest at C3-C4 and C5-C6. Upper chest: Visualized lung apices are clear. IMPRESSION: 1. No evidence of acute abnormality intracranially or in the cervical spine. 2. Posterior decompression for Chiari malformation without substantial crowding at the foramen magnum. Electronically Signed   By: Feliberto Harts M.D.   On: 11/11/2023 11:11    Anti-infectives: Anti-infectives (From admission, onward)    None        Assessment/Plan Cindy Arias is an 78 y.o. female who presented as a trauma transfer after a fall down 4 steps on 1/12 Left 4-9 Rib fractures - Multimodal pain control, pulmonary toilet Cephalohematoma - Local care L shoulder pain - xray negative for fx Incidental pancreatic mass and liver lesion on CT - MRI w/ 3.5 x 1.6 cm mass in the pancreatic tail, highly suspicious for pancreatic adenocarcinoma with areas suspicious for multifocal small hepatic metastases  There is no associated vascular invasion. She had a CT CAP on admission. CA 19-9 13. Reviewed with one of our hepatobiliary surgeons - recommended medical oncology consult. Dr. Mosetta Putt has seen and will follow as outpatient. Plans for IR liver bx for tissue dx. This  will be done as an outpatient - I called and confirmed this with IR today.  Hx Asthma - prn levalbuterol  Hx UC - home mesalamine  Hx Chiari Malformation s/p  surgical repair  FEN - Reg, SLIV VTE - SCDs, Lovenox ID - None Foley - None, spont void.  Plan - If patient mobilizing well with therapies and pain well controlled today, will plan for d/c this afternoon. TOC consult for HH.   I reviewed nursing notes, last 24 h vitals and pain scores, last 48 h intake and output, last 24 h labs and trends, and last 24 h imaging results.   LOS: 2 days    Jacinto Halim , [facility] 11/13/2023, 10:14 AM Please see Amion for pager number during day hours 7:00am-4:30pm

## 2023-11-13 NOTE — Progress Notes (Signed)
 Pt discharged home with spouse in stable condition. Discharge instructions and discharge meds from [facility] given to patient. Pt verbalized understanding of dc instructions.  No immediate questions or concerns. Discharged from unit via wheelchair.

## 2023-11-13 NOTE — Telephone Encounter (Signed)
 Pharmacy Patient Advocate Encounter  Received notification from [employer] that Prior Authorization for Lidocaine  5% patches  has been APPROVED from 11/13/2023 to 10/28/2024   PA #/Case ID/Reference #: [id number]

## 2023-11-13 NOTE — Plan of Care (Signed)
 This Friday, 11/15/23,  patient is to be here at [facility] at 8 am.    Liver biopsy procedure is scheduled for 10 am.    DO NOT EAT OR DRINK after MN on Thursday night so sedation can be given.    NO Blood thinners (Aspirin , Plavix, Brilinta, Eliquis, Xarelto, Lovenox , Heparin , Warfarin, etc).   Someone will need to drive her and be with her 24 hours after procedure.   She will see an IR Provider Friday morning to explain the procedure.    Marili Vader S Jena Tegeler PA-C 11/13/2023 11:28 AM

## 2023-11-14 ENCOUNTER — Other Ambulatory Visit (HOSPITAL_COMMUNITY): Payer: Self-pay | Admitting: Student

## 2023-11-14 ENCOUNTER — Telehealth: Payer: Self-pay | Admitting: Hematology

## 2023-11-14 DIAGNOSIS — K8689 Other specified diseases of pancreas: Secondary | ICD-10-CM

## 2023-11-14 NOTE — Telephone Encounter (Signed)
Scheduled appointment per staff message. Patient is aware of the made appointments.

## 2023-11-15 ENCOUNTER — Ambulatory Visit (HOSPITAL_COMMUNITY)
Admission: RE | Admit: 2023-11-15 | Discharge: 2023-11-15 | Disposition: A | Discharge status: Home / Self Care | Payer: Medicare Other | Source: Ambulatory Visit | Attending: Hematology

## 2023-11-15 ENCOUNTER — Other Ambulatory Visit: Payer: Self-pay

## 2023-11-15 DIAGNOSIS — K769 Liver disease, unspecified: Secondary | ICD-10-CM | POA: Insufficient documentation

## 2023-11-15 DIAGNOSIS — R896 Abnormal cytological findings in specimens from other organs, systems and tissues: Secondary | ICD-10-CM | POA: Diagnosis not present

## 2023-11-15 DIAGNOSIS — Z923 Personal history of irradiation: Secondary | ICD-10-CM | POA: Diagnosis not present

## 2023-11-15 DIAGNOSIS — K8689 Other specified diseases of pancreas: Secondary | ICD-10-CM

## 2023-11-15 DIAGNOSIS — Z96652 Presence of left artificial knee joint: Secondary | ICD-10-CM | POA: Diagnosis not present

## 2023-11-15 DIAGNOSIS — Z853 Personal history of malignant neoplasm of breast: Secondary | ICD-10-CM | POA: Insufficient documentation

## 2023-11-15 DIAGNOSIS — K869 Disease of pancreas, unspecified: Secondary | ICD-10-CM | POA: Diagnosis not present

## 2023-11-15 LAB — CBC
HCT: 41.1 % (ref 36.0–46.0)
Hemoglobin: 13.5 g/dL (ref 12.0–15.0)
MCH: 29.2 pg (ref 26.0–34.0)
MCHC: 32.8 g/dL (ref 30.0–36.0)
MCV: 88.8 fL (ref 80.0–100.0)
Platelets: 251 10*3/uL (ref 150–400)
RBC: 4.63 MIL/uL (ref 3.87–5.11)
RDW: 13.4 % (ref 11.5–15.5)
WBC: 7.4 10*3/uL (ref 4.0–10.5)
nRBC: 0 % (ref 0.0–0.2)

## 2023-11-15 LAB — PROTIME-INR
INR: 1.1 (ref 0.8–1.2)
Prothrombin Time: 13.9 s (ref 11.4–15.2)

## 2023-11-15 MED ORDER — FENTANYL CITRATE (PF) 100 MCG/2ML IJ SOLN
INTRAMUSCULAR | Status: AC | PRN
  Administered 2023-11-15 (×2): 50 ug via INTRAVENOUS

## 2023-11-15 MED ORDER — FENTANYL CITRATE (PF) 100 MCG/2ML IJ SOLN
INTRAMUSCULAR | Status: AC
  Filled 2023-11-15: qty 2

## 2023-11-15 MED ORDER — MIDAZOLAM HCL 2 MG/2ML IJ SOLN
INTRAMUSCULAR | Status: AC | PRN
  Administered 2023-11-15 (×2): 1 mg via INTRAVENOUS

## 2023-11-15 MED ORDER — ACETAMINOPHEN 325 MG PO TABS
650.0000 mg | ORAL_TABLET | Freq: Once | ORAL | Status: AC | PRN
  Administered 2023-11-15: 650 mg via ORAL

## 2023-11-15 MED ORDER — MIDAZOLAM HCL 2 MG/2ML IJ SOLN
INTRAMUSCULAR | Status: AC
  Filled 2023-11-15: qty 2

## 2023-11-15 MED ORDER — LIDOCAINE HCL (PF) 1 % IJ SOLN
10.0000 mL | Freq: Once | INTRAMUSCULAR | Status: AC
  Administered 2023-11-15: 10 mL
  Filled 2023-11-15: qty 10

## 2023-11-15 MED ORDER — ACETAMINOPHEN 325 MG PO TABS
ORAL_TABLET | ORAL | Status: AC
  Filled 2023-11-15: qty 2

## 2023-11-15 NOTE — H&P (Signed)
Chief Complaint: Patient was seen in consultation today for indeterminate hypodense lesions in the liver  Procedure: Liver Lesion Biopsy   Referring Physician(s): Feng,Yan  Supervising Physician: Gilmer Mor  Patient Status: [facility] - Out-pt  History of Present Illness: Cindy Arias is a 78 y.o. female with a history of OA, left knee replacement, and right breast cancer s/p lumpectomy and radiation presenting for a liver lesion biopsy. Patient with recent admission to [facility] ED following a fall at home (11/11/23-11/13/23). During her workup, CT A/P revealed hypoenhancing pancreatic mass concerning for malignancy with indeterminate hypodense lesions in the liver. Oncology workup with Dr. Mosetta Putt. IR consulted for liver biopsy.   Patient resting comfortably in bed on arrival to room. She has no complaints. She is not on any blood thinners and has been NPO since midnight. She is afebrile. Recent CBC (11/12/23) unremarkable and INR pending.   Code Status: Full Code  Past Medical History:  Diagnosis Date   Arthritis    "bone on bone'  left knee   Asthma    LAST FLARE UP 3 YRS AGO   Breast cancer (HCC)    RIGHT BREAST   Colitis    Dysphagia    Dyspnea    NONE IN 3 YRS   Esophageal stricture    GERD (gastroesophageal reflux disease)    History of infertility    History of radiation therapy 12/31/16- 01/21/17   Right Breast 42.56 Gy in 16 fractions   Osteopenia    Pneumonia    Rosacea    Ulcerative colitis (HCC)     Past Surgical History:  Procedure Laterality Date   BREAST LUMPECTOMY WITH RADIOACTIVE SEED AND SENTINEL LYMPH NODE BIOPSY Right 11/21/2016   Procedure: BREAST LUMPECTOMY WITH RADIOACTIVE SEED AND SENTINEL LYMPH NODE BIOPSY;  Surgeon: Glenna Fellows, MD;  Location: [facility];  Service: General;  Laterality: Right;   BREAST SURGERY     RIGHT BREAST LUMPECTOMY   BUNIONECTOMY WITH HAMMERTOE RECONSTRUCTION Left 07/2014   CATARACT EXTRACTION  Right 10/2015   ESOPHAGEAL DILATION     EYE SURGERY     OD   JOINT REPLACEMENT     Repair of Chiari Malformation  02/05/13   SUBOCCIPITAL CRANIECTOMY CERVICAL LAMINECTOMY  02/13/2012   Procedure: SUBOCCIPITAL CRANIECTOMY CERVICAL LAMINECTOMY/DURAPLASTY;  Surgeon: Cristi Loron, MD;  Location: [facility];  Service: Neurosurgery;  Laterality: N/A;  Suboccipital craniectomy with upper cervical laminectomy and duraplasty   TOTAL KNEE ARTHROPLASTY Left 11/04/2018   TOTAL KNEE ARTHROPLASTY Left 11/04/2018   Procedure: LEFT TOTAL KNEE ARTHROPLASTY;  Surgeon: Valeria Batman, MD;  Location: [facility] OR;  Service: Orthopedics;  Laterality: Left;   TUBAL LIGATION  1987    Allergies: Penicillins, Sulfa antibiotics, and Albuterol  Medications: Prior to Admission medications   Medication Sig Start Date End Date Taking? Authorizing Provider  acetaminophen (TYLENOL) 500 MG tablet Take 2 tablets (1,000 mg total) by mouth every 8 (eight) hours as needed. 11/13/23  Yes Maczis, Elmer Sow, PA-C  budesonide-formoterol [facility]) 160-4.5 MCG/ACT inhaler Inhale 2 puffs into the lungs in the morning and at bedtime. 08/06/23  Yes Kozlow, Alvira Philips, MD  CALCIUM CITRATE-VITAMIN D PO Take 1 tablet by mouth daily.    Yes [provider]  docusate sodium (COLACE) 100 MG capsule Take 1 capsule (100 mg total) by mouth 2 (two) times daily as needed for mild constipation. 11/13/23  Yes Maczis, Elmer Sow, PA-C  fluticasone (FLONASE) 50 MCG/ACT nasal spray Place 2  sprays into both nostrils daily as needed for allergies or rhinitis.   Yes [provider]  lidocaine (LIDODERM) 5 % Place 1 patch onto the skin daily. Remove & Discard patch within 12 hours or as directed by MD 11/14/23  Yes Maczis, Elmer Sow, PA-C  mesalamine (LIALDA) 1.2 G EC tablet Take 1.2 g by mouth daily with breakfast.    Yes [provider]  methocarbamol (ROBAXIN) 750 MG tablet Take 1 tablet (750 mg total) by mouth every 8 (eight)  hours as needed for muscle spasms. 11/13/23  Yes Maczis, Elmer Sow, PA-C  Multiple Vitamins-Iron (MULTIVITAMINS WITH IRON) TABS Take 1 tablet by mouth daily.   Yes [provider]  omeprazole (PRILOSEC) 40 MG capsule Take 1 capsule (40 mg total) by mouth daily. Take 1 capsule by mouth 1-2 times a day 08/06/23  Yes Kozlow, Alvira Philips, MD  oxyCODONE (OXY IR/ROXICODONE) 5 MG immediate release tablet Take 1-1.5 tablets (5-7.5 mg total) by mouth every 6 (six) hours as needed for breakthrough pain. 11/13/23  Yes Maczis, Elmer Sow, PA-C  vitamin E 400 UNIT capsule Take 400 Units by mouth daily.   Yes [provider]  levalbuterol (XOPENEX HFA) 45 MCG/ACT inhaler Inhale 2 puffs into the lungs See admin instructions. Every 4-6 hours prn for sob, cough    [provider]  polyethylene glycol (MIRALAX / GLYCOLAX) 17 g packet Take 17 g by mouth 2 (two) times daily as needed. 11/13/23   Maczis, Elmer Sow, PA-C     Family History  Problem Relation Age of Onset   Cancer Mother        Liver- 2003 Lymphoma   Rheum arthritis Mother    Stroke Father    Diabetes Maternal Grandfather        Type 2    Heart disease Maternal Grandfather    Hypertension Maternal Grandmother    Heart disease Maternal Grandmother    Osteoporosis Maternal Grandmother    Anesthesia problems Neg Hx     Social History   Socioeconomic History   Marital status: Married    Spouse name: Not on file   Number of children: Not on file   Years of education: Not on file   Highest education level: Not on file  Occupational History   Not on file  Tobacco Use   Smoking status: Never   Smokeless tobacco: Never   Tobacco comments:    she smoked for a few months in college  Vaping Use   Vaping status: Never Used  Substance and Sexual Activity   Alcohol use: Yes    Comment: 1 glass of wine monthly   Drug use: No    Comment: Smoked in college ~ 2 years   Sexual activity: Not on file  Other Topics Concern   Not on  file  Social History Narrative   Not on file   Social Drivers of Health   Financial Resource Strain: Not on file  Food Insecurity: No Food Insecurity (11/11/2023)   Hunger Vital Sign    Worried About Running Out of Food in the Last Year: Never true    Ran Out of Food in the Last Year: Never true  Transportation Needs: No Transportation Needs (11/11/2023)   PRAPARE - Administrator, Civil Service (Medical): No    Lack of Transportation (Non-Medical): No  Physical Activity: Not on file  Stress: Not on file  Social Connections: Socially Integrated (11/11/2023)   Social Connection and Isolation Panel [  NHANES]    Frequency of Communication with Friends and Family: Three times a week    Frequency of Social Gatherings with Friends and Family: Three times a week    Attends Religious Services: More than 4 times per year    Active Member of Clubs or Organizations: Yes    Attends Banker Meetings: More than 4 times per year    Marital Status: Married    Review of Systems  Constitutional:  Positive for appetite change (Decreased appetite).  Denies any N/V, chest pain, shortness of breath, fevers/chills. All other ROS negative.  Vital Signs: BP 138/78   Pulse 76   Temp 98.1 F (36.7 C) (Oral)   Resp 18   Ht 5' (1.524 m)   Wt 112 lb (50.8 kg)   LMP 02/13/2012   SpO2 96%   BMI 21.87 kg/m    Physical Exam Vitals reviewed.  Constitutional:      Appearance: Normal appearance. She is normal weight.  HENT:     Head: Normocephalic and atraumatic.     Mouth/Throat:     Mouth: Mucous membranes are moist.     Pharynx: Oropharynx is clear.  Cardiovascular:     Rate and Rhythm: Normal rate and regular rhythm.     Heart sounds: Normal heart sounds.  Pulmonary:     Effort: Pulmonary effort is normal.     Breath sounds: Normal breath sounds.  Abdominal:     General: Abdomen is flat.     Palpations: Abdomen is soft.  Musculoskeletal:        General: Normal range  of motion.     Cervical back: Normal range of motion.  Skin:    General: Skin is warm and dry.  Neurological:     General: No focal deficit present.     Mental Status: She is alert and oriented to person, place, and time. Mental status is at baseline.  Psychiatric:        Mood and Affect: Mood normal.        Behavior: Behavior normal.        Judgment: Judgment normal.    Imaging: DG Shoulder Left Port Result Date: 11/12/2023 CLINICAL DATA:  Shoulder pain after recent fall EXAM: LEFT SHOULDER COMPARISON:  CT 11/11/2023 FINDINGS: No acute fracture or malalignment. Mild AC joint degenerative change. Acute displaced left third through eighth posterolateral rib fractures. IMPRESSION: 1. No acute osseous abnormality of the left shoulder. Minimal calcific tendinopathy. 2. Acute displaced left third through eighth posterolateral rib fractures. Electronically Signed   By: Jasmine Pang M.D.   On: 11/12/2023 15:13   MR ABDOMEN W WO CONTRAST Result Date: 11/12/2023 CLINICAL DATA:  Pancreatic mass on CT EXAM: MRI ABDOMEN WITHOUT AND WITH CONTRAST (INCLUDING MRCP) TECHNIQUE: Multiplanar multisequence MR imaging of the abdomen was performed both before and after the administration of intravenous contrast. Heavily T2-weighted images of the biliary and pancreatic ducts were obtained, and three-dimensional MRCP images were rendered by post processing. CONTRAST:  5mL GADAVIST GADOBUTROL 1 MMOL/ML IV SOLN COMPARISON:  CT abdomen/pelvis dated 11/11/2023 FINDINGS: Lower chest: Trace bilateral pleural effusions with bibasilar atelectasis. Hepatobiliary: 13 mm rim enhancing lesion along the posterior aspect of segment 4A and 2 (series 14/image 21). Additional 12 mm rim enhancing lesion in segment 4A (series 14/image 26). Subtle 8 mm rim enhancing lesion in segment 7 (series 14/image 30). Additional foci of suspected restricted diffusion (series 6/images 82 and 89). In this clinical context, these are suspicious for  metastases.  Additional 2.3 cm irregular cyst in segment 8 (series 14/image 30). Gallbladder is unremarkable. No intrahepatic or extrahepatic ductal dilatation. Pancreas: 3.5 x 1.6 cm hypoenhancing mass in the pancreatic tail (series 14/image 47), highly suspicious for pancreatic adenocarcinoma. No associated vascular invasion. Spleen:  Within normal limits. Adrenals/Urinary Tract:  Adrenal glands are within normal limits. Kidneys are within normal limits.  No hydronephrosis. Stomach/Bowel: Stomach is within normal limits. Visualized bowel is grossly unremarkable. Vascular/Lymphatic:  No evidence of abdominal aortic aneurysm. No suspicious abdominal lymphadenopathy. Other:  No abdominal ascites. Musculoskeletal: No focal osseous lesions. IMPRESSION: 3.5 cm mass in the pancreatic tail, highly suspicious for pancreatic adenocarcinoma. No associated vascular invasion. Multifocal small hepatic metastases, as above. Trace bilateral pleural effusions with bibasilar atelectasis. Electronically Signed   By: Charline Bills M.D.   On: 11/12/2023 04:01   MR 3D Recon At Scanner Result Date: 11/12/2023 CLINICAL DATA:  Pancreatic mass on CT EXAM: MRI ABDOMEN WITHOUT AND WITH CONTRAST (INCLUDING MRCP) TECHNIQUE: Multiplanar multisequence MR imaging of the abdomen was performed both before and after the administration of intravenous contrast. Heavily T2-weighted images of the biliary and pancreatic ducts were obtained, and three-dimensional MRCP images were rendered by post processing. CONTRAST:  5mL GADAVIST GADOBUTROL 1 MMOL/ML IV SOLN COMPARISON:  CT abdomen/pelvis dated 11/11/2023 FINDINGS: Lower chest: Trace bilateral pleural effusions with bibasilar atelectasis. Hepatobiliary: 13 mm rim enhancing lesion along the posterior aspect of segment 4A and 2 (series 14/image 21). Additional 12 mm rim enhancing lesion in segment 4A (series 14/image 26). Subtle 8 mm rim enhancing lesion in segment 7 (series 14/image 30). Additional  foci of suspected restricted diffusion (series 6/images 82 and 89). In this clinical context, these are suspicious for metastases. Additional 2.3 cm irregular cyst in segment 8 (series 14/image 30). Gallbladder is unremarkable. No intrahepatic or extrahepatic ductal dilatation. Pancreas: 3.5 x 1.6 cm hypoenhancing mass in the pancreatic tail (series 14/image 47), highly suspicious for pancreatic adenocarcinoma. No associated vascular invasion. Spleen:  Within normal limits. Adrenals/Urinary Tract:  Adrenal glands are within normal limits. Kidneys are within normal limits.  No hydronephrosis. Stomach/Bowel: Stomach is within normal limits. Visualized bowel is grossly unremarkable. Vascular/Lymphatic:  No evidence of abdominal aortic aneurysm. No suspicious abdominal lymphadenopathy. Other:  No abdominal ascites. Musculoskeletal: No focal osseous lesions. IMPRESSION: 3.5 cm mass in the pancreatic tail, highly suspicious for pancreatic adenocarcinoma. No associated vascular invasion. Multifocal small hepatic metastases, as above. Trace bilateral pleural effusions with bibasilar atelectasis. Electronically Signed   By: Charline Bills M.D.   On: 11/12/2023 04:01   CT CHEST ABDOMEN PELVIS W CONTRAST Result Date: 11/11/2023 CLINICAL DATA:  Trauma.  Mechanical fall.  Chest wall pain. EXAM: CT CHEST, ABDOMEN, AND PELVIS WITH CONTRAST TECHNIQUE: Multidetector CT imaging of the chest, abdomen and pelvis was performed following the standard protocol during bolus administration of intravenous contrast. RADIATION DOSE REDUCTION: This exam was performed according to the departmental dose-optimization program which includes automated exposure control, adjustment of the mA and/or kV according to patient size and/or use of iterative reconstruction technique. CONTRAST:  80mL OMNIPAQUE IOHEXOL 300 MG/ML  SOLN COMPARISON:  None Available. FINDINGS: CT CHEST FINDINGS Cardiovascular: There is no cardiomegaly or pericardial effusion.  Mild atherosclerotic calcification of the thoracic aorta. No aneurysmal dilatation or dissection. The origins of the great vessels of the aortic arch and the central pulmonary arteries appear patent. Mediastinum/Nodes: No hilar or mediastinal adenopathy. The esophagus is grossly unremarkable. No mediastinal fluid collection. Lungs/Pleura: Trace left pleural effusion. There are  bibasilar subpleural atelectasis. No focal consolidation or pneumothorax. The central airways are patent. Musculoskeletal: Displaced fracture of the posterior left fourth rib. Nondisplaced fracture of the posterior left fifth rib and mildly displaced fractures of the lateral fifth rib. Displaced fractures of the lateral 6-9th ribs. CT ABDOMEN PELVIS FINDINGS No intra-abdominal free air or free fluid. Hepatobiliary: Indeterminate 2 cm hypodense lesion in the right lobe of the liver may represent an area of scarring or a cyst. A neoplastic process or metastatic disease is not excluded. Additional smaller hypodense lesion in the dome of the liver is also not evaluated on this CT. No biliary dilatation. The gallbladder is unremarkable. Pancreas: There is a 1.6 x 3.6 cm hypoenhancing lesion in the body of the pancreas most consistent with malignancy. There is atrophy of the tail of the pancreas. No dilatation of the main pancreatic duct. No active inflammatory changes. Spleen: Normal in size without focal abnormality. Adrenals/Urinary Tract: The adrenal glands unremarkable. The kidneys, visualized ureters, and urinary bladder appear unremarkable. Stomach/Bowel: There is no bowel obstruction or active inflammation. The appendix is normal. Vascular/Lymphatic: Mild aortoiliac atherosclerotic disease. The IVC is unremarkable. No portal venous gas. There is no adenopathy. Reproductive: The uterus is anteverted and grossly unremarkable. No adnexal masses. Other: Small fat containing umbilical hernia. Musculoskeletal: Degenerative changes of the lower  lumbar spine. No acute osseous pathology. IMPRESSION: 1. Displaced fractures of the left fourth through ninth ribs. No pneumothorax. 2. 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 Further evaluation with MRI without and with contrast is recommended. 3. Indeterminate hypodense lesions in the liver. Attention on MRI recommended. 4. No bowel obstruction. Normal appendix. 5.  Aortic Atherosclerosis (ICD10-I70.0). Electronically Signed   By: Elgie Collard M.D.   On: 11/11/2023 11:28   CT Head Wo Contrast Result Date: 11/11/2023 CLINICAL DATA:  Neck trauma (Age >= 65y) Fall down stairs, hit head, neck pain with movement; Head trauma, minor (Age >= 65y) Fall down four stairs, hit head, possible LOC. Hx of chiari malformation s/p surgical repair EXAM: CT HEAD WITHOUT CONTRAST CT CERVICAL SPINE WITHOUT CONTRAST TECHNIQUE: Multidetector CT imaging of the head and cervical spine was performed following the standard protocol without intravenous contrast. Multiplanar CT image reconstructions of the cervical spine were also generated. RADIATION DOSE REDUCTION: This exam was performed according to the departmental dose-optimization program which includes automated exposure control, adjustment of the mA and/or kV according to patient size and/or use of iterative reconstruction technique. COMPARISON:  MRI head January 05, 2012. FINDINGS: CT HEAD FINDINGS Brain: Posterior decompression for Chiari malformation without substantial crowding at the foramen magnum. No evidence of acute large vascular territory infarct, acute hemorrhage, mass lesion or midline shift. Vascular: No hyperdense vessel. Skull: No acute fracture. Sinuses/Orbits: Clear sinuses.  No acute orbital findings. Other: No mastoid effusions. CT CERVICAL SPINE FINDINGS Alignment: Straightening. Mild anterolisthesis of C4 on C5, likely degenerative. Otherwise, no substantial sagittal subluxation. Skull base and vertebrae: No acute fracture. Vertebral  body heights are maintained. Soft tissues and spinal canal: No prevertebral fluid or swelling. No visible canal hematoma. Disc levels: Moderate to severe multilevel degenerative change, greatest at C3-C4 and C5-C6. Upper chest: Visualized lung apices are clear. IMPRESSION: 1. No evidence of acute abnormality intracranially or in the cervical spine. 2. Posterior decompression for Chiari malformation without substantial crowding at the foramen magnum. Electronically Signed   By: Feliberto Harts M.D.   On: 11/11/2023 11:11   CT Cervical Spine Wo Contrast Result Date: 11/11/2023 CLINICAL DATA:  Neck trauma (Age >= 65y) Fall down stairs, hit head, neck pain with movement; Head trauma, minor (Age >= 65y) Fall down four stairs, hit head, possible LOC. Hx of chiari malformation s/p surgical repair EXAM: CT HEAD WITHOUT CONTRAST CT CERVICAL SPINE WITHOUT CONTRAST TECHNIQUE: Multidetector CT imaging of the head and cervical spine was performed following the standard protocol without intravenous contrast. Multiplanar CT image reconstructions of the cervical spine were also generated. RADIATION DOSE REDUCTION: This exam was performed according to the departmental dose-optimization program which includes automated exposure control, adjustment of the mA and/or kV according to patient size and/or use of iterative reconstruction technique. COMPARISON:  MRI head January 05, 2012. FINDINGS: CT HEAD FINDINGS Brain: Posterior decompression for Chiari malformation without substantial crowding at the foramen magnum. No evidence of acute large vascular territory infarct, acute hemorrhage, mass lesion or midline shift. Vascular: No hyperdense vessel. Skull: No acute fracture. Sinuses/Orbits: Clear sinuses.  No acute orbital findings. Other: No mastoid effusions. CT CERVICAL SPINE FINDINGS Alignment: Straightening. Mild anterolisthesis of C4 on C5, likely degenerative. Otherwise, no substantial sagittal subluxation. Skull base and  vertebrae: No acute fracture. Vertebral body heights are maintained. Soft tissues and spinal canal: No prevertebral fluid or swelling. No visible canal hematoma. Disc levels: Moderate to severe multilevel degenerative change, greatest at C3-C4 and C5-C6. Upper chest: Visualized lung apices are clear. IMPRESSION: 1. No evidence of acute abnormality intracranially or in the cervical spine. 2. Posterior decompression for Chiari malformation without substantial crowding at the foramen magnum. Electronically Signed   By: Feliberto Harts M.D.   On: 11/11/2023 11:11    Labs:  CBC: Recent Labs    11/11/23 0936 11/12/23 0605  WBC 11.1* 7.8  HGB 13.3 12.2  HCT 40.2 37.0  PLT 240 218    COAGS: No results for input(s): "INR", "APTT" in the last 8760 hours.  BMP: Recent Labs    11/11/23 0936 11/12/23 0605  NA 139 139  K 4.1 4.3  CL 105 106  CO2 26 26  GLUCOSE 123* 114*  BUN 15 12  CALCIUM 9.2 8.6*  CREATININE 0.69 0.78  GFRNONAA >60 >60    LIVER FUNCTION TESTS: No results for input(s): "BILITOT", "AST", "ALT", "ALKPHOS", "PROT", "ALBUMIN" in the last 8760 hours.  TUMOR MARKERS: No results for input(s): "AFPTM", "CEA", "CA199", "CHROMGRNA" in the last 8760 hours.  Assessment and Plan:  78 y.o. female with a history of OA, left knee replacement, and right breast cancer s/p lumpectomy and radiation presenting for a liver lesion biopsy. Patient with recent admission to [facility] ED following a fall at home (11/11/23-11/13/23). During her workup, CT A/P revealed hypoenhancing pancreatic mass concerning for malignancy with indeterminate hypodense lesions in the liver. Oncology workup with Dr. Mosetta Putt. IR consulted for liver biopsy.   -NPO since midnight -INR pending  Plan for image guided liver biopsy on 11/15/23 with Dr. Mosie Epstein  Risks and benefits of liver biopsy was discussed with the patient and/or patient's family including, but not limited to bleeding, infection, damage to adjacent  structures or low yield requiring additional tests.  All of the questions were answered and there is agreement to proceed.  Consent signed and in IR.   Thank you for this interesting consult. I greatly enjoyed meeting Cindy Arias and look forward to participating in their care. A copy of this report was sent to the requesting provider on this date.  Electronically Signed: Jama Flavors, PA-C 11/15/2023, 8:58 AM   I spent  a total of  30 Minutes   in face to face clinical consultation, greater than 50% of which was counseling/coordinating care for liver biopsy.

## 2023-11-15 NOTE — Procedures (Signed)
Interventional Radiology Procedure Note  Procedure: US guided biopsy of liver mass, right liver Complications: None EBL: None Recommendations: - Bedrest 2 hours.   - Routine wound care - Follow up pathology - Advance diet   Signed,  Kyiah Canepa, DO   

## 2023-11-15 NOTE — Progress Notes (Signed)
Patient and friend was given discharge instructions. Both verbalized understanding. 

## 2023-11-19 ENCOUNTER — Inpatient Hospital Stay: Payer: Medicare Other | Admitting: Hematology

## 2023-11-19 ENCOUNTER — Inpatient Hospital Stay: Payer: Medicare Other | Attending: Hematology | Admitting: Hematology

## 2023-11-19 VITALS — BP 142/72 | HR 78 | Temp 97.9°F | Resp 15 | Wt 112.0 lb

## 2023-11-19 DIAGNOSIS — S2242XA Multiple fractures of ribs, left side, initial encounter for closed fracture: Secondary | ICD-10-CM | POA: Diagnosis present

## 2023-11-19 DIAGNOSIS — K769 Liver disease, unspecified: Secondary | ICD-10-CM | POA: Diagnosis present

## 2023-11-19 DIAGNOSIS — Z79899 Other long term (current) drug therapy: Secondary | ICD-10-CM | POA: Diagnosis not present

## 2023-11-19 DIAGNOSIS — M8580 Other specified disorders of bone density and structure, unspecified site: Secondary | ICD-10-CM | POA: Diagnosis not present

## 2023-11-19 DIAGNOSIS — Z5971 Insufficient health insurance coverage: Secondary | ICD-10-CM | POA: Insufficient documentation

## 2023-11-19 DIAGNOSIS — Z882 Allergy status to sulfonamides status: Secondary | ICD-10-CM | POA: Diagnosis not present

## 2023-11-19 DIAGNOSIS — K8689 Other specified diseases of pancreas: Secondary | ICD-10-CM | POA: Diagnosis not present

## 2023-11-19 DIAGNOSIS — Z1721 Progesterone receptor positive status: Secondary | ICD-10-CM | POA: Diagnosis not present

## 2023-11-19 DIAGNOSIS — C50111 Malignant neoplasm of central portion of right female breast: Secondary | ICD-10-CM | POA: Diagnosis not present

## 2023-11-19 DIAGNOSIS — Z79811 Long term (current) use of aromatase inhibitors: Secondary | ICD-10-CM | POA: Insufficient documentation

## 2023-11-19 DIAGNOSIS — Z880 Allergy status to penicillin: Secondary | ICD-10-CM | POA: Diagnosis not present

## 2023-11-19 DIAGNOSIS — Z170 Estrogen receptor positive status [ER+]: Secondary | ICD-10-CM | POA: Insufficient documentation

## 2023-11-19 LAB — SURGICAL PATHOLOGY

## 2023-11-19 NOTE — Progress Notes (Signed)
[facility]   Telephone:([phone]) [phone] Fax:(336) [phone]   Clinic Follow up Note   Patient Care Team: Gweneth Dimitri, MD as PCP - General (Family Medicine) Magrinat, Valentino Hue, MD (Inactive) as Consulting Physician (Oncology) Glenna Fellows, MD (Inactive) as Consulting Physician (General Surgery) Lonie Peak, MD as Attending Physician (Radiation Oncology) Maris Berger, MD as Consulting Physician (Ophthalmology) Lucie Leather Alvira Philips, MD as Consulting Physician (Allergy and Immunology) Willis Modena, MD as Consulting Physician (Gastroenterology) Axel Filler, Larna Daughters, NP as Nurse Practitioner (Hematology and Oncology) Valeria Batman, MD (Inactive) as Consulting Physician (Orthopedic Surgery)  Date of Service:  11/19/2023  CHIEF COMPLAINT: f/u of biopsy results  CURRENT THERAPY:  Pending   Assessment and Plan    Pancreatic Mass with Liver Lesions Incidentally discovered pancreatic mass and liver lesions during rib fracture evaluation. Liver biopsy negative for malignancy, but suspicion remains. Pancreatic mass challenging to biopsy due to location. Differential diagnosis includes pancreatic adenocarcinoma and neuroendocrine tumor, each requiring distinct treatments. Discussed risks and benefits of endoscopic ultrasound-guided biopsy and PET scan. If adenocarcinoma, chemotherapy likely; if neuroendocrine tumor, different PET scan and treatment needed. Explained non-curative nature of surgery if liver lesions are metastatic. - Order PET scan for liver lesions - Schedule endoscopic ultrasound-guided biopsy of pancreatic mass with Dr. Willis Modena - Review case in multidisciplinary conference with radiologist, surgeon, and radiation oncologist - Follow up after biopsy and PET scan results, either in person or via phone  Rib Fractures Five left-sided rib fractures managed with ice packs and gel packs. Using walker for mobility. Physical therapy not initiated  due to insurance issues. Received excellent physical and occupational therapy in hospital. Coping well at home. - Continue using ice packs and gel packs for pain management - Consider outpatient physical therapy if needed  General Health Maintenance Blood counts, tumor markers, kidney, and liver function tests were normal in the hospital. No repeat labs needed. - No additional health maintenance actions required at this time  Plan -I reviewed her liver biopsy which was negative for malignancy. -Will review her case in our GI conference tomorrow -I will order PET scan to evaluate her liver lesion -Will likely proceed with EUS biopsy of her pancreatic lesion by Dr. 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     11/21/2016 Surgery   Right breast lumpectomy ([facility]): IDC, grade 2, 1.9cm, margins negative, 2 SLN negative. 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  History of Present Illness   A 78 year old patient presents for follow-up after a recent hospital stay for fractured ribs. During the hospital stay, a pancreatic mass and liver lesions were incidentally discovered. The patient is currently managing pain from the  fractures with pain  medication as needed and the use of ice packs and gel packs. The patient reports taking the pain medication approximately once every few days, with the last dose taken several days prior to the current visit. The patient is using a walker for mobility. Physical therapy was recommended but has not been started due to insurance issues. The patient denies any stomach discomfort or issues with bowel movements. The patient had a biopsy of the liver, which showed no cancer cells.         All other systems were reviewed with the patient and are negative.  MEDICAL HISTORY:  Past Medical History:  Diagnosis Date   Arthritis    "bone on bone'  left knee   Asthma    LAST FLARE UP 3 YRS AGO   Breast cancer (HCC)    RIGHT BREAST   Colitis    Dysphagia    Dyspnea    NONE IN 3 YRS   Esophageal stricture    GERD (gastroesophageal reflux disease)    History of infertility    History of radiation therapy 12/31/16- 01/21/17   Right Breast 42.56 Gy in 16 fractions   Osteopenia    Pneumonia    Rosacea    Ulcerative colitis (HCC)     SURGICAL HISTORY: Past Surgical History:  Procedure Laterality Date   BREAST LUMPECTOMY WITH RADIOACTIVE SEED AND SENTINEL LYMPH NODE BIOPSY Right 11/21/2016   Procedure: BREAST LUMPECTOMY WITH RADIOACTIVE SEED AND SENTINEL LYMPH NODE BIOPSY;  Surgeon: Glenna Fellows, MD;  Location: [facility];  Service: General;  Laterality: Right;   BREAST SURGERY     RIGHT BREAST LUMPECTOMY   BUNIONECTOMY WITH HAMMERTOE RECONSTRUCTION Left 07/2014   CATARACT EXTRACTION Right 10/2015   ESOPHAGEAL DILATION     EYE SURGERY     OD   JOINT REPLACEMENT     Repair of Chiari Malformation  02/05/13   SUBOCCIPITAL CRANIECTOMY CERVICAL LAMINECTOMY  02/13/2012   Procedure: SUBOCCIPITAL CRANIECTOMY CERVICAL LAMINECTOMY/DURAPLASTY;  Surgeon: Cristi Loron, MD;  Location: [facility];  Service: Neurosurgery;  Laterality: N/A;  Suboccipital craniectomy  with upper cervical laminectomy and duraplasty   TOTAL KNEE ARTHROPLASTY Left 11/04/2018   TOTAL KNEE ARTHROPLASTY Left 11/04/2018   Procedure: LEFT TOTAL KNEE ARTHROPLASTY;  Surgeon: Valeria Batman, MD;  Location: [facility];  Service: Orthopedics;  Laterality: Left;   TUBAL LIGATION  1987    I have reviewed the social history and family history with the patient and they are unchanged from previous note.  ALLERGIES:  is allergic to penicillins, sulfa antibiotics, and albuterol.  MEDICATIONS:  Current Outpatient Medications  Medication Sig Dispense Refill   acetaminophen (TYLENOL) 500 MG tablet Take 2 tablets (1,000 mg total) by mouth every 8 (eight) hours as needed.     budesonide-formoterol (SYMBICORT) 160-4.5 MCG/ACT inhaler Inhale 2 puffs into the lungs in the morning and at bedtime. 30.6 g 2   CALCIUM CITRATE-VITAMIN D PO Take 1 tablet by mouth daily.      fluticasone (FLONASE) 50 MCG/ACT nasal spray Place 2 sprays into both nostrils daily as needed for allergies or rhinitis.     levalbuterol (XOPENEX HFA) 45 MCG/ACT inhaler Inhale 2 puffs into the lungs See admin instructions. Every 4-6 hours prn for sob, cough     lidocaine (LIDODERM) 5 % Place 1 patch onto the skin daily. Remove & Discard patch within 12 hours or as directed by MD 30 patch 0   mesalamine (LIALDA) 1.2 G EC tablet Take  1.2 g by mouth daily with breakfast.      methocarbamol (ROBAXIN) 750 MG tablet Take 1 tablet (750 mg total) by mouth every 8 (eight) hours as needed for muscle spasms. 60 tablet 0   Multiple Vitamins-Iron (MULTIVITAMINS WITH IRON) TABS Take 1 tablet by mouth daily.     omeprazole (PRILOSEC) 40 MG capsule Take 1 capsule (40 mg total) by mouth daily. Take 1 capsule by mouth 1-2 times a day 90 capsule 3   oxyCODONE (OXY IR/ROXICODONE) 5 MG immediate release tablet Take 1-1.5 tablets (5-7.5 mg total) by mouth every 6 (six) hours as needed for breakthrough pain. 15 tablet 0   vitamin E 400 UNIT capsule Take 400  Units by mouth daily.     No current facility-administered medications for this visit.    PHYSICAL EXAMINATION: ECOG PERFORMANCE STATUS: 2 - Symptomatic, <50% confined to bed  Vitals:   11/19/23 1138  BP: (!) 142/72  Pulse: 78  Resp: 15  Temp: 97.9 F (36.6 C)  SpO2: 100%   Wt Readings from Last 3 Encounters:  11/19/23 112 lb (50.8 kg)  11/15/23 112 lb (50.8 kg)  10/02/23 112 lb (50.8 kg)     GENERAL:alert, no distress and comfortable SKIN: skin color, texture, turgor are normal, no rashes or significant lesions EYES: normal, Conjunctiva are pink and non-injected, sclera clear NECK: supple, thyroid normal size, non-tender, without nodularity LYMPH:  no palpable lymphadenopathy in the cervical, axillary  LUNGS: clear to auscultation and percussion with normal breathing effort HEART: regular rate & rhythm and no murmurs and no lower extremity edema ABDOMEN:abdomen soft, non-tender and normal bowel sounds Musculoskeletal:no cyanosis of digits and no clubbing  NEURO: alert & oriented x 3 with fluent speech, no focal motor/sensory deficits    LABORATORY DATA:  I have reviewed the data as listed    Latest Ref Rng & Units 11/15/2023    9:07 AM 11/12/2023    6:05 AM 11/11/2023    9:36 AM  CBC  WBC 4.0 - 10.5 K/uL 7.4  7.8  11.1   Hemoglobin 12.0 - 15.0 g/dL 36.6  44.0  34.7   Hematocrit 36.0 - 46.0 % 41.1  37.0  40.2   Platelets 150 - 400 K/uL 251  218  240         Latest Ref Rng & Units 11/12/2023    6:05 AM 11/11/2023    9:36 AM 08/29/2021   10:07 AM  CMP  Glucose 70 - 99 mg/dL 425  956  95   BUN 8 - 23 mg/dL 12  15  20    Creatinine 0.44 - 1.00 mg/dL 3.87  5.64  3.32   Sodium 135 - 145 mmol/L 139  139  137   Potassium 3.5 - 5.1 mmol/L 4.3  4.1  4.6   Chloride 98 - 111 mmol/L 106  105  105   CO2 22 - 32 mmol/L 26  26  25    Calcium 8.9 - 10.3 mg/dL 8.6  9.2  9.2   Total Protein 6.5 - 8.1 g/dL   7.0   Total Bilirubin 0.3 - 1.2 mg/dL   0.5   Alkaline Phos 38 - 126  U/L   67   AST 15 - 41 U/L   24   ALT 0 - 44 U/L   21       RADIOGRAPHIC STUDIES: I have personally reviewed the radiological images as listed and agreed with the findings in the report. No results found.  Orders Placed This Encounter  Procedures   NM PET Image Initial (PI) Skull Base To Thigh (F-18 FDG)    Standing Status:   Future    Expected Date:   11/26/2023    Expiration Date:   11/18/2024    If indicated for the ordered procedure, I authorize the administration of a radiopharmaceutical per Radiology protocol:   Yes    Preferred imaging location?:   [facility]   All questions were answered. The patient knows to call the clinic with any problems, questions or concerns. No barriers to learning was detected. The total time spent in the appointment was 30 minutes.     Malachy Mood, MD 11/19/2023

## 2023-11-20 ENCOUNTER — Other Ambulatory Visit: Payer: Self-pay | Admitting: *Deleted

## 2023-11-20 NOTE — Progress Notes (Signed)
The proposed treatment discussed in conference is for discussion purpose only and is not a binding recommendation.  The patients have not been physically examined, or presented with their treatment options.  Therefore, final treatment plans cannot be decided.  

## 2023-11-21 ENCOUNTER — Other Ambulatory Visit: Payer: Self-pay | Admitting: Gastroenterology

## 2023-11-22 ENCOUNTER — Encounter (HOSPITAL_COMMUNITY): Payer: Self-pay | Admitting: Gastroenterology

## 2023-11-22 ENCOUNTER — Telehealth: Payer: Self-pay | Admitting: Hematology

## 2023-11-22 NOTE — Progress Notes (Signed)
Attempted to obtain medical history for pre op call via telephone, unable to reach at this time. HIPAA compliant voicemail message left requesting return call to pre surgical testing department.

## 2023-11-22 NOTE — Telephone Encounter (Signed)
Patient is aware of scheduled appointment times/dates per Dr. Latanya Maudlin request

## 2023-11-25 ENCOUNTER — Encounter (HOSPITAL_COMMUNITY): Payer: Self-pay | Admitting: Gastroenterology

## 2023-11-25 ENCOUNTER — Other Ambulatory Visit: Payer: Self-pay

## 2023-11-25 ENCOUNTER — Ambulatory Visit (HOSPITAL_COMMUNITY)
Admission: RE | Admit: 2023-11-25 | Discharge: 2023-11-25 | Disposition: A | Discharge status: Home / Self Care | Payer: Medicare Other | Attending: Gastroenterology | Admitting: Gastroenterology

## 2023-11-25 ENCOUNTER — Encounter (HOSPITAL_COMMUNITY)
Admission: RE | Disposition: A | Discharge status: Home / Self Care | Payer: Self-pay | Source: Home / Self Care | Attending: Gastroenterology

## 2023-11-25 ENCOUNTER — Ambulatory Visit (HOSPITAL_COMMUNITY): Payer: Medicare Other | Admitting: Anesthesiology

## 2023-11-25 DIAGNOSIS — W109XXA Fall (on) (from) unspecified stairs and steps, initial encounter: Secondary | ICD-10-CM | POA: Diagnosis not present

## 2023-11-25 DIAGNOSIS — K219 Gastro-esophageal reflux disease without esophagitis: Secondary | ICD-10-CM | POA: Diagnosis not present

## 2023-11-25 DIAGNOSIS — Z923 Personal history of irradiation: Secondary | ICD-10-CM | POA: Diagnosis not present

## 2023-11-25 DIAGNOSIS — C252 Malignant neoplasm of tail of pancreas: Secondary | ICD-10-CM

## 2023-11-25 DIAGNOSIS — Y92009 Unspecified place in unspecified non-institutional (private) residence as the place of occurrence of the external cause: Secondary | ICD-10-CM | POA: Insufficient documentation

## 2023-11-25 DIAGNOSIS — G935 Compression of brain: Secondary | ICD-10-CM | POA: Diagnosis not present

## 2023-11-25 DIAGNOSIS — J45909 Unspecified asthma, uncomplicated: Secondary | ICD-10-CM

## 2023-11-25 DIAGNOSIS — K869 Disease of pancreas, unspecified: Secondary | ICD-10-CM | POA: Diagnosis present

## 2023-11-25 DIAGNOSIS — S2242XA Multiple fractures of ribs, left side, initial encounter for closed fracture: Secondary | ICD-10-CM | POA: Insufficient documentation

## 2023-11-25 DIAGNOSIS — M1990 Unspecified osteoarthritis, unspecified site: Secondary | ICD-10-CM | POA: Diagnosis not present

## 2023-11-25 DIAGNOSIS — Z853 Personal history of malignant neoplasm of breast: Secondary | ICD-10-CM | POA: Diagnosis not present

## 2023-11-25 DIAGNOSIS — Z87891 Personal history of nicotine dependence: Secondary | ICD-10-CM | POA: Diagnosis not present

## 2023-11-25 HISTORY — PX: UPPER ESOPHAGEAL ENDOSCOPIC ULTRASOUND (EUS): SHX6562

## 2023-11-25 HISTORY — PX: ESOPHAGOGASTRODUODENOSCOPY: SHX5428

## 2023-11-25 HISTORY — PX: FINE NEEDLE ASPIRATION: SHX5430

## 2023-11-25 SURGERY — UPPER ESOPHAGEAL ENDOSCOPIC ULTRASOUND (EUS)
Anesthesia: Monitor Anesthesia Care

## 2023-11-25 MED ORDER — LIDOCAINE HCL (CARDIAC) PF 100 MG/5ML IV SOSY
PREFILLED_SYRINGE | INTRAVENOUS | Status: DC | PRN
  Administered 2023-11-25: 100 mg via INTRATRACHEAL

## 2023-11-25 MED ORDER — PROPOFOL 1000 MG/100ML IV EMUL
INTRAVENOUS | Status: AC
  Filled 2023-11-25: qty 100

## 2023-11-25 MED ORDER — PROPOFOL 500 MG/50ML IV EMUL
INTRAVENOUS | Status: DC | PRN
  Administered 2023-11-25: 70 ug/kg/min via INTRAVENOUS

## 2023-11-25 MED ORDER — FENTANYL CITRATE (PF) 100 MCG/2ML IJ SOLN
INTRAMUSCULAR | Status: AC
  Filled 2023-11-25: qty 2

## 2023-11-25 MED ORDER — FENTANYL CITRATE (PF) 100 MCG/2ML IJ SOLN
INTRAMUSCULAR | Status: DC | PRN
  Administered 2023-11-25 (×2): 25 ug via INTRAVENOUS

## 2023-11-25 MED ORDER — SODIUM CHLORIDE 0.9 % IV SOLN: INTRAVENOUS | Status: DC | PRN

## 2023-11-25 MED ORDER — PROPOFOL 10 MG/ML IV BOLUS
INTRAVENOUS | Status: DC | PRN
  Administered 2023-11-25 (×2): 20 mg via INTRAVENOUS
  Administered 2023-11-25: 10 mg via INTRAVENOUS

## 2023-11-25 NOTE — Op Note (Signed)
[facility] Patient Name: Cindy Arias Procedure Date: 11/25/2023 MRN: [id number] Attending MD: Willis Modena , MD, [id number] Date of Birth: Dec 27, 1945 CSN: [id number] Age: 78 Admit Type: Inpatient Procedure:                Upper EUS Indications:              Suspected mass in pancreas on CT scan Providers:                Willis Modena, MD, Doristine Mango, RN, Norman Clay,                            RN, Alan Ripper, Technician Referring MD:             Dr. Mosetta Putt (oncology) Medicines:                Monitored Anesthesia Care Complications:            No immediate complications. Estimated Blood Loss:     Estimated blood loss: none. Procedure:                Pre-Anesthesia Assessment:                           - Prior to the procedure, a History and Physical                            was performed, and patient medications and                            allergies were reviewed. The patient's tolerance of                            previous anesthesia was also reviewed. The risks                            and benefits of the procedure and the sedation                            options and risks were discussed with the patient.                            All questions were answered, and informed consent                            was obtained. Prior Anticoagulants: The patient has                            taken no anticoagulant or antiplatelet agents. ASA                            Grade Assessment: III - A patient with severe                            systemic disease. After reviewing the risks and  benefits, the patient was deemed in satisfactory                            condition to undergo the procedure.                           After obtaining informed consent, the endoscope was                            passed under direct vision. Throughout the                            procedure, the patient's blood pressure, pulse, and                             oxygen saturations were monitored continuously. The                            [id number] ([id number]) Olympus linear ultrasound scope                            was introduced through the mouth, and advanced to                            the second part of duodenum. The upper EUS was                            accomplished without difficulty. The patient                            tolerated the procedure well. Scope In: Scope Out: Findings:      ENDOSONOGRAPHIC FINDING: :      There was no sign of significant endosonographic abnormality in the       ampulla.      There was no sign of significant endosonographic abnormality in the       common bile duct and in the gallbladder. The maximum diameter of the       ducts were 5 mm.      There was no sign of significant endosonographic abnormality in the left       lobe of the liver. Homogeneous parenchyma was identified.      Two malignant-appearing lymph nodes were visualized in the       peripancreatic region. The nodes were round, hypoechoic and had well       defined margins.      A round mass was identified in the pancreatic body. The mass was       hypoechoic. The mass measured 25 mm by 26 mm in maximal cross-sectional       diameter. The outer margins were irregular. An intact interface was seen       between the mass and the superior mesenteric artery, celiac trunk,       portal vein, superior mesenteric vein and splenic vein suggesting a lack       of invasion. The remainder of the pancreas was examined. The       endosonographic appearance of parenchyma and the upstream pancreatic  duct indicated no duct dilation. Fine needle aspiration for cytology was       performed. Color Doppler imaging was utilized prior to needle puncture       to confirm a lack of significant vascular structures within the needle       path. Four passes were made with the 25 gauge needle using a       transgastric approach. A stylet  was used. A cytotechnologist was present       to evaluate the adequacy of the specimen. Final cytology results are       pending. Impression:               - There was no sign of significant pathology in the                            ampulla.                           - There was no sign of significant pathology in the                            common bile duct and in the gallbladder.                           - There was no evidence of significant pathology in                            the left lobe of the liver.                           - Two malignant-appearing lymph nodes were                            visualized in the peripancreatic region.                           - A mass was identified in the pancreatic body.                            This was staged T2 N1 Mx by endosonographic                            criteria. Fine needle aspiration performed. Moderate Sedation:      None Recommendation:           - Discharge patient to home (via wheelchair).                           - Resume previous diet today.                           - Continue present medications.                           - Await cytology results.                           -  Return to GI clinic PRN.                           - Return to referring physician as previously                            scheduled. Procedure Code(s):        --- Professional ---                           [id number], Esophagogastroduodenoscopy, flexible,                            transoral; with transendoscopic ultrasound-guided                            intramural or transmural fine needle                            aspiration/biopsy(s), (includes endoscopic                            ultrasound examination limited to the esophagus,                            stomach or duodenum, and adjacent structures) Diagnosis Code(s):        --- Professional ---                           I89.9, Noninfective disorder of lymphatic vessels                             and lymph nodes, unspecified                           K86.89, Other specified diseases of pancreas                           R93.3, Abnormal findings on diagnostic imaging of                            other parts of digestive tract CPT copyright 2022 [employer]. All rights reserved. The codes documented in this report are preliminary and upon coder review may  be revised to meet current compliance requirements. Willis Modena, MD 11/25/2023 12:13:32 PM This report has been signed electronically. Number of Addenda: 0

## 2023-11-25 NOTE — Discharge Instructions (Signed)
YOU HAD AN ENDOSCOPIC PROCEDURE TODAY: Refer to the procedure report and other information in the discharge instructions given to you for any specific questions about what was found during the examination. If this information does not answer your questions, please call [facility] office at [phone] to clarify.   YOU SHOULD EXPECT: Some feelings of bloating in the abdomen. Passage of more gas than usual. Walking can help get rid of the air that was put into your GI tract during the procedure and reduce the bloating. If you had a lower endoscopy (such as a colonoscopy or flexible sigmoidoscopy) you may notice spotting of blood in your stool or on the toilet paper. Some abdominal soreness may be present for a day or two, also.  DIET: Your first meal following the procedure should be a light meal and then it is ok to progress to your normal diet. A half-sandwich or bowl of soup is an example of a good first meal. Heavy or fried foods are harder to digest and may make you feel nauseous or bloated. Drink plenty of fluids but you should avoid alcoholic beverages for 24 hours. If you had a esophageal dilation, please see attached instructions for diet.    ACTIVITY: Your care partner should take you home directly after the procedure. You should plan to take it easy, moving slowly for the rest of the day. You can resume normal activity the day after the procedure however YOU SHOULD NOT DRIVE, use power tools, machinery or perform tasks that involve climbing or major physical exertion for 24 hours (because of the sedation medicines used during the test).   SYMPTOMS TO REPORT IMMEDIATELY: A gastroenterologist can be reached at any hour. Please call [phone]  for any of the following symptoms:  Following lower endoscopy (colonoscopy, flexible sigmoidoscopy) Excessive amounts of blood in the stool  Significant tenderness, worsening of abdominal pains  Swelling of the abdomen that is new, acute  Fever of 100  or higher  Following upper endoscopy (EGD, EUS, ERCP, esophageal dilation) Vomiting of blood or coffee ground material  New, significant abdominal pain  New, significant chest pain or pain under the shoulder blades  Painful or persistently difficult swallowing  New shortness of breath  Black, tarry-looking or red, bloody stools  FOLLOW UP:  If any biopsies were taken you will be contacted by phone or by letter within the next 1-3 weeks. Call [phone]  if you have not heard about the biopsies in 3 weeks.  Please also call with any specific questions about appointments or follow up tests. YOU HAD AN ENDOSCOPIC PROCEDURE TODAY: Refer to the procedure report and other information in the discharge instructions given to you for any specific questions about what was found during the examination. If this information does not answer your questions, please call [facility] office at [phone] to clarify.   YOU SHOULD EXPECT: Some feelings of bloating in the abdomen. Passage of more gas than usual. Walking can help get rid of the air that was put into your GI tract during the procedure and reduce the bloating. If you had a lower endoscopy (such as a colonoscopy or flexible sigmoidoscopy) you may notice spotting of blood in your stool or on the toilet paper. Some abdominal soreness may be present for a day or two, also.  DIET: Your first meal following the procedure should be a light meal and then it is ok to progress to your normal diet. A half-sandwich or bowl of soup is an  example of a good first meal. Heavy or fried foods are harder to digest and may make you feel nauseous or bloated. Drink plenty of fluids but you should avoid alcoholic beverages for 24 hours. If you had a esophageal dilation, please see attached instructions for diet.    ACTIVITY: Your care partner should take you home directly after the procedure. You should plan to take it easy, moving slowly for the rest of the day. You can resume  normal activity the day after the procedure however YOU SHOULD NOT DRIVE, use power tools, machinery or perform tasks that involve climbing or major physical exertion for 24 hours (because of the sedation medicines used during the test).   SYMPTOMS TO REPORT IMMEDIATELY: A gastroenterologist can be reached at any hour. Please call [phone]  for any of the following symptoms:  Following lower endoscopy (colonoscopy, flexible sigmoidoscopy) Excessive amounts of blood in the stool  Significant tenderness, worsening of abdominal pains  Swelling of the abdomen that is new, acute  Fever of 100 or higher  Following upper endoscopy (EGD, EUS, ERCP, esophageal dilation) Vomiting of blood or coffee ground material  New, significant abdominal pain  New, significant chest pain or pain under the shoulder blades  Painful or persistently difficult swallowing  New shortness of breath  Black, tarry-looking or red, bloody stools  FOLLOW UP:  If any biopsies were taken you will be contacted by phone or by letter within the next 1-3 weeks. Call [phone]  if you have not heard about the biopsies in 3 weeks.  Please also call with any specific questions about appointments or follow up tests.

## 2023-11-25 NOTE — Interval H&P Note (Signed)
History and Physical Interval Note:  11/25/2023 11:08 AM  Cindy Arias  has presented today for surgery, with the diagnosis of pancreatic tail mass.  The various methods of treatment have been discussed with the patient and family. After consideration of risks, benefits and other options for treatment, the patient has consented to  Procedure(s): UPPER ESOPHAGEAL ENDOSCOPIC ULTRASOUND (EUS) (N/A) FINE NEEDLE ASPIRATION (FNA) LINEAR (N/A) as a surgical intervention.  The patient's history has been reviewed, patient examined, no change in status, stable for surgery.  I have reviewed the patient's chart and labs.  Questions were answered to the patient's satisfaction.     Freddy Jaksch

## 2023-11-25 NOTE — Transfer of Care (Signed)
Immediate Anesthesia Transfer of Care Note  Patient: Cindy Arias  Procedure(s) Performed: UPPER ESOPHAGEAL ENDOSCOPIC ULTRASOUND (EUS) FINE NEEDLE ASPIRATION (FNA) LINEAR ESOPHAGOGASTRODUODENOSCOPY (EGD)  Patient Location: PACU  Anesthesia Type:MAC  Level of Consciousness: awake and alert   Airway & Oxygen Therapy: Patient Spontanous Breathing and Patient connected to face mask oxygen  Post-op Assessment: Report given to RN and Post -op Vital signs reviewed and stable  Post vital signs: Reviewed and stable  Last Vitals:  Vitals Value Taken Time  BP 168/83 11/25/23 1204  Temp 36.6 C 11/25/23 1204  Pulse 74 11/25/23 1207  Resp 18 11/25/23 1207  SpO2 99 % 11/25/23 1207  Vitals shown include unfiled device data.  Last Pain:  Vitals:   11/25/23 1204  TempSrc: Temporal  PainSc: 0-No pain         Complications: No notable events documented.

## 2023-11-25 NOTE — Anesthesia Preprocedure Evaluation (Signed)
Anesthesia Evaluation  Patient identified by MRN, date of birth, ID band Patient awake    Reviewed: Allergy & Precautions, NPO status , Patient's Chart, lab work & pertinent test results  Airway Mallampati: II  TM Distance: >3 FB Neck ROM: Full    Dental   Pulmonary shortness of breath, asthma    breath sounds clear to auscultation       Cardiovascular negative cardio ROS  Rhythm:Regular Rate:Normal     Neuro/Psych negative neurological ROS     GI/Hepatic Neg liver ROS, PUD,GERD  ,,Pancreatic tail mass   Endo/Other  negative endocrine ROS    Renal/GU negative Renal ROS     Musculoskeletal  (+) Arthritis ,    Abdominal   Peds  Hematology negative hematology ROS (+)   Anesthesia Other Findings   Reproductive/Obstetrics                             Anesthesia Physical Anesthesia Plan  ASA: 3  Anesthesia Plan: MAC   Post-op Pain Management:    Induction:   PONV Risk Score and Plan: 2 and Propofol infusion  Airway Management Planned: Natural Airway, Simple Face Mask and Nasal Cannula  Additional Equipment:   Intra-op Plan:   Post-operative Plan:   Informed Consent: I have reviewed the patients History and Physical, chart, labs and discussed the procedure including the risks, benefits and alternatives for the proposed anesthesia with the patient or authorized representative who has indicated his/her understanding and acceptance.       Plan Discussed with:   Anesthesia Plan Comments:        Anesthesia Quick Evaluation

## 2023-11-26 LAB — CYTOLOGY - NON PAP

## 2023-11-26 NOTE — Anesthesia Postprocedure Evaluation (Signed)
Anesthesia Post Note  Patient: Cindy Arias  Procedure(s) Performed: UPPER ESOPHAGEAL ENDOSCOPIC ULTRASOUND (EUS) FINE NEEDLE ASPIRATION (FNA) LINEAR ESOPHAGOGASTRODUODENOSCOPY (EGD)     Patient location during evaluation: PACU Anesthesia Type: MAC Level of consciousness: awake and alert Pain management: pain level controlled Vital Signs Assessment: post-procedure vital signs reviewed and stable Respiratory status: spontaneous breathing, nonlabored ventilation, respiratory function stable and patient connected to nasal cannula oxygen Cardiovascular status: stable and blood pressure returned to baseline Postop Assessment: no apparent nausea or vomiting Anesthetic complications: no   No notable events documented.  Last Vitals:  Vitals:   11/25/23 1215 11/25/23 1220  BP:  (!) 189/69  Pulse: 77 77  Resp: 20 14  Temp:    SpO2: 98% 100%    Last Pain:  Vitals:   11/25/23 1220  TempSrc:   PainSc: 0-No pain   Pain Goal:                   Kennieth Rad

## 2023-11-27 ENCOUNTER — Encounter (HOSPITAL_COMMUNITY): Payer: Self-pay | Admitting: Gastroenterology

## 2023-11-27 ENCOUNTER — Encounter (HOSPITAL_COMMUNITY)
Admission: RE | Admit: 2023-11-27 | Discharge: 2023-11-27 | Disposition: A | Discharge status: Home / Self Care | Payer: Medicare Other | Source: Ambulatory Visit | Attending: Hematology | Admitting: Hematology

## 2023-11-27 DIAGNOSIS — M4186 Other forms of scoliosis, lumbar region: Secondary | ICD-10-CM | POA: Insufficient documentation

## 2023-11-27 DIAGNOSIS — R160 Hepatomegaly, not elsewhere classified: Secondary | ICD-10-CM | POA: Diagnosis not present

## 2023-11-27 DIAGNOSIS — S2242XA Multiple fractures of ribs, left side, initial encounter for closed fracture: Secondary | ICD-10-CM | POA: Diagnosis not present

## 2023-11-27 DIAGNOSIS — X58XXXA Exposure to other specified factors, initial encounter: Secondary | ICD-10-CM | POA: Insufficient documentation

## 2023-11-27 DIAGNOSIS — I700 Atherosclerosis of aorta: Secondary | ICD-10-CM | POA: Diagnosis not present

## 2023-11-27 DIAGNOSIS — K8689 Other specified diseases of pancreas: Secondary | ICD-10-CM | POA: Diagnosis present

## 2023-11-27 LAB — GLUCOSE, CAPILLARY: Glucose-Capillary: 96 mg/dL (ref 70–99)

## 2023-11-27 MED ORDER — FLUDEOXYGLUCOSE F - 18 (FDG) INJECTION
5.4800 | Freq: Once | INTRAVENOUS | Status: AC
  Administered 2023-11-27: 5.48 via INTRAVENOUS

## 2023-12-03 DIAGNOSIS — C259 Malignant neoplasm of pancreas, unspecified: Secondary | ICD-10-CM | POA: Insufficient documentation

## 2023-12-03 NOTE — Assessment & Plan Note (Signed)
 [id number] with liver metastasis  -diagnosed in 10/2023. Incidentally discovered pancreatic mass and liver lesions during rib fracture evaluation. Liver biopsy negative for malignancy, but suspicion remains.  -confirmed by EUS pancreatic mass biopsy by Dr. Outlaw  -PET scan reviewed liver mets  -reviewed incurable nature of her cancer and treatment options

## 2023-12-04 ENCOUNTER — Other Ambulatory Visit: Payer: Medicare Other

## 2023-12-04 ENCOUNTER — Other Ambulatory Visit: Payer: Self-pay | Admitting: Genetic Counselor

## 2023-12-04 ENCOUNTER — Encounter: Payer: Self-pay | Admitting: Hematology

## 2023-12-04 ENCOUNTER — Other Ambulatory Visit: Payer: Self-pay

## 2023-12-04 ENCOUNTER — Inpatient Hospital Stay: Payer: Medicare Other | Attending: Hematology | Admitting: Hematology

## 2023-12-04 ENCOUNTER — Telehealth: Payer: Self-pay | Admitting: Genetic Counselor

## 2023-12-04 VITALS — BP 122/65 | HR 81 | Temp 98.2°F | Resp 16 | Wt 112.5 lb

## 2023-12-04 DIAGNOSIS — C787 Secondary malignant neoplasm of liver and intrahepatic bile duct: Secondary | ICD-10-CM | POA: Insufficient documentation

## 2023-12-04 DIAGNOSIS — Z9181 History of falling: Secondary | ICD-10-CM | POA: Diagnosis not present

## 2023-12-04 DIAGNOSIS — Z79811 Long term (current) use of aromatase inhibitors: Secondary | ICD-10-CM | POA: Insufficient documentation

## 2023-12-04 DIAGNOSIS — Z882 Allergy status to sulfonamides status: Secondary | ICD-10-CM | POA: Insufficient documentation

## 2023-12-04 DIAGNOSIS — C50111 Malignant neoplasm of central portion of right female breast: Secondary | ICD-10-CM

## 2023-12-04 DIAGNOSIS — Z880 Allergy status to penicillin: Secondary | ICD-10-CM | POA: Diagnosis not present

## 2023-12-04 DIAGNOSIS — Z800 Family history of malignant neoplasm of digestive organs: Secondary | ICD-10-CM | POA: Diagnosis not present

## 2023-12-04 DIAGNOSIS — Z853 Personal history of malignant neoplasm of breast: Secondary | ICD-10-CM | POA: Insufficient documentation

## 2023-12-04 DIAGNOSIS — Z170 Estrogen receptor positive status [ER+]: Secondary | ICD-10-CM

## 2023-12-04 DIAGNOSIS — C252 Malignant neoplasm of tail of pancreas: Secondary | ICD-10-CM | POA: Diagnosis present

## 2023-12-04 DIAGNOSIS — S2242XA Multiple fractures of ribs, left side, initial encounter for closed fracture: Secondary | ICD-10-CM | POA: Insufficient documentation

## 2023-12-04 DIAGNOSIS — Z79899 Other long term (current) drug therapy: Secondary | ICD-10-CM | POA: Insufficient documentation

## 2023-12-04 DIAGNOSIS — M8580 Other specified disorders of bone density and structure, unspecified site: Secondary | ICD-10-CM | POA: Insufficient documentation

## 2023-12-04 DIAGNOSIS — R0781 Pleurodynia: Secondary | ICD-10-CM | POA: Diagnosis not present

## 2023-12-04 MED ORDER — LIDOCAINE-PRILOCAINE 2.5-2.5 % EX CREA: TOPICAL_CREAM | CUTANEOUS | 3 refills | Status: DC

## 2023-12-04 MED ORDER — ONDANSETRON HCL 8 MG PO TABS: 8.0000 mg | ORAL_TABLET | Freq: Three times a day (TID) | ORAL | 1 refills | Status: AC | PRN

## 2023-12-04 MED ORDER — PROCHLORPERAZINE MALEATE 10 MG PO TABS: 10.0000 mg | ORAL_TABLET | Freq: Four times a day (QID) | ORAL | 1 refills | Status: AC | PRN

## 2023-12-04 NOTE — Progress Notes (Signed)
START ON PATHWAY REGIMEN - Pancreatic Adenocarcinoma ° ° °  A cycle is every 28 days: °    Nab-paclitaxel (protein bound)  °    Gemcitabine  ° °**Always confirm dose/schedule in your pharmacy ordering system** ° °Patient Characteristics: °Metastatic Disease, First Line, PS  ?  2, BRCA1/2 and PALB2 Mutation Absent/Unknown °Therapeutic Status: Metastatic Disease °Line of Therapy: First Line °ECOG Performance Status: 2 °BRCA1/2 Mutation Status: Awaiting Test Results °PALB2 Mutation Status: Awaiting Test Results °Intent of Therapy: °Non-Curative / Palliative Intent, Discussed with Patient °

## 2023-12-04 NOTE — Progress Notes (Signed)
 Email sent to [facility] & Augusto Lederer in [facility] Pathology Depts regarding adding on an MMR to Case# [id number].  Dr. Maryalice Smaller and Cleda Curly, CMA "CC" to email.  Marijo Shove, will place orders for Genetic Screening to be done on 12/11/2023

## 2023-12-04 NOTE — Progress Notes (Signed)
 [facility]   [phone] [phone]   Clinic Follow up Note   Patient Care Team: Aisha Harvey, MD as PCP - General (Family Medicine) Magrinat, Sandria BROCKS, MD (Inactive) as Consulting Physician (Oncology) Mikell Katz, MD (Inactive) as Consulting Physician (General Surgery) Izell Domino, MD as Attending Physician (Radiation Oncology) Leslee Reusing, MD as Consulting Physician (Ophthalmology) Maurilio Camellia PARAS, MD as Consulting Physician (Allergy and Immunology) Burnette Fallow, MD as Consulting Physician (Gastroenterology) Crawford Morna Pickle, NP as Nurse Practitioner (Hematology and Oncology) Anderson Maude ORN, MD (Inactive) as Consulting Physician (Orthopedic Surgery)  Date of Service:  12/04/2023  CHIEF COMPLAINT: f/u of pancreatic cancer  CURRENT THERAPY:  Pending first-line chemotherapy gemcitabine and Abraxane  Oncology History   Pancreatic cancer [facility]) [id number] with liver metastasis  -diagnosed in 10/2023. Incidentally discovered pancreatic mass and liver lesions during rib fracture evaluation. Liver biopsy negative for malignancy, but suspicion remains.  -confirmed by EUS pancreatic mass biopsy by Dr. Outlaw  -PET scan reviewed liver mets  -reviewed incurable nature of her cancer and treatment options    Assessment and Plan    Metastatic Pancreatic Adenocarcinoma Confirmed pancreatic adenocarcinoma with EUS biopsy of pancreatic ,ass. Primary tumor in the pancreatic tail. PET scan showed liver metastasis; liver biopsy was negative. -Surgery is not viable due to disease spread. Chemotherapy recommended. Prognosis generally poor (months to a year), but lack of symptoms may indicate slower progression. Discussed chemotherapy options: FOLFIRINOX (most intensive), gemcitabine and Abraxane (more tolerable), single-agent gemcitabine (least effective). -Due to her age and limited social support (she is a caregiver for her husband), I  recommend first-line chemotherapy with gemcitabine and Abraxane -Chemotherapy consent: Side effects including but does not not limited to, fatigue, nausea, vomiting, diarrhea, hair loss, neuropathy, fluid retention, renal and kidney dysfunction, neutropenic fever, needed for blood transfusion, bleeding, were discussed with patient in great detail. She agrees to proceed. -The goal of chemotherapy is palliative to prolong her life -Emphasized genetic testing for BRCA1, BRCA2, PALP2 mutations for additional treatment options. Discussed immunotherapy possibility if MMR testing indicates Lynch syndrome. - Order port placement for chemotherapy - Schedule chemotherapy class - Administer gemcitabine and Abraxane regimen: two weeks on, one week off - Request MMR testing for Lynch syndrome - Refer to child psychotherapist for support and home care services - Prescribe antiemetics and lidocaine  cream for port site  Breast Cancer (2017) No current active disease. - Monitor for recurrence  General Health Maintenance Discussed genetic testing importance due to family cancer history. Potential for targeted therapies if mutations found. - Perform genetic testing during next lab draw - Refer to genetic counselor if positive  Recent fall and left rib fractures -She is recovering, pain has improved, she uses a walker  Plan -I reviewed her recent biopsy results, and PET scan image and the findings.  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     11/21/2016 Surgery   Right breast lumpectomy ([facility]): IDC, grade 2, 1.9cm, margins negative, 2 SLN negative. 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  History of Present Illness   A 78 year old female with a history of breast cancer presents for follow-up after a recent diagnosis of metastatic pancreatic cancer. The cancer was incidentally discovered during a hospital admission for a rib fracture. The patient reports no symptoms related to the cancer, describing the diagnosis as a shock. The patient's mother had liver cancer, raising concerns about a possible genetic predisposition. The patient is currently healing from the rib fracture and reports coping with the pain using ice packs and a muscle relaxer. 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         All other systems were reviewed with the patient and are negative.  MEDICAL HISTORY:  Past Medical History:  Diagnosis Date   Arthritis    bone on bone'  left knee   Asthma    LAST FLARE UP 3 YRS AGO   Breast cancer (HCC)    RIGHT BREAST   Colitis    Dysphagia    Dyspnea    NONE IN 3 YRS    Esophageal stricture    GERD (gastroesophageal reflux disease)    History of infertility    History of radiation therapy 12/31/16- 01/21/17   Right Breast 42.56 Gy in 16 fractions   Osteopenia    Pneumonia    Rosacea    Ulcerative colitis (HCC)     SURGICAL HISTORY: Past Surgical History:  Procedure Laterality Date   BREAST LUMPECTOMY WITH RADIOACTIVE SEED AND SENTINEL LYMPH NODE BIOPSY Right 11/21/2016   Procedure: BREAST LUMPECTOMY WITH RADIOACTIVE SEED AND SENTINEL LYMPH NODE BIOPSY;  Surgeon: Morene Olives, MD;  Location: [facility];  Service: General;  Laterality: Right;   BREAST SURGERY     RIGHT BREAST LUMPECTOMY   BUNIONECTOMY WITH HAMMERTOE RECONSTRUCTION Left 07/2014   CATARACT EXTRACTION Right 10/2015   ESOPHAGEAL DILATION     ESOPHAGOGASTRODUODENOSCOPY N/A 11/25/2023   Procedure: ESOPHAGOGASTRODUODENOSCOPY (EGD);  Surgeon: Burnette Fallow, MD;  Location: [facility];  Service: Gastroenterology;  Laterality: N/A;   EYE SURGERY     OD   FINE NEEDLE ASPIRATION N/A 11/25/2023   Procedure: FINE NEEDLE ASPIRATION (FNA) LINEAR;  Surgeon: Burnette Fallow, MD;  Location: [facility];  Service: Gastroenterology;  Laterality: N/A;   JOINT REPLACEMENT     Repair of Chiari Malformation  02/05/13   SUBOCCIPITAL CRANIECTOMY CERVICAL LAMINECTOMY  02/13/2012   Procedure: SUBOCCIPITAL CRANIECTOMY CERVICAL LAMINECTOMY/DURAPLASTY;  Surgeon: Reyes JONETTA Budge, MD;  Location: [facility];  Service: Neurosurgery;  Laterality: N/A;  Suboccipital craniectomy with upper cervical laminectomy and duraplasty   TOTAL  KNEE ARTHROPLASTY Left 11/04/2018   TOTAL KNEE ARTHROPLASTY Left 11/04/2018   Procedure: LEFT TOTAL KNEE ARTHROPLASTY;  Surgeon: Anderson Maude ORN, MD;  Location: [facility];  Service: Orthopedics;  Laterality: Left;   TUBAL LIGATION  1987   UPPER ESOPHAGEAL ENDOSCOPIC ULTRASOUND (EUS) N/A 11/25/2023   Procedure: UPPER ESOPHAGEAL ENDOSCOPIC ULTRASOUND (EUS);  Surgeon: Burnette Fallow, MD;  Location: [facility];  Service: Gastroenterology;  Laterality: N/A;    I have reviewed the social history and family history with the patient and they are unchanged from previous note.  ALLERGIES:  is allergic to penicillins, sulfa antibiotics, and albuterol.  MEDICATIONS:  Current Outpatient Medications  Medication Sig Dispense Refill   acetaminophen  (TYLENOL ) 500 MG tablet Take 2 tablets (1,000 mg total) by mouth every 8 (eight) hours as needed.     budesonide -formoterol  (SYMBICORT ) 160-4.5 MCG/ACT inhaler Inhale 2 puffs into the lungs in the morning and at bedtime. 30.6 g 2   CALCIUM CITRATE-VITAMIN D  PO Take 1 tablet by mouth daily.      fluticasone  (FLONASE ) 50 MCG/ACT nasal spray Place 2 sprays into both nostrils daily as needed for allergies or rhinitis.     levalbuterol  (XOPENEX  HFA) 45 MCG/ACT inhaler Inhale 2 puffs into the lungs See admin instructions. Every 4-6 hours prn for sob, cough     lidocaine  (LIDODERM ) 5 % Place 1 patch onto the skin daily. Remove & Discard patch within 12 hours or as directed by MD 30 patch 0   lidocaine -prilocaine  (EMLA ) cream Apply to affected area once 30 g 3   mesalamine  (LIALDA ) 1.2 G EC tablet Take 1.2 g by mouth daily with breakfast.      methocarbamol  (ROBAXIN ) 750 MG tablet Take 1 tablet (750 mg total) by mouth every 8 (eight) hours as needed for muscle spasms. 60 tablet 0   Multiple Vitamins-Iron  (MULTIVITAMINS WITH IRON ) TABS Take 1 tablet by mouth daily.     omeprazole  (PRILOSEC) 40 MG capsule Take 1 capsule (40 mg total) by mouth daily. Take 1 capsule by mouth 1-2 times a day 90 capsule 3   ondansetron  (ZOFRAN ) 8 MG tablet Take 1 tablet (8 mg total) by mouth every 8 (eight) hours as needed for nausea or vomiting. 30 tablet 1   oxyCODONE  (OXY IR/ROXICODONE ) 5 MG immediate release tablet Take 1-1.5 tablets (5-7.5 mg total) by mouth every 6 (six) hours as needed for breakthrough pain. 15 tablet 0   prochlorperazine  (COMPAZINE ) 10  MG tablet Take 1 tablet (10 mg total) by mouth every 6 (six) hours as needed for nausea or vomiting. 30 tablet 1   vitamin E  400 UNIT capsule Take 400 Units by mouth daily.     No current facility-administered medications for this visit.    PHYSICAL EXAMINATION: ECOG PERFORMANCE STATUS: 1 - Symptomatic but completely ambulatory  Vitals:   12/04/23 0954  BP: 122/65  Pulse: 81  Resp: 16  Temp: 98.2 F (36.8 C)  SpO2: 100%   Wt Readings from Last 3 Encounters:  12/04/23 112 lb 8 oz (51 kg)  11/25/23 110 lb (49.9 kg)  11/19/23 112 lb (50.8 kg)     GENERAL:alert, no distress and comfortable SKIN: skin color, texture, turgor are normal, no rashes or significant lesions EYES: normal, Conjunctiva are pink and non-injected, sclera clear NECK: supple, thyroid normal size, non-tender, without nodularity LYMPH:  no palpable lymphadenopathy in the cervical, axillary  LUNGS: clear to auscultation and percussion with normal breathing effort HEART: regular rate & rhythm and no murmurs  and no lower extremity edema ABDOMEN:abdomen soft, non-tender and normal bowel sounds Musculoskeletal:no cyanosis of digits and no clubbing  NEURO: alert & oriented x 3 with fluent speech, no focal motor/sensory deficits   LABORATORY DATA:  I have reviewed the data as listed    Latest Ref Rng & Units 11/15/2023    9:07 AM 11/12/2023    6:05 AM 11/11/2023    9:36 AM  CBC  WBC 4.0 - 10.5 K/uL 7.4  7.8  11.1   Hemoglobin 12.0 - 15.0 g/dL 86.4  87.7  86.6   Hematocrit 36.0 - 46.0 % 41.1  37.0  40.2   Platelets 150 - 400 K/uL 251  218  240         Latest Ref Rng & Units 11/12/2023    6:05 AM 11/11/2023    9:36 AM 08/29/2021   10:07 AM  CMP  Glucose 70 - 99 mg/dL 885  876  95   BUN 8 - 23 mg/dL 12  15  20    Creatinine 0.44 - 1.00 mg/dL 9.21  9.30  9.25   Sodium 135 - 145 mmol/L 139  139  137   Potassium 3.5 - 5.1 mmol/L 4.3  4.1  4.6   Chloride 98 - 111 mmol/L 106  105  105   CO2 22 - 32 mmol/L 26  26   25    Calcium 8.9 - 10.3 mg/dL 8.6  9.2  9.2   Total Protein 6.5 - 8.1 g/dL   7.0   Total Bilirubin 0.3 - 1.2 mg/dL   0.5   Alkaline Phos 38 - 126 U/L   67   AST 15 - 41 U/L   24   ALT 0 - 44 U/L   21       RADIOGRAPHIC STUDIES: I have personally reviewed the radiological images as listed and agreed with the findings in the report. No results found.    Orders Placed This Encounter  Procedures   Consent Attestation for Oncology Treatment    The patient is informed of risks, benefits, side-effects of the prescribed oncology treatment. Potential short term and long term side effects and response rates discussed. After a long discussion, the patient made informed decision to proceed.:   Yes   CBC with Differential (Cancer Center Only)    Standing Status:   Future    Expected Date:   12/11/2023    Expiration Date:   12/10/2024   CMP (Cancer Center only)    Standing Status:   Future    Expected Date:   12/11/2023    Expiration Date:   12/10/2024   CBC with Differential (Cancer Center Only)    Standing Status:   Future    Expected Date:   12/18/2023    Expiration Date:   12/17/2024   CMP (Cancer Center only)    Standing Status:   Future    Expected Date:   12/18/2023    Expiration Date:   12/17/2024   CBC with Differential (Cancer Center Only)    Standing Status:   Future    Expected Date:   01/01/2024    Expiration Date:   12/31/2024   CMP (Cancer Center only)    Standing Status:   Future    Expected Date:   01/01/2024    Expiration Date:   12/31/2024   CBC with Differential (Cancer Center Only)    Standing Status:   Future    Expected Date:   01/08/2024  Expiration Date:   01/07/2025   CMP (Cancer Center only)    Standing Status:   Future    Expected Date:   01/08/2024    Expiration Date:   01/07/2025   CBC with Differential (Cancer Center Only)    Standing Status:   Future    Expected Date:   01/22/2024    Expiration Date:   01/21/2025   CMP (Cancer Center only)    Standing Status:    Future    Expected Date:   01/22/2024    Expiration Date:   01/21/2025   CBC with Differential (Cancer Center Only)    Standing Status:   Future    Expected Date:   01/29/2024    Expiration Date:   01/28/2025   CMP (Cancer Center only)    Standing Status:   Future    Expected Date:   01/29/2024    Expiration Date:   01/28/2025   All questions were answered. The patient knows to call the clinic with any problems, questions or concerns. No barriers to learning was detected. The total time spent in the appointment was 60 minutes.     Onita Mattock, MD 12/04/2023

## 2023-12-04 NOTE — Progress Notes (Signed)
 Cindy Sieving, MD  Cindy Arias Cc: Alona Corners, DO PROCEDURE / BIOPSY REVIEW Date: 12/04/23  Requested Biopsy site: liver Reason for request: r/o met Imaging review: Best seen on PET 11/27/23  Decision: Approved Imaging modality to perform: Ultrasound and CT  look with US  1st not sure if visible Schedule with: Moderate Sedation Schedule for: Any VIR  Additional comments:   Please contact me with questions, concerns, or if issue pertaining to this request arise.  Dayne Arias Johann, MD [facility]       Previous Messages    ----- Message ----- From: Alona Corners, DO Sent: 12/04/2023  11:40 AM EST To: Ir Procedure Requests Subject: FW:                                            Team can we take a look here? Thank you ----- Message ----- From: Lanny Callander, MD Sent: 12/04/2023  11:20 AM EST To: Corners Alona, DO  Jaime,  Her liver biopsy was negative, pancreatic biopsy confirmed adenocarcinoma. She wants to confirm if she has liver met, are you able to repeat liver biopsy? Liver lesion positive on PET  Thx  Callander

## 2023-12-04 NOTE — Telephone Encounter (Signed)
 POC testing requested by Dr. Maryalice Smaller and Ala Alice, RN.  Blood orders will be placed and POC testing will be performed and sent to [facility].  Results will come back and be uploaded to the patient chart and sent to Dr. Maryalice Smaller and RN Rufina Cough.

## 2023-12-04 NOTE — Addendum Note (Signed)
 Addended by: Sonja Silo on: 12/04/2023 01:04 PM   Modules accepted: Orders

## 2023-12-05 ENCOUNTER — Other Ambulatory Visit: Payer: Self-pay

## 2023-12-05 ENCOUNTER — Inpatient Hospital Stay: Payer: Medicare Other | Admitting: Licensed Clinical Social Worker

## 2023-12-05 DIAGNOSIS — C252 Malignant neoplasm of tail of pancreas: Secondary | ICD-10-CM

## 2023-12-09 ENCOUNTER — Encounter: Payer: Self-pay | Admitting: Hematology

## 2023-12-09 NOTE — Progress Notes (Signed)
 [facility] Clinical Social Work  Initial Assessment   Cindy SALVATIERRA is a 78 y.o. year old female contacted by phone. Clinical Social Work was referred by medical provider for assessment of psychosocial needs.   SDOH (Social Determinants of Health) assessments performed: Yes   SDOH Screenings   Food Insecurity: No Food Insecurity (11/11/2023)  Housing: Low Risk  (11/11/2023)  Transportation Needs: No Transportation Needs (11/11/2023)  Utilities: Not At Risk (11/11/2023)  Social Connections: Socially Integrated (11/11/2023)  Tobacco Use: Low Risk  (12/04/2023)     Distress Screen completed: Yes    11/13/2016   11:15 AM  ONCBCN DISTRESS SCREENING  Screening Type Initial Screening  Distress experienced in past week (1-10) 2  Family Problem type Partner  Physical Problem type Skin dry/itchy      Family/Social Information:  Housing Arrangement: patient lives with her husband.  Pt's husband has had multiple back surgeries.  Pt's husband is independent in ADLs, but does not drive.  Pt's home is handicapped equipped and has a stairlift.  Family members/support persons in your life? Pt reports she is very connected to her community and neighbors and has a great deal of support.  Pt also has a housekeeper that works a whole day every other week and can provide additional support if needed.   Transportation concerns: no  Employment: Retired .  Income source: Actor concerns: No Type of concern: None Food access concerns: no Religious or spiritual practice: Yes-Christian Advanced directives: Yes-pt states she has advanced directives completed Services Currently in place:  none  Coping/ Adjustment to diagnosis: Patient understands treatment plan and what happens next? yes Concerns about diagnosis and/or treatment: Overwhelmed by information Patient reported stressors: Adjusting to my illness Hopes and/or priorities: Pt's priority is to start treatment w/ the  hope of positive results Patient enjoys time with family/ friends Current coping skills/ strengths: Capable of independent living , Motivation for treatment/growth , Physical Health , and Supportive family/friends     SUMMARY: Current SDOH Barriers:  No barriers identified at this time.  Clinical Social Work Clinical Goal(s):  No clinical social work goals at this time  Interventions: Discussed common feeling and emotions when being diagnosed with cancer, and the importance of support during treatment Informed patient of the support team roles and support services at [facility] Provided CSW contact information and encouraged patient to call with any questions or concerns    Follow Up Plan: Patient will contact CSW with any support or resource needs Patient verbalizes understanding of plan: Yes    Devere JONELLE Manna, LCSW Clinical Social Worker [facility]

## 2023-12-10 ENCOUNTER — Encounter: Payer: Self-pay | Admitting: Nurse Practitioner

## 2023-12-10 ENCOUNTER — Other Ambulatory Visit: Payer: Self-pay | Admitting: Hematology

## 2023-12-11 ENCOUNTER — Ambulatory Visit (INDEPENDENT_AMBULATORY_CARE_PROVIDER_SITE_OTHER): Payer: Medicare Other | Admitting: Physician Assistant

## 2023-12-11 ENCOUNTER — Other Ambulatory Visit (INDEPENDENT_AMBULATORY_CARE_PROVIDER_SITE_OTHER): Payer: Medicare Other

## 2023-12-11 ENCOUNTER — Inpatient Hospital Stay: Payer: Medicare Other

## 2023-12-11 ENCOUNTER — Encounter: Payer: Self-pay | Admitting: Hematology

## 2023-12-11 ENCOUNTER — Encounter: Payer: Self-pay | Admitting: Physician Assistant

## 2023-12-11 ENCOUNTER — Other Ambulatory Visit: Payer: Self-pay

## 2023-12-11 DIAGNOSIS — R0781 Pleurodynia: Secondary | ICD-10-CM

## 2023-12-11 DIAGNOSIS — K8689 Other specified diseases of pancreas: Secondary | ICD-10-CM

## 2023-12-11 DIAGNOSIS — C252 Malignant neoplasm of tail of pancreas: Secondary | ICD-10-CM

## 2023-12-11 DIAGNOSIS — Z170 Estrogen receptor positive status [ER+]: Secondary | ICD-10-CM

## 2023-12-11 LAB — CMP (CANCER CENTER ONLY)
ALT: 26 U/L (ref 0–44)
AST: 27 U/L (ref 15–41)
Albumin: 4.7 g/dL (ref 3.5–5.0)
Alkaline Phosphatase: 107 U/L (ref 38–126)
Anion gap: 8 (ref 5–15)
BUN: 17 mg/dL (ref 8–23)
CO2: 27 mmol/L (ref 22–32)
Calcium: 9.9 mg/dL (ref 8.9–10.3)
Chloride: 104 mmol/L (ref 98–111)
Creatinine: 0.69 mg/dL (ref 0.44–1.00)
GFR, Estimated: >60 mL/min (ref >60)
Glucose, Bld: 100 mg/dL — ABNORMAL HIGH (ref 70–99)
Potassium: 4.2 mmol/L (ref 3.5–5.1)
Sodium: 139 mmol/L (ref 135–145)
Total Bilirubin: 0.4 mg/dL (ref 0.0–1.2)
Total Protein: 7.5 g/dL (ref 6.5–8.1)

## 2023-12-11 LAB — CBC WITH DIFFERENTIAL (CANCER CENTER ONLY)
Abs Immature Granulocytes: 0.02 10*3/uL (ref 0.00–0.07)
Basophils Absolute: 0.1 10*3/uL (ref 0.0–0.1)
Basophils Relative: 1 %
Eosinophils Absolute: 0.1 10*3/uL (ref 0.0–0.5)
Eosinophils Relative: 1 %
HCT: 42.1 % (ref 36.0–46.0)
Hemoglobin: 13.5 g/dL (ref 12.0–15.0)
Immature Granulocytes: 0 %
Lymphocytes Relative: 26 %
Lymphs Abs: 2 10*3/uL (ref 0.7–4.0)
MCH: 28 pg (ref 26.0–34.0)
MCHC: 32.1 g/dL (ref 30.0–36.0)
MCV: 87.3 fL (ref 80.0–100.0)
Monocytes Absolute: 0.6 10*3/uL (ref 0.1–1.0)
Monocytes Relative: 8 %
Neutro Abs: 4.9 10*3/uL (ref 1.7–7.7)
Neutrophils Relative %: 64 %
Platelet Count: 308 10*3/uL (ref 150–400)
RBC: 4.82 MIL/uL (ref 3.87–5.11)
RDW: 13.2 % (ref 11.5–15.5)
WBC Count: 7.6 10*3/uL (ref 4.0–10.5)
nRBC: 0 % (ref 0.0–0.2)

## 2023-12-11 LAB — GENETIC SCREENING ORDER

## 2023-12-11 MED ORDER — METHOCARBAMOL 750 MG PO TABS: 750.0000 mg | ORAL_TABLET | Freq: Three times a day (TID) | ORAL | 0 refills | Status: AC | PRN

## 2023-12-11 MED ORDER — LIDOCAINE 5 % EX PTCH: 1.0000 | MEDICATED_PATCH | CUTANEOUS | 2 refills | Status: DC

## 2023-12-11 NOTE — Progress Notes (Signed)
Office Visit Note   Patient: Cindy Arias           Date of Birth: 03-18-1946           MRN: [id number] Visit Date: 12/11/2023              Requested by: Gweneth Dimitri, MD [address] [place],  [place] [address] PCP: Gweneth Dimitri, MD  Chief Complaint  Patient presents with   Chest - Pain    Left sided rib pain      HPI: Ms. Peery is a pleasant 78 year old woman who is a longtime patient of Dr. Cleophas Dunker.  She is approximately 1 month status post falling down some stairs onto her ribs.  She was diagnosed with multiple rib fractures although did not have a pneumothorax.  CT scans while she was being evaluated for the trauma also revealed pancreatic cancer for which she is now getting treatment  Assessment & Plan: Visit Diagnoses:  1. Rib pain on left side     Plan: Multiple rib fractures.  She has good excursion with respiration no cough no shortness of breath.  X-rays today demonstrate multiple rib fractures.  Because of her recent cancer diagnosis at this point she just like to treat this conservatively.  I will refill her lidocaine patches as well as her Robaxin.  She may follow-up with me as needed  Follow-Up Instructions: No follow-ups on file.   Ortho Exam  Patient is alert, oriented, no adenopathy, well-dressed, normal affect, normal respiratory effort. Examination she is in no acute distress.  She has no cough no fever no chills.  She has good excursion with respiration.  Imaging: No results found. No images are attached to the encounter.  Labs: Lab Results  Component Value Date   REPTSTATUS 10/28/2018 FINAL 10/27/2018   CULT  10/27/2018    NO GROWTH Performed at [facility], [address], [place], [place] [address]      Lab Results  Component Value Date   ALBUMIN 4.7 12/11/2023   ALBUMIN 4.1 08/29/2021   ALBUMIN 3.9 08/29/2020    No results found for: "MG" Lab Results  Component Value Date   VD25OH 69.0 01/28/2018    VD25OH 45.5 04/29/2017    No results found for: "PREALBUMIN"    Latest Ref Rng & Units 12/11/2023   11:03 AM 11/15/2023    9:07 AM 11/12/2023    6:05 AM  CBC EXTENDED  WBC 4.0 - 10.5 K/uL 7.6  7.4  7.8   RBC 3.87 - 5.11 MIL/uL 4.82  4.63  4.19   Hemoglobin 12.0 - 15.0 g/dL 82.9  56.2  13.0   HCT 36.0 - 46.0 % 42.1  41.1  37.0   Platelets 150 - 400 K/uL 308  251  218   NEUT# 1.7 - 7.7 K/uL 4.9     Lymph# 0.7 - 4.0 K/uL 2.0        There is no height or weight on file to calculate BMI.  Orders:  Orders Placed This Encounter  Procedures   XR Ribs Unilateral Left   Meds ordered this encounter  Medications   methocarbamol (ROBAXIN) 750 MG tablet    Sig: Take 1 tablet (750 mg total) by mouth every 8 (eight) hours as needed for muscle spasms.    Dispense:  60 tablet    Refill:  0   lidocaine (LIDODERM) 5 %    Sig: Place 1 patch onto the skin daily. Remove & Discard  patch within 12 hours or as directed by MD    Dispense:  30 patch    Refill:  2     Procedures: No procedures performed  Clinical Data: No additional findings.  ROS:  All other systems negative, except as noted in the HPI. Review of Systems  Objective: Vital Signs: LMP 02/13/2012   Specialty Comments:  No specialty comments available.  PMFS History: Patient Active Problem List   Diagnosis Date Noted   Pancreatic cancer (HCC) 12/03/2023   Pancreatic mass 11/12/2023   Rib fractures 11/11/2023   Chronic rhinitis 10/02/2023   Other specified disorders of eustachian tube, bilateral 10/02/2023   Impacted cerumen of both ears 10/02/2023   Pain in right shoulder 04/17/2023   Callus between toes 01/24/2022   Hammertoe of second toe of right foot 01/11/2021   Pain in right foot 01/05/2020   Primary osteoarthritis of left knee 11/04/2018   History of left knee replacement 11/04/2018   Bilateral primary osteoarthritis of knee 02/21/2018   Carcinoma of central portion of right breast in female, estrogen  receptor positive (HCC) 12/21/2016   Malignant neoplasm of overlapping sites of right breast in female, estrogen receptor positive (HCC) 11/19/2016   Past Medical History:  Diagnosis Date   Arthritis    "bone on bone'  left knee   Asthma    LAST FLARE UP 3 YRS AGO   Breast cancer (HCC)    RIGHT BREAST   Colitis    Dysphagia    Dyspnea    NONE IN 3 YRS   Esophageal stricture    GERD (gastroesophageal reflux disease)    History of infertility    History of radiation therapy 12/31/16- 01/21/17   Right Breast 42.56 Gy in 16 fractions   Osteopenia    Pneumonia    Rosacea    Ulcerative colitis (HCC)     Family History  Problem Relation Age of Onset   Cancer Mother        Liver- 2003 Lymphoma   Rheum arthritis Mother    Stroke Father    Diabetes Maternal Grandfather        Type 2    Heart disease Maternal Grandfather    Hypertension Maternal Grandmother    Heart disease Maternal Grandmother    Osteoporosis Maternal Grandmother    Anesthesia problems Neg Hx     Past Surgical History:  Procedure Laterality Date   BREAST LUMPECTOMY WITH RADIOACTIVE SEED AND SENTINEL LYMPH NODE BIOPSY Right 11/21/2016   Procedure: BREAST LUMPECTOMY WITH RADIOACTIVE SEED AND SENTINEL LYMPH NODE BIOPSY;  Surgeon: Glenna Fellows, MD;  [facility];  Service: General;  Laterality: Right;   BREAST SURGERY     RIGHT BREAST LUMPECTOMY   BUNIONECTOMY WITH HAMMERTOE RECONSTRUCTION Left 07/2014   CATARACT EXTRACTION Right 10/2015   ESOPHAGEAL DILATION     ESOPHAGOGASTRODUODENOSCOPY N/A 11/25/2023   Procedure: ESOPHAGOGASTRODUODENOSCOPY (EGD);  Surgeon: Willis Modena, MD;  Location: [facility];  Service: Gastroenterology;  Laterality: N/A;   EYE SURGERY     OD   FINE NEEDLE ASPIRATION N/A 11/25/2023   Procedure: FINE NEEDLE ASPIRATION (FNA) LINEAR;  Surgeon: Willis Modena, MD;  Location: [facility];  Service: Gastroenterology;  Laterality: N/A;   JOINT REPLACEMENT      Repair of Chiari Malformation  02/05/13   SUBOCCIPITAL CRANIECTOMY CERVICAL LAMINECTOMY  02/13/2012   Procedure: SUBOCCIPITAL CRANIECTOMY CERVICAL LAMINECTOMY/DURAPLASTY;  Surgeon: Cristi Loron, MD;  Location: [facility];  Service: Neurosurgery;  Laterality: N/A;  Suboccipital craniectomy with upper cervical laminectomy and duraplasty   TOTAL KNEE ARTHROPLASTY Left 11/04/2018   TOTAL KNEE ARTHROPLASTY Left 11/04/2018   Procedure: LEFT TOTAL KNEE ARTHROPLASTY;  Surgeon: Valeria Batman, MD;  Location: [facility];  Service: Orthopedics;  Laterality: Left;   TUBAL LIGATION  1987   UPPER ESOPHAGEAL ENDOSCOPIC ULTRASOUND (EUS) N/A 11/25/2023   Procedure: UPPER ESOPHAGEAL ENDOSCOPIC ULTRASOUND (EUS);  Surgeon: Willis Modena, MD;  Location: [facility];  Service: Gastroenterology;  Laterality: N/A;   Social History   Occupational History   Not on file  Tobacco Use   Smoking status: Never   Smokeless tobacco: Never   Tobacco comments:    she smoked for a few months in college  Vaping Use   Vaping status: Never Used  Substance and Sexual Activity   Alcohol use: Yes    Comment: 1 glass of wine monthly   Drug use: No    Comment: Smoked in college ~ 2 years   Sexual activity: Not on file

## 2023-12-11 NOTE — Progress Notes (Signed)
Pt here today for pt chemo education with [name].  Pt requested if Cindy Arias would inform Dr. Latanya Maudlin office that the pt would like a referral to [facility] for a 2nd opinion.  Referral packet faxed to [facility] and images for diagnostic test pushed to [facility] to [facility] as well.  Fax confirmation received.

## 2023-12-12 LAB — CANCER ANTIGEN 19-9: CA 19-9: 16 U/mL (ref 0–35)

## 2023-12-16 NOTE — Progress Notes (Signed)
 Pharmacist Chemotherapy Monitoring - Initial Assessment    Anticipated start date: 12/23/23   The following has been reviewed per standard work regarding the patient's treatment regimen: The patient's diagnosis, treatment plan and drug doses, and organ/hematologic function Lab orders and baseline tests specific to treatment regimen  The treatment plan start date, drug sequencing, and pre-medications Prior authorization status  Patient's documented medication list, including drug-drug interaction screen and prescriptions for anti-emetics and supportive care specific to the treatment regimen The drug concentrations, fluid compatibility, administration routes, and timing of the medications to be used The patient's access for treatment and lifetime cumulative dose history, if applicable  The patient's medication allergies and previous infusion related reactions, if applicable   Changes made to treatment plan:  N/A  Follow up needed:  N/A   Janeice Robinson, RPH, 12/16/2023  11:39 AM

## 2023-12-18 ENCOUNTER — Encounter: Payer: Self-pay | Admitting: Genetic Counselor

## 2023-12-18 ENCOUNTER — Other Ambulatory Visit (HOSPITAL_COMMUNITY): Payer: Medicare Other

## 2023-12-18 ENCOUNTER — Telehealth: Payer: Self-pay | Admitting: Genetic Counselor

## 2023-12-18 ENCOUNTER — Other Ambulatory Visit: Payer: Self-pay

## 2023-12-18 ENCOUNTER — Telehealth: Payer: Self-pay

## 2023-12-18 NOTE — Telephone Encounter (Signed)
error 

## 2023-12-18 NOTE — Telephone Encounter (Signed)
 Revealed negative genetic testing on the BRCAPlus panel testing.  The remainder of testing is pending.

## 2023-12-18 NOTE — Telephone Encounter (Addendum)
 Called patient and relayed message below as per Dr. Mosetta Putt. Patient stated she didn't want to see a dietician. Patient wanted the number to contact genetics to get results gave her the number to them. Patient had no further questions.   ----- Message from Malachy Mood sent at 12/13/2023  9:58 AM EST ----- Please let pt know her lab results are good, and introduce yourself as my nurse and will meet her next time when she comes in, and ask if she wants to see dietician. She wants to meet her "treatment team", thanks   Malachy Mood

## 2023-12-19 ENCOUNTER — Other Ambulatory Visit (HOSPITAL_COMMUNITY): Payer: Self-pay | Admitting: Student

## 2023-12-19 DIAGNOSIS — C252 Malignant neoplasm of tail of pancreas: Secondary | ICD-10-CM

## 2023-12-19 NOTE — H&P (Incomplete)
 Chief Complaint: Patient was seen in consultation today for pancreatic cancer  Referring Physician(s): Feng,Yan  Supervising Physician: Richarda Overlie  Patient Status: [facility] - Out-pt  History of Present Illness: Cindy Arias is a 78 y.o. female with past medical history of asthma, right breast cancer s/p treatment, GERD, ulcerative colitis recently known to IR from liver mass biopsy 11/25/23 by Dr. Loreta Ave.  Sample diagnostic for normal live parenchyma.  Subsequent PET imaging again shows 2 areas of hypermetabolic activity in the liver concerning for metastasis.  IR consulted for repeat biopsy at the request of Dr. Mosetta Putt.  Case reviewed and approved by Dr. Deanne Coffer.  In addition to biopsy, patient also in need of durable venous access for upcoming chemotherapy.  Port-A-Cath also requested.  Patient presents for biopsy today in her usual state of health.  She has been NPO.  She does not take blood thinners.  She is understanding of the two proposed procedures today and the indication for repeat biopsy as well as the challenge of her imaging findings.  She is agreeable to proceed.   Past Medical History:  Diagnosis Date   Arthritis    "bone on bone'  left knee   Asthma    LAST FLARE UP 3 YRS AGO   Breast cancer (HCC)    RIGHT BREAST   Colitis    Dysphagia    Dyspnea    NONE IN 3 YRS   Esophageal stricture    GERD (gastroesophageal reflux disease)    History of infertility    History of radiation therapy 12/31/16- 01/21/17   Right Breast 42.56 Gy in 16 fractions   Osteopenia    Pneumonia    Rosacea    Ulcerative colitis (HCC)     Past Surgical History:  Procedure Laterality Date   BREAST LUMPECTOMY WITH RADIOACTIVE SEED AND SENTINEL LYMPH NODE BIOPSY Right 11/21/2016   Procedure: BREAST LUMPECTOMY WITH RADIOACTIVE SEED AND SENTINEL LYMPH NODE BIOPSY;  Surgeon: Glenna Fellows, MD;  Location: [facility];  Service: General;  Laterality: Right;   BREAST SURGERY      RIGHT BREAST LUMPECTOMY   BUNIONECTOMY WITH HAMMERTOE RECONSTRUCTION Left 07/2014   CATARACT EXTRACTION Right 10/2015   ESOPHAGEAL DILATION     ESOPHAGOGASTRODUODENOSCOPY N/A 11/25/2023   Procedure: ESOPHAGOGASTRODUODENOSCOPY (EGD);  Surgeon: Willis Modena, MD;  Location: [facility];  Service: Gastroenterology;  Laterality: N/A;   EYE SURGERY     OD   FINE NEEDLE ASPIRATION N/A 11/25/2023   Procedure: FINE NEEDLE ASPIRATION (FNA) LINEAR;  Surgeon: Willis Modena, MD;  Location: [facility];  Service: Gastroenterology;  Laterality: N/A;   JOINT REPLACEMENT     Repair of Chiari Malformation  02/05/13   SUBOCCIPITAL CRANIECTOMY CERVICAL LAMINECTOMY  02/13/2012   Procedure: SUBOCCIPITAL CRANIECTOMY CERVICAL LAMINECTOMY/DURAPLASTY;  Surgeon: Cristi Loron, MD;  Location: [facility];  Service: Neurosurgery;  Laterality: N/A;  Suboccipital craniectomy with upper cervical laminectomy and duraplasty   TOTAL KNEE ARTHROPLASTY Left 11/04/2018   TOTAL KNEE ARTHROPLASTY Left 11/04/2018   Procedure: LEFT TOTAL KNEE ARTHROPLASTY;  Surgeon: Valeria Batman, MD;  Location: [facility];  Service: Orthopedics;  Laterality: Left;   TUBAL LIGATION  1987   UPPER ESOPHAGEAL ENDOSCOPIC ULTRASOUND (EUS) N/A 11/25/2023   Procedure: UPPER ESOPHAGEAL ENDOSCOPIC ULTRASOUND (EUS);  Surgeon: Willis Modena, MD;  Location: [facility];  Service: Gastroenterology;  Laterality: N/A;    Allergies: Penicillins, Sulfa antibiotics, and Albuterol  Medications: Prior to Admission medications   Medication Sig Start Date End  Date Taking? Authorizing Provider  acetaminophen (TYLENOL) 500 MG tablet Take 2 tablets (1,000 mg total) by mouth every 8 (eight) hours as needed. 11/13/23   Maczis, Elmer Sow, PA-C  budesonide-formoterol (SYMBICORT) 160-4.5 MCG/ACT inhaler Inhale 2 puffs into the lungs in the morning and at bedtime. 08/06/23   Kozlow, Alvira Philips, MD  CALCIUM CITRATE-VITAMIN D PO Take 1 tablet by mouth daily.     [provider]  fluticasone (FLONASE) 50 MCG/ACT nasal spray Place 2 sprays into both nostrils daily as needed for allergies or rhinitis.    [provider]  levalbuterol Pauline Aus HFA) 45 MCG/ACT inhaler Inhale 2 puffs into the lungs See admin instructions. Every 4-6 hours prn for sob, cough    [provider]  lidocaine (LIDODERM) 5 % Place 1 patch onto the skin daily. Remove & Discard patch within 12 hours or as directed by MD 12/11/23   Persons, [place], PA  lidocaine-prilocaine (EMLA) cream Apply to affected area once 12/04/23   Malachy Mood, MD  mesalamine (LIALDA) 1.2 G EC tablet Take 1.2 g by mouth daily with breakfast.     [provider]  methocarbamol (ROBAXIN) 750 MG tablet Take 1 tablet (750 mg total) by mouth every 8 (eight) hours as needed for muscle spasms. 12/11/23   Persons, [place], PA  Multiple Vitamins-Iron (MULTIVITAMINS WITH IRON) TABS Take 1 tablet by mouth daily.    [provider]  omeprazole (PRILOSEC) 40 MG capsule Take 1 capsule (40 mg total) by mouth daily. Take 1 capsule by mouth 1-2 times a day 08/06/23   Kozlow, Alvira Philips, MD  ondansetron (ZOFRAN) 8 MG tablet Take 1 tablet (8 mg total) by mouth every 8 (eight) hours as needed for nausea or vomiting. 12/04/23   Malachy Mood, MD  oxyCODONE (OXY IR/ROXICODONE) 5 MG immediate release tablet Take 1-1.5 tablets (5-7.5 mg total) by mouth every 6 (six) hours as needed for breakthrough pain. 11/13/23   Maczis, Elmer Sow, PA-C  prochlorperazine (COMPAZINE) 10 MG tablet Take 1 tablet (10 mg total) by mouth every 6 (six) hours as needed for nausea or vomiting. 12/04/23   Malachy Mood, MD  vitamin E 400 UNIT capsule Take 400 Units by mouth daily.    [provider]     Family History  Problem Relation Age of Onset   Cancer Mother        Liver- 2003 Lymphoma   Rheum arthritis Mother    Non-Hodgkin's lymphoma Mother        dx. 50s   Stroke Father    Hypertension Maternal Grandmother    Heart disease  Maternal Grandmother    Osteoporosis Maternal Grandmother    Diabetes Maternal Grandfather        Type 2    Heart disease Maternal Grandfather    Heart disease Paternal Grandmother        d. 11s   Melanoma Daughter 21       on face   Anesthesia problems Neg Hx     Social History   Socioeconomic History   Marital status: Married    Spouse name: Not on file   Number of children: Not on file   Years of education: Not on file   Highest education level: Not on file  Occupational History   Not on file  Tobacco Use   Smoking status: Never   Smokeless tobacco: Never   Tobacco comments:    she smoked for a few months in college  Vaping Use   Vaping status: Never Used  Substance and Sexual Activity   Alcohol use: Yes    Comment: 1 glass of wine monthly   Drug use: No    Comment: Smoked in college ~ 2 years   Sexual activity: Not on file  Other Topics Concern   Not on file  Social History Narrative   Not on file   Social Drivers of Health   Financial Resource Strain: Not on file  Food Insecurity: No Food Insecurity (11/11/2023)   Hunger Vital Sign    Worried About Running Out of Food in the Last Year: Never true    Ran Out of Food in the Last Year: Never true  Transportation Needs: No Transportation Needs (11/11/2023)   PRAPARE - Administrator, Civil Service (Medical): No    Lack of Transportation (Non-Medical): No  Physical Activity: Not on file  Stress: Not on file  Social Connections: Socially Integrated (11/11/2023)   Social Connection and Isolation Panel [NHANES]    Frequency of Communication with Friends and Family: Three times a week    Frequency of Social Gatherings with Friends and Family: Three times a week    Attends Religious Services: More than 4 times per year    Active Member of Clubs or Organizations: Yes    Attends Banker Meetings: More than 4 times per year    Marital Status: Married     Review of Systems: A 12 point ROS  discussed and pertinent positives are indicated in the HPI above.  All other systems are negative.  Review of Systems  Constitutional:  Negative for fatigue and fever.  Respiratory:  Negative for cough and shortness of breath.   Cardiovascular:  Negative for chest pain.  Gastrointestinal:  Negative for abdominal pain, nausea and vomiting.  Musculoskeletal:  Negative for back pain.  Skin:  Negative for pallor and wound.  Psychiatric/Behavioral:  Negative for behavioral problems and confusion.     Vital Signs: LMP 02/13/2012   Physical Exam Vitals and nursing note reviewed.  Constitutional:      General: She is not in acute distress.    Appearance: Normal appearance. She is not ill-appearing.  HENT:     Mouth/Throat:     Mouth: Mucous membranes are moist.     Pharynx: Oropharynx is clear.  Cardiovascular:     Rate and Rhythm: Normal rate and regular rhythm.  Pulmonary:     Effort: Pulmonary effort is normal. No respiratory distress.     Breath sounds: Normal breath sounds.  Abdominal:     General: Abdomen is flat. There is no distension.     Palpations: Abdomen is soft.  Musculoskeletal:     Cervical back: Normal range of motion and neck supple.  Skin:    General: Skin is warm and dry.  Neurological:     General: No focal deficit present.     Mental Status: She is alert and oriented to person, place, and time. Mental status is at baseline.  Psychiatric:        Mood and Affect: Mood normal.        Behavior: Behavior normal.        Thought Content: Thought content normal.        Judgment: Judgment normal.      MD Evaluation Airway: WNL Heart: WNL Abdomen: WNL Chest/ Lungs: WNL ASA  Classification: 3 Mallampati/Airway Score: Two   Imaging: XR Ribs Unilateral Left Result Date: 12/11/2023 Radiographs of  the left rib cage demonstrate multiple nondisplaced and displaced rib fractures no evidence of pneumothorax  NM PET Image Initial (PI) Skull Base To Thigh (F-18  FDG) Result Date: 12/04/2023 CLINICAL DATA:  Initial treatment strategy for pancreatic cancer. EXAM: NUCLEAR MEDICINE PET SKULL BASE TO THIGH TECHNIQUE: mCi F-18 FDG was injected intravenously. Full-ring PET imaging was performed from the skull base to thigh after the radiotracer. CT data was obtained and used for attenuation correction and anatomic localization. Fasting blood glucose: 5.5 96 mg/dl COMPARISON:  CT scan 47/42/5956 and MRI 11/12/2023 FINDINGS: Mediastinal blood pool activity: SUV max 2.0 Liver activity: SUV max NA NECK: No significant abnormal hypermetabolic activity in this region. Incidental CT findings: Left greater than right common carotid atheromatous vascular calcifications. CHEST: No significant abnormal hypermetabolic activity in this region. Incidental CT findings: Pleural thickening in the vicinity of the multiple left rib fractures. The left third through eighth rib fractures are segmental with contour abnormality of the left posterior chest resulting from the segmental and displaced fractures. Left medial ninth and tenth rib fractures are also observed. Flail chest not excluded. ABDOMEN/PELVIS: The pancreatic tail mass has a maximum SUV of 18.7, hypermetabolic activity measures approximately 3.4 by 1.9 cm. A hypermetabolic lesion thought to likely be in segment 2 of the liver has a maximum SUV of 9.1. A hypermetabolic lesion in segment 4a has a maximum SUV of 6.9. The more peripheral subcapsular right hepatic lobe lesion is not hypermetabolic and may reflect a hemangioma. The other smaller lesions seen on MRI are not readily appreciable but may be too small to characterize. Incidental CT findings: Atherosclerosis is present, including aortoiliac atherosclerotic disease. SKELETON: Expected century data activity at the site of the acute rib fractures. Otherwise unremarkable Incidental CT findings: Levoconvex lumbar scoliosis with rotary component. Grade 1 anterolisthesis at L4-5.  IMPRESSION: 1. The pancreatic tail mass is hypermetabolic with a maximum SUV of 18.7, compatible with malignancy. 2. There are 2 hypermetabolic liver lesions, one in segment 2 and the other in segment 4a, consistent with metastatic disease. The other smaller lesions seen on MRI are not readily appreciable but may be too small to characterize. Also the peripheral subcapsular right hepatic lobe lesion in the far right side of the liver is probably a hemangioma based on lack of metabolic activity. 3. Numerous left rib fractures as mentioned on prior exams, including segmental fractures of the third through eighth ribs (various fractures are substantially displaced) and individual fractures of the left ninth and tenth ribs, with some flattening of the left posterior hemithorax resulting; flail chest not excluded. 4. Levoconvex lumbar scoliosis with rotary component. Grade 1 anterolisthesis at L4-5. 5.  Aortic Atherosclerosis (ICD10-I70.0). Electronically Signed   By: Gaylyn Rong M.D.   On: 12/04/2023 17:05    Labs:  CBC: Recent Labs    11/12/23 0605 11/15/23 0907 12/11/23 1103 12/20/23 0704  WBC 7.8 7.4 7.6 5.6  HGB 12.2 13.5 13.5 12.5  HCT 37.0 41.1 42.1 38.6  PLT 218 251 308 223    COAGS: Recent Labs    11/15/23 0907 12/20/23 0704  INR 1.1 1.0    BMP: Recent Labs    11/11/23 0936 11/12/23 0605 12/11/23 1103  NA 139 139 139  K 4.1 4.3 4.2  CL 105 106 104  CO2 26 26 27   GLUCOSE 123* 114* 100*  BUN 15 12 17   CALCIUM 9.2 8.6* 9.9  CREATININE 0.69 0.78 0.69  GFRNONAA >60 >60 >60    LIVER FUNCTION TESTS:  Recent Labs    12/11/23 1103  BILITOT 0.4  AST 27  ALT 26  ALKPHOS 107  PROT 7.5  ALBUMIN 4.7    TUMOR MARKERS: No results for input(s): "AFPTM", "CEA", "CA199", "CHROMGRNA" in the last 8760 hours.  Assessment and Plan: Patient with past medical history of ulcerative colitis, prior breast cancer s/p treatment now presents with complaint of newly diangostic  pancreatic cancer with concern for liver metastasis despite recently negative targeted liver biopsy.  IR consulted for biopsy and Port placement at the request of Dr. Mosetta Putt. Case reviewed by Dr. Deanne Coffer who approves patient for procedure.  Patient presents today in their usual state of health.  She has been NPO and is not currently on blood thinners.   Risks and benefits of image guided port-a-catheter placement was discussed with the patient including, but not limited to bleeding, infection, pneumothorax, or fibrin sheath development and need for additional procedures.  All of the patient's questions were answered, patient is agreeable to proceed. Consent signed and in chart.  Advance Care Plan: The advanced care plan/surrogate decision maker was discussed at the time of visit and documented in the medical record.     Thank you for this interesting consult.  I greatly enjoyed meeting GWENETTA DEVOS and look forward to participating in their care.  A copy of this report was sent to the requesting provider on this date.  Electronically Signed: Hoyt Koch, PA 12/20/2023, 8:42 AM   I spent a total of  30 Minutes   in face to face in clinical consultation, greater than 50% of which was counseling/coordinating care for liver lesions, pancreatic cancer.

## 2023-12-19 NOTE — Assessment & Plan Note (Deleted)
 [id number] with liver metastasis  -diagnosed in 10/2023. Incidentally discovered pancreatic mass and liver lesions during rib fracture evaluation. Liver biopsy negative for malignancy, but suspicion remains.  -confirmed by EUS pancreatic mass biopsy by Dr. Dulce Sellar  -PET scan reviewed liver mets  -reviewed incurable nature of her cancer and treatment options  -genetic test is negative -I recommend chemo gemcitabine and abraxane

## 2023-12-20 ENCOUNTER — Ambulatory Visit (HOSPITAL_COMMUNITY)
Admission: RE | Admit: 2023-12-20 | Discharge: 2023-12-20 | Disposition: A | Discharge status: Home / Self Care | Payer: Medicare Other | Source: Ambulatory Visit | Attending: Hematology | Admitting: Hematology

## 2023-12-20 ENCOUNTER — Other Ambulatory Visit: Payer: Self-pay

## 2023-12-20 ENCOUNTER — Other Ambulatory Visit (HOSPITAL_COMMUNITY): Payer: Self-pay | Admitting: Hematology

## 2023-12-20 ENCOUNTER — Encounter: Payer: Self-pay | Admitting: Genetic Counselor

## 2023-12-20 ENCOUNTER — Telehealth: Payer: Self-pay | Admitting: Hematology

## 2023-12-20 ENCOUNTER — Encounter (HOSPITAL_COMMUNITY): Payer: Self-pay

## 2023-12-20 ENCOUNTER — Other Ambulatory Visit: Payer: Self-pay | Admitting: Hematology

## 2023-12-20 ENCOUNTER — Telehealth: Payer: Self-pay | Admitting: Genetic Counselor

## 2023-12-20 DIAGNOSIS — C787 Secondary malignant neoplasm of liver and intrahepatic bile duct: Secondary | ICD-10-CM | POA: Insufficient documentation

## 2023-12-20 DIAGNOSIS — C252 Malignant neoplasm of tail of pancreas: Secondary | ICD-10-CM | POA: Insufficient documentation

## 2023-12-20 DIAGNOSIS — Z170 Estrogen receptor positive status [ER+]: Secondary | ICD-10-CM

## 2023-12-20 DIAGNOSIS — Z87891 Personal history of nicotine dependence: Secondary | ICD-10-CM | POA: Diagnosis not present

## 2023-12-20 DIAGNOSIS — Z1379 Encounter for other screening for genetic and chromosomal anomalies: Secondary | ICD-10-CM | POA: Insufficient documentation

## 2023-12-20 HISTORY — PX: IR IMAGING GUIDED PORT INSERTION: IMG5740

## 2023-12-20 LAB — PROTIME-INR
INR: 1 (ref 0.8–1.2)
Prothrombin Time: 13.8 s (ref 11.4–15.2)

## 2023-12-20 LAB — CBC
HCT: 38.6 % (ref 36.0–46.0)
Hemoglobin: 12.5 g/dL (ref 12.0–15.0)
MCH: 28.5 pg (ref 26.0–34.0)
MCHC: 32.4 g/dL (ref 30.0–36.0)
MCV: 88.1 fL (ref 80.0–100.0)
Platelets: 223 10*3/uL (ref 150–400)
RBC: 4.38 MIL/uL (ref 3.87–5.11)
RDW: 13.3 % (ref 11.5–15.5)
WBC: 5.6 10*3/uL (ref 4.0–10.5)
nRBC: 0 % (ref 0.0–0.2)

## 2023-12-20 MED ORDER — HEPARIN SOD (PORK) LOCK FLUSH 100 UNIT/ML IV SOLN
INTRAVENOUS | Status: AC | PRN
  Administered 2023-12-20: 500 [IU] via INTRAVENOUS

## 2023-12-20 MED ORDER — FENTANYL CITRATE (PF) 100 MCG/2ML IJ SOLN
INTRAMUSCULAR | Status: AC | PRN
  Administered 2023-12-20: 25 ug via INTRAVENOUS
  Administered 2023-12-20: 50 ug via INTRAVENOUS

## 2023-12-20 MED ORDER — HEPARIN SOD (PORK) LOCK FLUSH 100 UNIT/ML IV SOLN
INTRAVENOUS | Status: AC
  Filled 2023-12-20: qty 5

## 2023-12-20 MED ORDER — HEPARIN SOD (PORK) LOCK FLUSH 100 UNIT/ML IV SOLN
500.0000 [IU] | Freq: Once | INTRAVENOUS | Status: AC
  Administered 2023-12-20: 500 [IU] via INTRAVENOUS

## 2023-12-20 MED ORDER — LIDOCAINE HCL 1 % IJ SOLN
INTRAMUSCULAR | Status: AC
  Filled 2023-12-20: qty 20

## 2023-12-20 MED ORDER — MIDAZOLAM HCL 2 MG/2ML IJ SOLN
INTRAMUSCULAR | Status: AC | PRN
  Administered 2023-12-20 (×5): .5 mg via INTRAVENOUS

## 2023-12-20 MED ORDER — LIDOCAINE-EPINEPHRINE 1 %-1:100000 IJ SOLN
20.0000 mL | Freq: Once | INTRAMUSCULAR | Status: AC
  Administered 2023-12-20: 20 mL via INTRADERMAL

## 2023-12-20 MED ORDER — LIDOCAINE HCL (PF) 1 % IJ SOLN
10.0000 mL | Freq: Once | INTRAMUSCULAR | Status: DC
  Filled 2023-12-20: qty 10

## 2023-12-20 MED ORDER — FENTANYL CITRATE (PF) 100 MCG/2ML IJ SOLN
INTRAMUSCULAR | Status: AC
Start: 2023-12-20 — End: ?
  Filled 2023-12-20: qty 4

## 2023-12-20 MED ORDER — FENTANYL CITRATE (PF) 100 MCG/2ML IJ SOLN
INTRAMUSCULAR | Status: AC | PRN
  Administered 2023-12-20 (×5): 25 ug via INTRAVENOUS

## 2023-12-20 MED ORDER — LIDOCAINE-EPINEPHRINE 1 %-1:100000 IJ SOLN
INTRAMUSCULAR | Status: AC
  Filled 2023-12-20: qty 1

## 2023-12-20 MED ORDER — HYDROCODONE-ACETAMINOPHEN 5-325 MG PO TABS: 1.0000 | ORAL_TABLET | ORAL | Status: DC | PRN

## 2023-12-20 MED ORDER — MIDAZOLAM HCL 2 MG/2ML IJ SOLN
INTRAMUSCULAR | Status: AC
Start: 2023-12-20 — End: ?
  Filled 2023-12-20: qty 4

## 2023-12-20 MED ORDER — LIDOCAINE HCL 1 % IJ SOLN
20.0000 mL | Freq: Once | INTRAMUSCULAR | Status: AC
  Administered 2023-12-20: 10 mL via INTRADERMAL

## 2023-12-20 MED ORDER — MIDAZOLAM HCL 2 MG/2ML IJ SOLN
INTRAMUSCULAR | Status: AC | PRN
  Administered 2023-12-20 (×3): .5 mg via INTRAVENOUS

## 2023-12-20 NOTE — Telephone Encounter (Signed)
 Patient called in wanting to cancel her appointments on 12/23/2023, patient was offered to be rescheduled at next date in care plan which is 12/30/2023 but patient also refused that date as well, patient and patient's daughter both agreed they would callback when ready to reschedule and requested all future appointments in treatment plan be cancelled as well, message has been forwarded over to desk nurse and providers

## 2023-12-20 NOTE — Telephone Encounter (Signed)
 Revealed negative genetic testing.  Discussed that we do not know why she has Pancreatic cancer or why there is cancer in the family. It could be due to a different gene that we are not testing, or maybe our current technology may not be able to pick something up.  It will be important for her to keep in contact with genetics to keep up with whether additional testing may be needed.

## 2023-12-20 NOTE — Procedures (Signed)
 Interventional Radiology Procedure:   Indications: Pancreatic cancer with liver lesions  Procedure: Port placement and CT guided liver lesion biopsy  Findings: Right jugular port, tip at SVC/RA junction. 2 core biopsies obtained small lesion in anterior right hepatic lobe.   Complications: None     EBL: Minimal, less than 10 ml  Plan: Discharge in 3 hours.  Keep port site and incisions dry for at least 24 hours.     Mackson Botz R. Lowella Dandy, MD  Pager: [phone]

## 2023-12-22 ENCOUNTER — Telehealth: Payer: Self-pay

## 2023-12-22 NOTE — Telephone Encounter (Signed)
 Patient called in as she had a port placed on Friday and the builk bandage is still on and making her itch. Messed with radiologist Dr  Lowella Dandy who stated the bandage could come off. The patient is aware and thanks all for her care.

## 2023-12-23 ENCOUNTER — Inpatient Hospital Stay: Payer: Medicare Other | Admitting: Hematology

## 2023-12-23 ENCOUNTER — Inpatient Hospital Stay: Payer: Medicare Other

## 2023-12-23 ENCOUNTER — Other Ambulatory Visit: Payer: Self-pay | Admitting: Hematology

## 2023-12-23 DIAGNOSIS — C252 Malignant neoplasm of tail of pancreas: Secondary | ICD-10-CM

## 2023-12-24 ENCOUNTER — Other Ambulatory Visit (HOSPITAL_COMMUNITY): Payer: Medicare Other

## 2023-12-27 ENCOUNTER — Encounter: Payer: Self-pay | Admitting: Hematology

## 2023-12-27 LAB — SURGICAL PATHOLOGY

## 2023-12-28 ENCOUNTER — Encounter: Payer: Self-pay | Admitting: Hematology

## 2023-12-30 ENCOUNTER — Ambulatory Visit: Payer: Medicare Other | Admitting: Nurse Practitioner

## 2023-12-30 ENCOUNTER — Ambulatory Visit: Payer: Medicare Other

## 2023-12-30 ENCOUNTER — Other Ambulatory Visit: Payer: Medicare Other

## 2024-01-13 ENCOUNTER — Other Ambulatory Visit: Payer: Medicare Other

## 2024-01-13 ENCOUNTER — Ambulatory Visit: Payer: Medicare Other | Admitting: Hematology

## 2024-01-13 ENCOUNTER — Ambulatory Visit: Payer: Medicare Other

## 2024-01-20 ENCOUNTER — Ambulatory Visit: Payer: Medicare Other | Admitting: Hematology

## 2024-01-20 ENCOUNTER — Ambulatory Visit: Payer: Medicare Other

## 2024-01-20 ENCOUNTER — Other Ambulatory Visit: Payer: Medicare Other

## 2024-01-28 NOTE — Addendum Note (Signed)
 Encounter addended by: Edward Qualia on: 01/28/2024 1:00 PM  Actions taken: Imaging Exam ended

## 2024-02-03 ENCOUNTER — Ambulatory Visit: Payer: Medicare Other

## 2024-02-03 ENCOUNTER — Other Ambulatory Visit: Payer: Medicare Other

## 2024-02-03 ENCOUNTER — Ambulatory Visit: Payer: Medicare Other | Admitting: Nurse Practitioner

## 2024-02-10 ENCOUNTER — Other Ambulatory Visit: Payer: Medicare Other

## 2024-02-10 ENCOUNTER — Ambulatory Visit: Payer: Medicare Other

## 2024-02-10 ENCOUNTER — Ambulatory Visit: Payer: Medicare Other | Admitting: Hematology

## 2024-05-14 NOTE — Telephone Encounter (Signed)
 Writer contacted patient and relayed Dr [employer].  Patient acknowledged understanding and expressed thanks.

## 2024-05-19 NOTE — Progress Notes (Signed)
 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 female with a pancreatic mass and liver mets diagnosed in January 2025. Past medical history significant for breast cancer.  She initially presented after a fall at home to the emergency room and during that evaluation was incidentally found to have a pancreatic mass.  This led to biopsy of the pancreatic mass as well as PET scan which noted liver lesions.  Initial biopsy of the pancreas at outside institution was read as adenocarcinoma but was reread here at [facility] only as malignant without clear definition of adenocarcinoma.  She had a liver biopsy at an outside institution which was nondiagnostic.  She had a repeat liver biopsy that was done and read is pending.  However at time of visit patient's friend had called pathology at [facility] health and had stated that this was most likely a neuroendocrine tumor.  At time of my visit we do not have definitive pathology for the most recent liver biopsy but if this is neuroendocrine this would drastically change her treatment paradigm.  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  PET dotatate showed metastatic disease to the liver.  She was started on lanreotide in February 2025.  She had imaging done in May 2025 which unfortunately showed progressive disease.  We discussed this at length and recommended cabozantinib based off of the CABINET Trial.   Lutathera consult on 05/12/24 and scheduled for first treatment on 06/23/24. Cabozantinib discontinued on 05/19/24.  TODAY'S VISIT: Cindy Arias presents today for follow up for pancreatic neuroendocrine tumor. She reports slight fatigue but is able to complete all ADLs  independently. She is eating well and denies nausea or vomiting. She reports some hoarseness but denies any pain with this. She reports good compliance with cabozantinib. She denies any changes in bowel habits and denies hematochezia or melena. She has checked blood pressure daily and reports slightly increased readings with systolic readings in the 140s. She denies any headaches or changes in vision and is not on any blood pressure medications. She denies fevers, chills, chest pain, cough, shortness of breath, or pain. She denies any other complaints today.   PAST MEDICAL HISTORY: Breast cancer  PAST SURGICAL HISTORY: Knee replacement  ALLERGIES Allergies  Allergen Reactions  . Albuterol Other (See Comments) and Palpitations    Tachycardia  UNSPECIFIED REACTION   States she cannot tolerate albuterol without side effects  . Penicillins Hives, Itching, Other (See Comments), Rash and Swelling    UNSPECIFIED CHILDHOOD REACTION   Had reaction as a child and as an adult. Had additional reaction but not clear on the specifics  . Sulfa (Sulfonamide Antibiotics) Hives and Rash  . Sulfur  Itching and Swelling    CURRENT MEDICATIONS Current Outpatient Medications  Medication Sig Dispense Refill  . acetaminophen  (TYLENOL ) 500 mg tablet Take 1,000 mg by mouth every 4 (four) hours as needed.    . cabozantinib (CABOMETYX) 40 mg chemo tablet Take 1 tablet (40 mg total) by mouth daily. 30 tablet 11  . cholecalciferol 25 mcg (1,000 unit) cap Take 1,000 Units by mouth daily.    . fluticasone  propionate (FLONASE ) 50 mcg/spray nasal spray Administer 2 sprays into affected nostril(s).    . levalbuterol  (XOPENEX  HFA) 45 mcg/actuation inhaler Inhale 2 puffs.    SABRA  mesalamine  (LIALDA ) 1.2 gram EC tablet Take 1.2 g by mouth.    . methocarbamoL  (ROBAXIN ) 750 mg tablet Take 750 mg by mouth.    . omeprazole  (PriLOSEC) 40 mg DR capsule Take 40 mg by mouth.    . ondansetron  (ZOFRAN ) 8 mg tablet Take 1 tablet (8 mg  total) by mouth every 8 (eight) hours as needed for nausea or vomiting. 60 tablet 3  . prochlorperazine  (COMPAZINE ) 10 mg tablet Take 10 mg by mouth every 6 (six) hours as needed.    . Symbicort  160-4.5 mcg/actuation inhaler Inhale 2 puffs into the lungs in the morning and at bedtime.    . urea (AQUA CARE) 10 % cream Apply to hands and feet twice daily as needed 85 g 1   No current facility-administered medications for this visit.    FAMILY HISTORY Family History  Problem Relation Name Age of Onset  . Cancer Mother Cindy Arias     SOCIAL HISTORY Social History   Socioeconomic History  . Marital status: Married    Spouse name: Not on file  . Number of children: Not on file  . Years of education: Not on file  . Highest education level: Not on file  Occupational History  . Not on file  Tobacco Use  . Smoking status: Never  . Smokeless tobacco: Never  Vaping Use  . Vaping status: Not on file  Substance and Sexual Activity  . Alcohol use: Yes    Alcohol/week: 1.0 standard drink of alcohol    Types: 1 Glasses of wine per week  . Drug use: Never  . Sexual activity: Not Currently    Partners: Male    Birth control/protection: Post-menopausal  Other Topics Concern  . Not on file  Social History Narrative  . Not on file   Social Drivers of Health   Food Insecurity: No Food Insecurity (11/11/2023)   Received from [facility]   Food vital sign   . Within the past 12 months, you worried that your food would run out before you got money to buy more: Never true   . Within the past 12 months, the food you bought just didn't last and you didn't have money to get more: Never true  Transportation Needs: No Transportation Needs (11/11/2023)   Received from [facility] - Transportation   . Lack of Transportation (Medical): No   . Lack of Transportation (Non-Medical): No  Safety: Not At Risk (11/11/2023)   Received from [facility]   Safety   . Within the last year, have  you been afraid of your partner or ex-partner?: No   . Within the last year, have you been humiliated or emotionally abused in other ways by your partner or ex-partner?: No   . Within the last year, have you been kicked, hit, slapped, or otherwise physically hurt by your partner or ex-partner?: No   . Within the last year, have you been raped or forced to have any kind of sexual activity by your partner or ex-partner?: No  Living Situation: Low Risk  (11/11/2023)   Received from [facility] Situation   . In the last 12 months, was there a time when you were not able to pay the mortgage or rent on time?: No   . In the past 12 months, how many times have you moved where you were living?: 1   . At any time in the past 12 months, were you homeless or  living in a shelter (including now)?: No    Review of systems: 12 point ROS obtained and negative if not mentioned above.   PHYSICAL EXAMINATION: VITAL SIGNS:  Vitals:   05/19/24 0839  BP: 143/70  BP Location: Left arm  Patient Position: Sitting  Pulse: 63  Resp: 16  Temp: 97.4 F (36.3 C)  TempSrc: Temporal  SpO2: 99%  Weight: 50.6 kg (111 lb 9.6 oz)    Wt Readings from Last 3 Encounters:  05/19/24 50.6 kg (111 lb 9.6 oz)  05/12/24 51.7 kg (114 lb)  04/21/24 51.8 kg (114 lb 1.6 oz)    ECOG performance status: 0 - Asymptomatic Karnofsky Performance Scale:  90% - Able to carry on normal activity; minor signs or symptoms of disease  GENERAL: No apparent distress. Seated comfortably in exam chair. Ambulates independently.  SKIN: No lesions, or rash. HEENT: Anicteric sclera.  RESPIRATORY: Clear to auscultation bilaterally. Normal work of breathing. CARDIOVASCULAR: RRR, no m/r/g. No peripheral edema noted.  ABDOMEN: Symmetric. Nontender to palpation. No hepatosplenomegaly noted  EXTREMITIES: No cyanosis, clubbing or edema.  NEUROLOGIC: Nonfocal. Alert and oriented x 3.   LABORATORY STUDIES: Recent Results (from the past  240 hours)  CBC with Differential   Collection Time: 05/19/24  8:26 AM  Result Value Ref Range   WBC 4.50 4.40 - 11.00 10*3/uL   RBC 5.04 4.10 - 5.10 10*6/uL   Hemoglobin 14.2 12.3 - 15.3 g/dL   Hematocrit 58.0 64.0 - 44.6 %   Mean Corpuscular Volume (MCV) 83.2 80.0 - 96.0 fL   Mean Corpuscular Hemoglobin (MCH) 28.1 27.5 - 33.2 pg   Mean Corpuscular Hemoglobin Conc (MCHC) 33.8 33.0 - 37.0 g/dL   Red Cell Distribution Width (RDW) 14.8 12.3 - 17.0 %   Platelet Count (PLT) 155 150 - 450 10*3/uL   Mean Platelet Volume (MPV) 9.0 6.8 - 10.2 fL   Neutrophils % 56 %   Lymphocytes % 30 %   Monocytes % 9 %   Eosinophils % 5 %   Basophils % 1 %   nRBC % 0 %   Neutrophils Absolute 2.50 1.80 - 7.80 10*3/uL   Lymphocytes # 1.30 1.00 - 4.80 10*3/uL   Monocytes # 0.40 0.00 - 0.80 10*3/uL   Eosinophils # 0.20 0.00 - 0.50 10*3/uL   Basophils # 0.00 0.00 - 0.20 10*3/uL   nRBC Absolute 0.00 <=0.00 10*3/uL    Tumor marker pattern over time has been the following:  Lab Results  Component Value Date   CHROMGRNA 183.7 (H) 04/21/2024   CHROMGRNA 353.9 (H) 02/25/2024   CHROMGRNA 2490.0 (H) 01/23/2024     IMAGING STUDIES: Radiology Results (last 14 days)     Procedure Component Value Units Date/Time   NM Consultation/Notes [[id number]] Collected: 05/12/24 1516   Order Status: Completed Updated: 05/12/24 1624   Narrative:     NM LUTATHERA NEW PATIENT CONSULT VISIT May 12, 2024 INDICATION: Dx: Neuroendocrine tumor of pancreas (CMD) [D3A.8 (ICD-10-CM)]  HISTORY: Past medical history significant for breast cancer 2017 January 2025, pancreatic mass and liver metastases incidentally discovered during rib fracture evaluation after a fall. December 19, 2023, pancreatic tail mass, FNA-Outside case [facility]. Clusters and sheaths of malignant cells consistent with carcinoma February 2025, Lanreotide injections initiated January 02, 2024,  Liver biopsy, well differentiated neuroendocrine tumor,  G3 January 17, 2024, PET tumor imaging with DOTATATE. Right and left hepatic DOTATATE avid metastasis. DOTATATE avid pancreatic tail lesion compatible with neuroendocrine tumor   Impression:  April 20, 2024, CT chest abdomen pelvis with contrast. IMPRESSION: CONCLUSION:  1. Enlarging pancreatic neuroendocrine tumor. 2. Enlarging multiple hepatic metastasis. 3. Possible slow small bowel transit related stool in the distal small bowel. 4. No suspicious remote mass, nodule, nodes, effusion or ascites. April 21, 2024, Oncology discussed and recommended cabozantinib based off of the CABINET Trial.     Cindy Arias is a pleasant 78 yr old female who presents today to discuss Lutathera therapy for her neuroendocrine cancer and is accompanied by her friend Dr. Rollene Searle. She has been kindly referred by Dr. Darrelyn from Oncology. She had an evaluation for rib fractures after having a fall and it was found she had pancreatic mass and liver metastasis.  She had FNA of pancreases which demonstrated malignant cells consistent with carcinoma. She then had a liver biopsy in March 2025, which demonstrated well differentiated neuroendocrine tumor Grade 3. She started on monthly Lanreotide injections in February 2025. April 20, 2024, CT abdomen pelvis with contrast showed progression of disease.  She was then referred to Nuclear Medicine to discuss Lutathera therapy.    She denies diarrhea, flushing or abdominal pain. She is able to complete all ADL's without assistance.  She lives at home with her husband and is able to have her own bedroom and bathroom to observe radiation safety precautions.  She had good support group of friends, family and neighbors.     January 17, 2024, PET TUMOR IMAGING WITH DOTATATE:IMPRESSION: CONCLUSION: 1.Right and left hepatic DOTATATE-avid metastases. 2. DOTATATE avid pancreatic tail lesions compatible with neuroendocrine tumor.  Ki 67: 35% on pathology of liver biopsy January 02, 2024,   VITAL SIGNS: Blood Pressure:149/80 O2 Saturation:96% Heart Rate: 71 Weight:114 pounds  EXAMINATION: General Appearance: Well-developed, well-nourished, appropriate mood and affect. Able to ambulate without walking assistance. Lungs: Clear bilaterally to auscultation without diminished breath sounds. No signs of respiratory distress on room air. Cardiac:RRR Abdomen: Nondistended, active bowel sounds Extremities: Radial pulses intact, no signs of peripheral edema Neurological: Oriented to person, place and time  LABS: April 21, 2024, CBC with differential, WBS 6.60 hemoglobin13.7, platelets222, absolute neutrophil count4.00 April 21, 2024, Comprehensive metabolic panel, creatinine 0.73, albumin4.7, Cindy Arias, [id number] April 21, 2024, Chromogranin 183.7,  DISCUSSION: We discussed the benefit of LU-177 DOTATATE therapy is to prolong time to progression of disease with relatively few patients experiencing decrease in size or resolution of their cancer. The major risks of therapy, to include leukopenia/infection, thrombocytopenia/bleeding, anemia, decreased kidney function and hepatotoxicity were reviewed. We discussed that often but not always these abnormalities improved on discontinuation of therapy. Other conditions that may develop as a direct result of treatment with Lutathera are myelosuppression, as well as blood and bone marrow cancers known as secondary myelodysplastic syndrome and leukemia. It was made clear to the patient that we would be monitoring CBC and CMP lab values every 4 weeks after each therapy. Carcinoid crisis was discussed with the patient and, although it was rare, if it should occur then the patient may need to be admitted to the hospital for further monitoring. The radiation safety precautions necessary for 72 hours after  each treatment were reviewed.  Patient indicates able to follow these without difficulty. Patient is interested in the therapy. Patient will  review the literature provided regarding the therapy and contact us  with any additional questions.  Patient scheduled to receive monthly injection Lanreotide on May 19, 2024  ASSESSMENT & PLAN  After today's consult, review of chart,  imaging, pathology, labs  and physical examination of patient we can offer first Lutathera therapy.  Patient will have post Lutathera therapy Spect imaging the following day after therapy.   Given Ki67 is 35% recommend repeat Dotatate imaging after patient has completed 2 Lutathera therapies to assess treatment response.   Patient will have repeat CBC with differential, comprehensive metabolic panel labs drawn on May 19, 2024.  Patient is scheduled to receive Lanreotide monthly injection on May 19, 2024.  Nuclear medicine therapy coordinator will reach out to patient with first Lutathera therapy date.  ECOG Status 1   NP Attestation: 100% of the 45 minutes by Karna Hamburg NP-C involved face-to-face and non-face-to-face activities for this patient on the day of the visit. Professional time spent included reviewing recent lab tests, imaging and referring physician notes, in addition to those noted in documentation. Activities also included counseling and coordination of care for the patient and performing physical examination of the patient. This case was discussed in detail with Dr. Marita, Dr. Edward, Dr. Devora and Radiology residents reviewing patients chart, imaging and labs.            PATHOLOGY: Slides for consultation ([id number] , 01/02/2024)   Liver, biopsy (A1, A2):              Well differentiated neuroendocrine tumor, G3.   ASSESSMENT/PLAN: Cindy Arias is a 77 y.o. female with pancreatic and liver lesions presenting for medical oncology evaluation.   Pancreatic Neuroendocrine Tumor I discussed the natural history of pancreatic cancer with Cindy Arias at length, focusing on her case in particular. We reviewed her  history, labs, imaging, and pathology at length, explaining she has a stage IV malignancy.  After initial visit, pathology did confirm pancreatic neuroendocrine tumor with a Ki-67 of 15 to 30%. We discussed the treatment options at length and would recommend treatment with lanreotide.  Discussed side effects at length and will plan for monthly injections.  She was seen on 04/21/2024 with imaging which unfortunately shows progressive disease.  We discussed the implications of this and that she likely needs more aggressive therapy.  Given she has well-differentiated grade 3 tumor with progressive disease at this time, I would recommend treatment with cabozantinib 40 mg daily based off the CABINET trial.  Side effects were reviewed and she signed consent on 04/21/2024.  -Progressive disease on imaging on 04/20/24 and cabozantinib started at 40mg  daily. This was stopped today, 05/19/24, since she has had lutathera consult in the interim on 05/14/24 and is scheduled for first treatment in August.  -Continue lanreotide, given today - Will continue to coordinate lanreotide with lutathera treatments, will plan to see her back in the clinic with next lanreotide injection   The options of clinical trials were discussed. However, we do not have an open trial that the patient would be a candidate for.  By the end of the visit, I believe I answered all the patient's questions and concerns. Contact information for the cancer center was given and she knows to call if any concerns arise.    This is patient of Dr. Darrelyn.  Electronically signed by:  April Macario Kitty, PA-C, 05/19/2024 4:30 PM

## 2024-05-20 ENCOUNTER — Encounter (HOSPITAL_BASED_OUTPATIENT_CLINIC_OR_DEPARTMENT_OTHER): Payer: Self-pay | Admitting: Physician Assistant

## 2024-05-20 ENCOUNTER — Ambulatory Visit (HOSPITAL_BASED_OUTPATIENT_CLINIC_OR_DEPARTMENT_OTHER): Admitting: Physician Assistant

## 2024-05-20 ENCOUNTER — Ambulatory Visit (HOSPITAL_BASED_OUTPATIENT_CLINIC_OR_DEPARTMENT_OTHER)

## 2024-05-20 DIAGNOSIS — M25511 Pain in right shoulder: Secondary | ICD-10-CM

## 2024-05-20 DIAGNOSIS — G8929 Other chronic pain: Secondary | ICD-10-CM

## 2024-05-20 MED ORDER — LIDOCAINE HCL 1 % IJ SOLN
5.0000 mL | INTRAMUSCULAR | Status: AC | PRN
  Administered 2024-05-20: 5 mL

## 2024-05-20 MED ORDER — METHYLPREDNISOLONE ACETATE 40 MG/ML IJ SUSP
40.0000 mg | INTRAMUSCULAR | Status: AC | PRN
  Administered 2024-05-20: 40 mg via INTRA_ARTICULAR

## 2024-05-20 NOTE — Progress Notes (Signed)
 Office Visit Note   Patient: Cindy Arias           Date of Birth: 08/24/1946           MRN: [id number] Visit Date: 05/20/2024              Requested by: Cindy Harvey, MD [address] [place],  [place] [address] PCP: Cindy Harvey, MD  Chief Complaint  Patient presents with  . Right Shoulder - Pain      HPI: Cindy Arias is a pleasant 78 year old woman who is a previous longtime patient of Dr. Anderson.  She has a history of a right rotator cuff tear full-thickness with retraction.  She has treated this mostly with injections.  She comes in to say that she is having pain in her right shoulder has difficulty raising it above her head no paresthesias at night she gets throbbing pain she is currently in treatment for metastatic pancreatic cancer.  She thinks her right shoulder may have been aggravated during a recent mammogram when she was placing her arm up on the rest.  Assessment & Plan: Visit Diagnoses:  1. Right shoulder pain, unspecified chronicity     Plan: Exam today consistent more with rotator cuff issues and more than anything.  She has some decreased range of motion secondary to pain positive empty can test and impingement findings.  Will go forward with a subacromial injection today we did clear this with her oncologist to make sure it does not interfere with any upcoming treatment she may follow-up with me as needed  Follow-Up Instructions: Return if symptoms worsen or fail to improve.   Ortho Exam  Patient is alert, oriented, no adenopathy, well-dressed, normal affect, normal respiratory effort. Exam of the right shoulder she does have passive full forward elevation though somewhat painful after about 160 degrees.  She has good biceps triceps strength no sign of asymmetry in her biceps.  Her abductor strength is fairly well intact no pain with external rotation she has a positive decant test positive impingement findings mildly positive speeds  sign    Imaging: No results found. No images are attached to the encounter.  Labs: Lab Results  Component Value Date   REPTSTATUS 10/28/2018 FINAL 10/27/2018   CULT  10/27/2018    NO GROWTH Performed at [facility], [address], [place], [place] [address]      Lab Results  Component Value Date   ALBUMIN 4.7 12/11/2023   ALBUMIN 4.1 08/29/2021   ALBUMIN 3.9 08/29/2020    No results found for: MG Lab Results  Component Value Date   VD25OH 69.0 01/28/2018   VD25OH 45.5 04/29/2017    No results found for: PREALBUMIN    Latest Ref Rng & Units 12/20/2023    7:04 AM 12/11/2023   11:03 AM 11/15/2023    9:07 AM  CBC EXTENDED  WBC 4.0 - 10.5 K/uL 5.6  7.6  7.4   RBC 3.87 - 5.11 MIL/uL 4.38  4.82  4.63   Hemoglobin 12.0 - 15.0 g/dL 87.4  86.4  86.4   HCT 36.0 - 46.0 % 38.6  42.1  41.1   Platelets 150 - 400 K/uL 223  308  251   NEUT# 1.7 - 7.7 K/uL  4.9    Lymph# 0.7 - 4.0 K/uL  2.0       There is no height or weight on file to calculate BMI.  Orders:  Orders Placed This Encounter  Procedures  .  DG Shoulder Right   No orders of the defined types were placed in this encounter.    Procedures: Large Joint Inj: R subacromial bursa on 05/20/2024 11:29 AM Indications: diagnostic evaluation and pain Details: 1.5 in posterior approach  Arthrogram: No  Medications: 5 mL lidocaine  1 %; 40 mg methylPREDNISolone  acetate 40 MG/ML Outcome: tolerated well, no immediate complications Procedure, treatment alternatives, risks and benefits explained, specific risks discussed. Consent was given by the patient and guardian. Immediately prior to procedure a time out was called to verify the correct patient, procedure, equipment, support staff and site/side marked as required.     Clinical Data: No additional findings.  ROS:  All other systems negative, except as noted in the HPI. Review of Systems  Objective: Vital Signs: LMP 02/13/2012   Specialty  Comments:  No specialty comments available.  PMFS History: Patient Active Problem List   Diagnosis Date Noted  . Genetic testing 12/20/2023  . Pancreatic cancer (HCC) 12/03/2023  . Pancreatic mass 11/12/2023  . Rib fractures 11/11/2023  . Chronic rhinitis 10/02/2023  . Other specified disorders of eustachian tube, bilateral 10/02/2023  . Impacted cerumen of both ears 10/02/2023  . Pain in right shoulder 04/17/2023  . Callus between toes 01/24/2022  . Hammertoe of second toe of right foot 01/11/2021  . Pain in right foot 01/05/2020  . Primary osteoarthritis of left knee 11/04/2018  . History of left knee replacement 11/04/2018  . Bilateral primary osteoarthritis of knee 02/21/2018  . 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 Malignant neoplasm of overlapping sites of right breast in female, estrogen receptor positive (HCC) 11/19/2016   Past Medical History:  Diagnosis Date  . Arthritis    bone on bone'  left knee  . Asthma    LAST FLARE UP 3 YRS AGO  . Breast cancer (HCC)    RIGHT BREAST  . Colitis   . Dysphagia   . Dyspnea    NONE IN 3 YRS  . Esophageal stricture   . GERD (gastroesophageal reflux disease)   . History of infertility   . History of radiation therapy 12/31/16- 01/21/17   Right Breast 42.56 Gy in 16 fractions  . Osteopenia   . Pneumonia   . Rosacea   . Ulcerative colitis (HCC)     Family History  Problem Relation Age of Onset  . Cancer Mother        Liver- 2003 Lymphoma  . Rheum arthritis Mother   . Non-Hodgkin's lymphoma Mother        dx. 60s  . Stroke Father   . Hypertension Maternal Grandmother   . Heart disease Maternal Grandmother   . Osteoporosis Maternal Grandmother   . Diabetes Maternal Grandfather        Type 2   . Heart disease Maternal Grandfather   . Heart disease Paternal Grandmother        d. 10s  . Melanoma Daughter 21       on face  . Anesthesia problems Neg Hx      Past Surgical History:  Procedure Laterality Date  . BREAST LUMPECTOMY WITH RADIOACTIVE SEED AND SENTINEL LYMPH NODE BIOPSY Right 11/21/2016   Procedure: BREAST LUMPECTOMY WITH RADIOACTIVE SEED AND SENTINEL LYMPH NODE BIOPSY;  Surgeon: Cindy Olives, MD;  Location: [facility];  Service: General;  Laterality: Right;  . BREAST SURGERY     RIGHT BREAST LUMPECTOMY  . BUNIONECTOMY WITH HAMMERTOE RECONSTRUCTION Left 07/2014  .  CATARACT EXTRACTION Right 10/2015  . ESOPHAGEAL DILATION    . ESOPHAGOGASTRODUODENOSCOPY N/A 11/25/2023   Procedure: ESOPHAGOGASTRODUODENOSCOPY (EGD);  Surgeon: Burnette Fallow, MD;  Location: [facility];  Service: Gastroenterology;  Laterality: N/A;  . EYE SURGERY     OD  . FINE NEEDLE ASPIRATION N/A 11/25/2023   Procedure: FINE NEEDLE ASPIRATION (FNA) LINEAR;  Surgeon: Burnette Fallow, MD;  Location: [facility];  Service: Gastroenterology;  Laterality: N/A;  . IR IMAGING GUIDED PORT INSERTION  12/20/2023  . JOINT REPLACEMENT    . Repair of Chiari Malformation  02/05/13  . SUBOCCIPITAL CRANIECTOMY CERVICAL LAMINECTOMY  02/13/2012   Procedure: SUBOCCIPITAL CRANIECTOMY CERVICAL LAMINECTOMY/DURAPLASTY;  Surgeon: Reyes JONETTA Budge, MD;  Location: [facility];  Service: Neurosurgery;  Laterality: N/A;  Suboccipital craniectomy with upper cervical laminectomy and duraplasty  . TOTAL KNEE ARTHROPLASTY Left 11/04/2018  . TOTAL KNEE ARTHROPLASTY Left 11/04/2018   Procedure: LEFT TOTAL KNEE ARTHROPLASTY;  Surgeon: Cindy Arias Maude ORN, MD;  Location: [facility];  Service: Orthopedics;  Laterality: Left;  . TUBAL LIGATION  1987  . UPPER ESOPHAGEAL ENDOSCOPIC ULTRASOUND (EUS) N/A 11/25/2023   Procedure: UPPER ESOPHAGEAL ENDOSCOPIC ULTRASOUND (EUS);  Surgeon: Burnette Fallow, MD;  Location: [facility];  Service: Gastroenterology;  Laterality: N/A;   Social History   Occupational History  . Not on file  Tobacco Use  . Smoking status: Never  . Smokeless tobacco: Never   . Tobacco comments:    she smoked for a few months in college  Vaping Use  . Vaping status: Never Used  Substance and Sexual Activity  . Alcohol use: Yes    Comment: 1 glass of wine monthly  . Drug use: No    Comment: Smoked in college ~ 2 years  . Sexual activity: Not on file

## 2024-07-27 ENCOUNTER — Other Ambulatory Visit (HOSPITAL_BASED_OUTPATIENT_CLINIC_OR_DEPARTMENT_OTHER): Payer: Self-pay

## 2024-07-27 MED ORDER — FLUZONE HIGH-DOSE 0.5 ML IM SUSY
0.5000 mL | PREFILLED_SYRINGE | Freq: Once | INTRAMUSCULAR | 0 refills | Status: AC
  Filled 2024-07-27: qty 0.5, 1d supply, fill #0

## 2024-07-27 MED ORDER — COMIRNATY 30 MCG/0.3ML IM SUSY
0.3000 mL | PREFILLED_SYRINGE | Freq: Once | INTRAMUSCULAR | 0 refills | Status: AC
  Filled 2024-07-27: qty 0.3, 1d supply, fill #0

## 2024-08-04 ENCOUNTER — Ambulatory Visit (INDEPENDENT_AMBULATORY_CARE_PROVIDER_SITE_OTHER): Payer: Medicare Other | Admitting: Allergy and Immunology

## 2024-08-04 VITALS — BP 138/64 | HR 66 | Temp 97.2°F | Resp 14 | Ht 58.47 in | Wt 112.8 lb

## 2024-08-04 DIAGNOSIS — J4540 Moderate persistent asthma, uncomplicated: Secondary | ICD-10-CM | POA: Diagnosis not present

## 2024-08-04 DIAGNOSIS — J3089 Other allergic rhinitis: Secondary | ICD-10-CM | POA: Diagnosis not present

## 2024-08-04 DIAGNOSIS — K219 Gastro-esophageal reflux disease without esophagitis: Secondary | ICD-10-CM

## 2024-08-04 MED ORDER — LEVOCETIRIZINE DIHYDROCHLORIDE 5 MG PO TABS: 5.0000 mg | ORAL_TABLET | Freq: Every day | ORAL | 3 refills | Status: AC | PRN

## 2024-08-04 MED ORDER — BUDESONIDE-FORMOTEROL FUMARATE 160-4.5 MCG/ACT IN AERO: 2.0000 | INHALATION_SPRAY | Freq: Two times a day (BID) | RESPIRATORY_TRACT | 3 refills | Status: AC

## 2024-08-04 MED ORDER — OMEPRAZOLE 40 MG PO CPDR: 40.0000 mg | DELAYED_RELEASE_CAPSULE | Freq: Every morning | ORAL | 3 refills | Status: AC

## 2024-08-04 NOTE — Progress Notes (Unsigned)
 [place] [facility] [place] [facility] [place] [facility] [place] [facility]   Follow-up Note  Referring Provider: Aisha Harvey, MD Primary Provider: Aisha Harvey, MD Date of Office Visit: 08/04/2024  Subjective:   Cindy Arias (DOB: 06/11/1946) is a 78 y.o. female who [facility] in re-evaluation of the following:  HPI: Cindy Arias returns to this clinic in evaluation of asthma, allergic rhinitis, LPR.  I last saw her in this clinic 06 August 2023.  She has really done very well with her asthma and has not required a systemic steroid to treat an exacerbation and rarely uses her Symbicort  in rescue mode while she is using it in a maintenance mode just 1 time per day.  And she has had very little issues with her upper airway.  Reflux is good as long as she remains on omeprazole .  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  Allergies as of 08/04/2024       Reactions   Penicillins Hives, Other (See Comments)   UNSPECIFIED CHILDHOOD REACTION  Had reaction as a child and as an adult. Had additional reaction but not clear on the specifics   Sulfa Antibiotics Hives   Albuterol Other (See Comments)   tremors        Medication List    acetaminophen  500 MG tablet Commonly known as: TYLENOL  Take 2 tablets (1,000 mg total) by mouth every 8 (eight) hours as needed.   budesonide -formoterol  160-4.5 MCG/ACT inhaler Commonly known as: Symbicort  Inhale 2 puffs into the lungs in the morning and at bedtime.   CALCIUM CITRATE-VITAMIN D  PO Take 1 tablet by mouth daily.   fluticasone  50 MCG/ACT nasal spray Commonly known as: FLONASE  Place 2 sprays into both nostrils daily as needed for allergies or rhinitis.   levalbuterol  45 MCG/ACT inhaler Commonly known as: XOPENEX  HFA Inhale 2 puffs into the lungs See admin  instructions. Every 4-6 hours prn for sob, cough   mesalamine  1.2 g EC tablet Commonly known as: LIALDA  Take 1.2 g by mouth daily with breakfast.   methocarbamol  750 MG tablet Commonly known as: ROBAXIN  Take 1 tablet (750 mg total) by mouth every 8 (eight) hours as needed for muscle spasms.   multivitamins with iron  Tabs tablet Take 1 tablet by mouth daily.   omeprazole  40 MG capsule Commonly known as: PRILOSEC Take 1 capsule (40 mg total) by mouth daily. Take 1 capsule by mouth 1-2 times a day   ondansetron  8 MG tablet Commonly known as: Zofran  Take 1 tablet (8 mg total) by mouth every 8 (eight) hours as needed for nausea or vomiting.   oxyCODONE  5 MG immediate release tablet Commonly known as: Oxy IR/ROXICODONE  Take 1-1.5 tablets (5-7.5 mg total) by mouth every 6 (six) hours as needed for breakthrough pain.   prochlorperazine  10 MG tablet Commonly known as: COMPAZINE  Take 1 tablet (10 mg total) by mouth every 6 (six) hours as needed for nausea or vomiting.   vitamin E  180 MG (400 UNITS) capsule Take 400 Units by mouth daily.    Past Medical History:  Diagnosis Date   Arthritis    bone on bone'  left knee   Asthma    LAST FLARE UP 3 YRS AGO   Breast cancer (HCC)    RIGHT BREAST   Colitis    Dysphagia    Dyspnea  NONE IN 3 YRS   Esophageal stricture    GERD (gastroesophageal reflux disease)    History of infertility    History of radiation therapy 12/31/16- 01/21/17   Right Breast 42.56 Gy in 16 fractions   Osteopenia    Pneumonia    Rosacea    Ulcerative colitis (HCC)     Past Surgical History:  Procedure Laterality Date   BREAST LUMPECTOMY WITH RADIOACTIVE SEED AND SENTINEL LYMPH NODE BIOPSY Right 11/21/2016   Procedure: BREAST LUMPECTOMY WITH RADIOACTIVE SEED AND SENTINEL LYMPH NODE BIOPSY;  Surgeon: Morene Olives, MD;  Location: [facility];  Service: General;  Laterality: Right;   BREAST SURGERY     RIGHT BREAST LUMPECTOMY    BUNIONECTOMY WITH HAMMERTOE RECONSTRUCTION Left 07/2014   CATARACT EXTRACTION Right 10/2015   ESOPHAGEAL DILATION     ESOPHAGOGASTRODUODENOSCOPY N/A 11/25/2023   Procedure: ESOPHAGOGASTRODUODENOSCOPY (EGD);  Surgeon: Burnette Fallow, MD;  Location: [facility];  Service: Gastroenterology;  Laterality: N/A;   EYE SURGERY     OD   FINE NEEDLE ASPIRATION N/A 11/25/2023   Procedure: FINE NEEDLE ASPIRATION (FNA) LINEAR;  Surgeon: Burnette Fallow, MD;  Location: [facility];  Service: Gastroenterology;  Laterality: N/A;   IR IMAGING GUIDED PORT INSERTION  12/20/2023   JOINT REPLACEMENT     Repair of Chiari Malformation  02/05/13   SUBOCCIPITAL CRANIECTOMY CERVICAL LAMINECTOMY  02/13/2012   Procedure: SUBOCCIPITAL CRANIECTOMY CERVICAL LAMINECTOMY/DURAPLASTY;  Surgeon: Reyes JONETTA Budge, MD;  Location: [facility];  Service: Neurosurgery;  Laterality: N/A;  Suboccipital craniectomy with upper cervical laminectomy and duraplasty   TOTAL KNEE ARTHROPLASTY Left 11/04/2018   TOTAL KNEE ARTHROPLASTY Left 11/04/2018   Procedure: LEFT TOTAL KNEE ARTHROPLASTY;  Surgeon: Anderson Maude ORN, MD;  Location: [facility];  Service: Orthopedics;  Laterality: Left;   TUBAL LIGATION  1987   UPPER ESOPHAGEAL ENDOSCOPIC ULTRASOUND (EUS) N/A 11/25/2023   Procedure: UPPER ESOPHAGEAL ENDOSCOPIC ULTRASOUND (EUS);  Surgeon: Burnette Fallow, MD;  Location: [facility];  Service: Gastroenterology;  Laterality: N/A;    Review of systems negative except as noted in HPI / PMHx or noted below:  Review of Systems  Constitutional: Negative.   HENT: Negative.    Eyes: Negative.   Respiratory: Negative.    Cardiovascular: Negative.   Gastrointestinal: Negative.   Genitourinary: Negative.   Musculoskeletal: Negative.   Skin: Negative.   Neurological: Negative.   Endo/Heme/Allergies: Negative.   Psychiatric/Behavioral: Negative.       Objective:   Vitals:   08/04/24 0924  BP: 138/64  Pulse: 66  Resp: 14  Temp: (!) 97.2 F  (36.2 C)  SpO2: 98%   Height: 4' 10.47 (148.5 cm)  Weight: 112 lb 12.8 oz (51.2 kg)   Physical Exam Constitutional:      Appearance: She is not diaphoretic.  HENT:     Head: Normocephalic.     Right Ear: Tympanic membrane, ear canal and external ear normal.     Left Ear: Tympanic membrane, ear canal and external ear normal.     Nose: Nose normal. No mucosal edema or rhinorrhea.     Mouth/Throat:     Pharynx: Uvula midline. No oropharyngeal exudate.  Eyes:     Conjunctiva/sclera: Conjunctivae normal.  Neck:     Thyroid: No thyromegaly.     Trachea: Trachea normal. No tracheal tenderness or tracheal deviation.  Cardiovascular:     Rate and Rhythm: Normal rate and regular rhythm.     Heart sounds: Normal heart sounds, S1 normal and  S2 normal. No murmur heard. Pulmonary:     Effort: No respiratory distress.     Breath sounds: Normal breath sounds. No stridor. No wheezing or rales.  Lymphadenopathy:     Head:     Right side of head: No tonsillar adenopathy.     Left side of head: No tonsillar adenopathy.     Cervical: No cervical adenopathy.  Skin:    Findings: No erythema or rash.     Nails: There is no clubbing.  Neurological:     Mental Status: She is alert.     Diagnostics: Spirometry was performed and demonstrated an FEV1 of 1.55 at 95 % of predicted.  Assessment and Plan:   1. Asthma, moderate persistent, well-controlled   2. Other allergic rhinitis   3. LPRD (laryngopharyngeal reflux disease)    1. Treat inflammation:   A. Symbicort  160-2 inhalations 1-2 times a day  2. Continue treatment for reflux:   A. Omeprazole  40 mg 1 time a day  3. Continue the following if needed:   A. antihistamine   B. Nasal saline  C. Symbicort  160 - 2 inhalations every 6 hours  4. Return to clinic in 1 year or earlier if problem   5. Influenza = Tamiflu. Covid = Paxlovid  Grayson is doing well from an airway and reflux standpoint on her current plan.  She has a very  good understanding of her disease state and how her medications work and appropriate dosing of her medications depending on disease activity.  I have refilled all of her medications for these issues and I will see her back in this clinic in 1 year or earlier if there is a problem.  Camellia Denis, MD Allergy / Immunology [facility]

## 2024-08-04 NOTE — Patient Instructions (Addendum)
  1. Treat inflammation:   A. Symbicort  160-2 inhalations 1-2 times a day  2. Continue treatment for reflux:   A. Omeprazole  40 mg 1 time a day  3. Continue the following if needed:   A. antihistamine   B. Nasal saline  C. Symbicort  160 - 2 inhalations every 6 hours  4. Return to clinic in 1 year or earlier if problem   5. Influenza = Tamiflu. Covid = Paxlovid

## 2024-08-05 ENCOUNTER — Encounter: Payer: Self-pay | Admitting: Allergy and Immunology

## 2024-08-31 ENCOUNTER — Encounter: Payer: Self-pay | Admitting: Radiology

## 2024-09-07 ENCOUNTER — Other Ambulatory Visit: Payer: Self-pay | Admitting: Medical Genetics

## 2024-09-07 DIAGNOSIS — Z006 Encounter for examination for normal comparison and control in clinical research program: Secondary | ICD-10-CM

## 2024-10-01 ENCOUNTER — Ambulatory Visit (INDEPENDENT_AMBULATORY_CARE_PROVIDER_SITE_OTHER): Payer: Medicare Other | Admitting: Otolaryngology

## 2024-10-01 ENCOUNTER — Encounter (INDEPENDENT_AMBULATORY_CARE_PROVIDER_SITE_OTHER): Payer: Self-pay | Admitting: Otolaryngology

## 2024-10-01 VITALS — BP 122/71 | HR 69 | Ht 59.0 in | Wt 109.0 lb

## 2024-10-01 DIAGNOSIS — H6123 Impacted cerumen, bilateral: Secondary | ICD-10-CM | POA: Diagnosis not present

## 2024-10-01 DIAGNOSIS — H6983 Other specified disorders of Eustachian tube, bilateral: Secondary | ICD-10-CM

## 2024-10-01 DIAGNOSIS — J310 Chronic rhinitis: Secondary | ICD-10-CM

## 2024-10-01 DIAGNOSIS — H903 Sensorineural hearing loss, bilateral: Secondary | ICD-10-CM | POA: Diagnosis not present

## 2024-10-01 NOTE — Progress Notes (Signed)
 Patient ID: Cindy Arias, female   DOB: 10-26-1946, 78 y.o.   MRN: [id number]  Follow up: Chronic nasal congestion, eustachian tube dysfunction, hearing loss   History of Present Illness Cindy Arias is a 78 year old female who returns today for her follow-up evaluation.  She has a history of chronic nasal congestion, bilateral hearing loss, and bilateral eustachian tube dysfunction.  She was recently diagnosed with neuroendocrine pancreatic cancer, discovered incidentally after a fall that resulted in several broken ribs. A CT scan with contrast at that time revealed the cancer. She is currently undergoing treatment with Lutathera, a radioactive isotope, at [facility]. She reports that the cancer has spread to her liver, making surgery not an option, and that her recent PET scan showed the cancer cells are smaller and have not traveled. No liver problems, jaundice, or compressive symptoms related to her cancer.  She has a history of asthma and uses Flonase  as needed for occasional congestion, especially when exposed to dust, such as when handling Christmas decorations. She also uses saline in the mornings and keeps it by her bed for nighttime use. No current ear clogging or eustachian tube problems.  She denies any otalgia, otorrhea, facial pain, or fever.  Exam: General: Communicates without difficulty, well nourished, no acute distress. Head: Normocephalic, no evidence injury, no tenderness, facial buttresses intact without stepoff. Face/sinus: No tenderness to palpation and percussion. Facial movement is normal and symmetric. Eyes: PERRL, EOMI. No scleral icterus, conjunctivae clear. Neuro: CN II exam reveals vision grossly intact.  No nystagmus at any point of gaze. Ears: Auricles well formed without lesions.  Bilateral cerumen impaction.  Nose: External evaluation reveals normal support and skin without lesions.  Dorsum is intact.  Anterior rhinoscopy reveals congested mucosa over anterior  aspect of inferior turbinates and intact septum.  No purulence noted. Oral:  Oral cavity and oropharynx are intact, symmetric, without erythema or edema.  Mucosa is moist without lesions. Neck: Full range of motion without pain.  There is no significant lymphadenopathy.  No masses palpable.  Thyroid bed within normal limits to palpation.  Parotid glands and submandibular glands equal bilaterally without mass.  Trachea is midline. Neuro:  CN 2-12 grossly intact.   Procedure: Bilateral cerumen disimpaction Anesthesia: None Description: Under the operating microscope, the cerumen is carefully removed with a combination of cerumen currette, alligator forceps, and suction catheters.  After the cerumen is removed, the TMs are noted to be normal.  No mass, erythema, or lesions. The patient tolerated the procedure well.   Assessment & Plan Bilateral cerumen impaction -Otomicroscopy with bilateral cerumen disimpaction.  Chronic rhinitis Occasional congestion managed with Flonase  and saline. - Continue Flonase  nasal spray as needed for congestion. - Use saline nasal spray as needed for congestion.  Bilateral eustachian tube dysfunction - Clinically improved with medical treatment. - Continue Flonase  nasal spray 2 sprays each nostril daily.  Valsalva exercise daily.  Bilateral high-frequency sensorineural hearing loss - Subjectively stable. - Her ear canals, tympanic membranes, and middle ear spaces are normal.

## 2024-10-02 ENCOUNTER — Telehealth: Payer: Self-pay | Admitting: Physician Assistant

## 2024-10-02 NOTE — Telephone Encounter (Signed)
 Patient called and said she needs to follow up for her shoulder. Tried to get more information but she needs you to call her. CB#[phone]

## 2024-10-02 NOTE — Telephone Encounter (Signed)
 Patient had right shoulder injection in July. She was calling to see when she may be able to get another one. I told her that routinely it had to be atleast 3 months, so she should be good on the timeline. She is currently undergoing lututhera infusions for pancreatic cancer. I advised she would likely need clearance from her oncologist to be sure that the steroid would not interact prior to injection.  She will call back to advise.  She was hoping to speak with you and states that she hopes you are doing well.

## 2024-10-03 DIAGNOSIS — H903 Sensorineural hearing loss, bilateral: Secondary | ICD-10-CM | POA: Insufficient documentation

## 2024-10-13 LAB — GENECONNECT MOLECULAR SCREEN: Genetic Analysis Overall Interpretation: NEGATIVE

## 2024-11-11 ENCOUNTER — Other Ambulatory Visit: Payer: Self-pay | Admitting: Allergy and Immunology

## 2025-08-10 ENCOUNTER — Ambulatory Visit: Admitting: Allergy and Immunology
